# Patient Record
Sex: Male | Born: 1990
Health system: Western US, Academic
[De-identification: ages and names within clinical notes are randomized; demographics above are authoritative.]

## PROBLEM LIST (undated history)

## (undated) DIAGNOSIS — F909 Attention-deficit hyperactivity disorder, unspecified type: Secondary | ICD-10-CM

## (undated) DIAGNOSIS — F32A Depression, unspecified: Secondary | ICD-10-CM

## (undated) DIAGNOSIS — T1491XA Suicide attempt, initial encounter: Secondary | ICD-10-CM

## (undated) DIAGNOSIS — F329 Major depressive disorder, single episode, unspecified: Secondary | ICD-10-CM

## (undated) DIAGNOSIS — F419 Anxiety disorder, unspecified: Secondary | ICD-10-CM

## (undated) HISTORY — PX: DENTAL SURGERY: SHX609

---
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
## 2013-04-11 ENCOUNTER — Encounter (HOSPITAL_COMMUNITY): Payer: Self-pay | Admitting: *Deleted

## 2013-04-11 ENCOUNTER — Emergency Department (HOSPITAL_COMMUNITY): Admission: EM | Admit: 2013-04-11 | Payer: Self-pay | Source: Home / Self Care

## 2013-04-11 ENCOUNTER — Other Ambulatory Visit: Payer: Self-pay

## 2013-04-11 ENCOUNTER — Emergency Department (HOSPITAL_COMMUNITY)
Admission: EM | Admit: 2013-04-11 | Discharge: 2013-04-11 | Disposition: A | Discharge status: Home / Self Care | Payer: 59 | Attending: Emergency Medicine | Admitting: Emergency Medicine

## 2013-04-11 DIAGNOSIS — F17200 Nicotine dependence, unspecified, uncomplicated: Secondary | ICD-10-CM | POA: Insufficient documentation

## 2013-04-11 DIAGNOSIS — T43502A Poisoning by unspecified antipsychotics and neuroleptics, intentional self-harm, initial encounter: Secondary | ICD-10-CM | POA: Insufficient documentation

## 2013-04-11 DIAGNOSIS — T424X4A Poisoning by benzodiazepines, undetermined, initial encounter: Secondary | ICD-10-CM | POA: Insufficient documentation

## 2013-04-11 DIAGNOSIS — T465X1A Poisoning by other antihypertensive drugs, accidental (unintentional), initial encounter: Secondary | ICD-10-CM | POA: Insufficient documentation

## 2013-04-11 DIAGNOSIS — T50992A Poisoning by other drugs, medicaments and biological substances, intentional self-harm, initial encounter: Secondary | ICD-10-CM | POA: Insufficient documentation

## 2013-04-11 DIAGNOSIS — Z79899 Other long term (current) drug therapy: Secondary | ICD-10-CM | POA: Insufficient documentation

## 2013-04-11 DIAGNOSIS — F329 Major depressive disorder, single episode, unspecified: Secondary | ICD-10-CM

## 2013-04-11 DIAGNOSIS — R45851 Suicidal ideations: Secondary | ICD-10-CM

## 2013-04-11 DIAGNOSIS — F3289 Other specified depressive episodes: Secondary | ICD-10-CM | POA: Insufficient documentation

## 2013-04-11 DIAGNOSIS — T50901A Poisoning by unspecified drugs, medicaments and biological substances, accidental (unintentional), initial encounter: Secondary | ICD-10-CM

## 2013-04-11 DIAGNOSIS — F32A Depression, unspecified: Secondary | ICD-10-CM

## 2013-04-11 HISTORY — DX: Suicide attempt, initial encounter: T14.91XA

## 2013-04-11 HISTORY — DX: Attention-deficit hyperactivity disorder, unspecified type: F90.9

## 2013-04-11 HISTORY — DX: Anxiety disorder, unspecified: F41.9

## 2013-04-11 HISTORY — DX: Major depressive disorder, single episode, unspecified: F32.9

## 2013-04-11 HISTORY — DX: Depression, unspecified: F32.A

## 2013-04-11 LAB — COMPREHENSIVE METABOLIC PANEL
ALT: 20 U/L (ref 0–53)
AST: 18 U/L (ref 0–37)
Albumin: 3.4 g/dL — ABNORMAL LOW (ref 3.5–5.2)
Alkaline Phosphatase: 68 U/L (ref 39–117)
Potassium: 3.6 mEq/L (ref 3.5–5.1)
Sodium: 139 mEq/L (ref 135–145)
Total Protein: 6.3 g/dL (ref 6.0–8.3)

## 2013-04-11 LAB — CBC
HCT: 39.5 % (ref 39.0–52.0)
Hemoglobin: 13.8 g/dL (ref 13.0–17.0)
MCH: 31.3 pg (ref 26.0–34.0)
MCHC: 34.9 g/dL (ref 30.0–36.0)
MCV: 89.6 fL (ref 78.0–100.0)
Platelets: 274 K/uL (ref 150–400)
RBC: 4.41 MIL/uL (ref 4.22–5.81)
RDW: 12.5 % (ref 11.5–15.5)
WBC: 6.3 K/uL (ref 4.0–10.5)

## 2013-04-11 LAB — RAPID URINE DRUG SCREEN, HOSP PERFORMED
Amphetamines: NOT DETECTED
Barbiturates: NOT DETECTED
Benzodiazepines: NOT DETECTED
Cocaine: NOT DETECTED
Tetrahydrocannabinol: POSITIVE — AB

## 2013-04-11 LAB — URINALYSIS, ROUTINE W REFLEX MICROSCOPIC
Bilirubin Urine: NEGATIVE
Ketones, ur: NEGATIVE mg/dL
Nitrite: NEGATIVE
pH: 6 (ref 5.0–8.0)

## 2013-04-11 LAB — ACETAMINOPHEN LEVEL: Acetaminophen (Tylenol), Serum: <15 ug/mL (ref 10–30)

## 2013-04-11 MED ORDER — ACETAMINOPHEN 325 MG PO TABS: 650.0000 mg | ORAL_TABLET | ORAL | Status: DC | PRN

## 2013-04-11 MED ORDER — ALBUTEROL SULFATE HFA 108 (90 BASE) MCG/ACT IN AERS: 2.0000 | INHALATION_SPRAY | RESPIRATORY_TRACT | Status: DC | PRN

## 2013-04-11 MED ORDER — IBUPROFEN 200 MG PO TABS: 600.0000 mg | ORAL_TABLET | Freq: Three times a day (TID) | ORAL | Status: DC | PRN

## 2013-04-11 MED ORDER — AMMONIA AROMATIC IN INHA
RESPIRATORY_TRACT | Status: AC
  Administered 2013-04-11: 1 mL
  Filled 2013-04-11: qty 10

## 2013-04-11 MED ORDER — ONDANSETRON HCL 4 MG PO TABS: 4.0000 mg | ORAL_TABLET | Freq: Three times a day (TID) | ORAL | Status: DC | PRN

## 2013-04-11 MED ORDER — NALOXONE HCL 1 MG/ML IJ SOLN
2.0000 mg | Freq: Once | INTRAMUSCULAR | Status: AC
  Administered 2013-04-11: 2 mg via INTRAVENOUS

## 2013-04-11 MED ORDER — NALOXONE HCL 1 MG/ML IJ SOLN
INTRAMUSCULAR | Status: AC
  Filled 2013-04-11: qty 2

## 2013-04-11 MED ORDER — ZOLPIDEM TARTRATE 5 MG PO TABS: 5.0000 mg | ORAL_TABLET | Freq: Every evening | ORAL | Status: DC | PRN

## 2013-04-11 NOTE — Progress Notes (Signed)
Per discussion with psychiatrist,pt psychiatrically stable for discharge home with pt parents. Per psychiatrist patient parents are very supportive and will help to ensure he follows up with Dr. Jennelle Human or the PA. Pt appointment at 11am on Thursday 04/12/2013.   Catha Gosselin, LCSW [phone]  ED CSW .04/11/2013 10:54am

## 2013-04-11 NOTE — ED Notes (Signed)
Bed: AV40 Expected date:  Expected time:  Means of arrival:  Comments: EMS, Overdose, ETOH and Klonipin
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
## 2013-04-11 NOTE — ED Provider Notes (Signed)
CSN: [id number]     Arrival date & time 04/11/13  0439 History   First MD Initiated Contact with Patient 04/11/13 0447     Chief Complaint  Patient presents with  . Drug Overdose  . Suicidal   level V caveat: Altered mental status HPI Patient was brought to the emergency department after he was found to be drinking alcohol this evening it is reported that he took 10 tablets of 0.1 mg clonidine and a total of 2 mg of Klonopin prior to calling EMS.  He is unable to telemetry why he took the clonidine and Klonopin.  Best guess is that the overdose was around 2:30 AM.   Past Medical History  Diagnosis Date  . Depression   . Anxiety   . ADHD (attention deficit hyperactivity disorder)    History reviewed. No pertinent past surgical history. No family history on file. History  Substance Use Topics  . Smoking status: Current Every Day Smoker  . Smokeless tobacco: Not on file  . Alcohol Use: Yes    Review of Systems  Unable to perform ROS: Mental status change    Allergies  Review of patient's allergies indicates no known allergies.  Home Medications   Current Outpatient Rx  Name  Route  Sig  Dispense  Refill  . albuterol (PROVENTIL HFA;VENTOLIN HFA) 108 (90 BASE) MCG/ACT inhaler   Inhalation   Inhale 2 puffs into the lungs every 6 (six) hours as needed. Shortness of breath/ wheezing          BP 103/64  Pulse 71  Temp(Src) 97.7 F (36.5 C) (Oral)  Resp 21  SpO2 97% Physical Exam  Nursing note and vitals reviewed. Constitutional: He is oriented to person, place, and time. He appears well-developed and well-nourished.  HENT:  Head: Normocephalic and atraumatic.  Eyes: EOM are normal.  Neck: Normal range of motion.  Cardiovascular: Normal rate, regular rhythm, normal heart sounds and intact distal pulses.   Pulmonary/Chest: Effort normal and breath sounds normal. No respiratory distress.  Abdominal: Soft. He exhibits no distension. There is no tenderness.   Genitourinary: Rectum normal.  Musculoskeletal: Normal range of motion.  Neurological: He is alert and oriented to person, place, and time.  arousable to ammonia packet  Skin: Skin is warm and dry.    ED Course  Procedures (including critical care time)  ECG interpretation   Date: 04/11/2013  Rate: 71  Rhythm: normal sinus rhythm  QRS Axis: normal  Intervals: normal  ST/T Wave abnormalities: normal  Conduction Disutrbances: none  Narrative Interpretation:   Old EKG Reviewed: no prior ecg     Labs Review Labs Reviewed  COMPREHENSIVE METABOLIC PANEL - Abnormal; Notable for the following:    Glucose, Bld 104 (*)    Albumin 3.4 (*)    Total Bilirubin 0.2 (*)    All other components within normal limits  SALICYLATE LEVEL - Abnormal; Notable for the following:    Salicylate Lvl <2.0 (*)    All other components within normal limits  URINE RAPID DRUG SCREEN (HOSP PERFORMED) - Abnormal; Notable for the following:    Tetrahydrocannabinol POSITIVE (*)    All other components within normal limits  GLUCOSE, CAPILLARY - Abnormal; Notable for the following:    Glucose-Capillary 101 (*)    All other components within normal limits  CBC  ACETAMINOPHEN LEVEL  URINALYSIS, ROUTINE W REFLEX MICROSCOPIC  ETHANOL   Imaging Review No results found.  MDM  No diagnosis found.   6:08 AM  Patient is much more alert now.  Psychiatry evaluation will be necessary given intentional overdose a history of intentional overdose before in the past.  Patient is approximately 4 and half hours status post ingestion.  He'll be watched on the maintenance of emergency department for another hour and a half and then if his blood pressure meds stable he'll be moved to the psychiatric side of our emergency department  Lyanne Co, MD 04/11/13 [phone]

## 2013-04-11 NOTE — ED Provider Notes (Signed)
Pt seen by psychiatry and felt that he can be d/ced home with his parents who will ensure f/u tomorrow with his psychiatrist.  Gwyneth Sprout, MD 04/11/13 1104
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
## 2013-04-11 NOTE — ED Notes (Signed)
Pt escorted to discharge window. Verbalized understanding discharge instructions. In no acute distress. Vitals reviewed and WDL.  

## 2013-04-24 ENCOUNTER — Ambulatory Visit (INDEPENDENT_AMBULATORY_CARE_PROVIDER_SITE_OTHER): Payer: 59 | Admitting: Internal Medicine

## 2013-04-24 VITALS — BP 108/68 | HR 88 | Temp 97.8°F | Resp 18 | Ht 69.5 in | Wt 136.3 lb

## 2013-04-24 DIAGNOSIS — F411 Generalized anxiety disorder: Secondary | ICD-10-CM

## 2013-04-24 DIAGNOSIS — F17200 Nicotine dependence, unspecified, uncomplicated: Secondary | ICD-10-CM

## 2013-04-24 DIAGNOSIS — J069 Acute upper respiratory infection, unspecified: Secondary | ICD-10-CM

## 2013-04-24 MED ORDER — ALBUTEROL SULFATE HFA 108 (90 BASE) MCG/ACT IN AERS: 2.0000 | INHALATION_SPRAY | RESPIRATORY_TRACT | Status: DC | PRN

## 2013-04-24 MED ORDER — FLUTICASONE PROPIONATE 50 MCG/ACT NA SUSP: 2.0000 | Freq: Every day | NASAL | Status: DC

## 2013-04-24 NOTE — Patient Instructions (Addendum)
Continue daily antihistamine Increase fluids Stop smoking. Upper Respiratory Infection, Adult An upper respiratory infection (URI) is also sometimes known as the common cold. The upper respiratory tract includes the nose, sinuses, throat, trachea, and bronchi. Bronchi are the airways leading to the lungs. Most people improve within 1 week, but symptoms can last up to 2 weeks. A residual cough may last even longer.  CAUSES Many different viruses can infect the tissues lining the upper respiratory tract. The tissues become irritated and inflamed and often become very moist. Mucus production is also common. A cold is contagious. You can easily spread the virus to others by oral contact. This includes kissing, sharing a glass, coughing, or sneezing. Touching your mouth or nose and then touching a surface, which is then touched by another person, can also spread the virus. SYMPTOMS  Symptoms typically develop 1 to 3 days after you come in contact with a cold virus. Symptoms vary from person to person. They may include:  Runny nose.  Sneezing.  Nasal congestion.  Sinus irritation.  Sore throat.  Loss of voice (laryngitis).  Cough.  Fatigue.  Muscle aches.  Loss of appetite.  Headache.  Low-grade fever. DIAGNOSIS  You might diagnose your own cold based on familiar symptoms, since most people get a cold 2 to 3 times a year. Your caregiver can confirm this based on your exam. Most importantly, your caregiver can check that your symptoms are not due to another disease such as strep throat, sinusitis, pneumonia, asthma, or epiglottitis. Blood tests, throat tests, and X-rays are not necessary to diagnose a common cold, but they may sometimes be helpful in excluding other more serious diseases. Your caregiver will decide if any further tests are required. RISKS AND COMPLICATIONS  You may be at risk for a more severe case of the common cold if you smoke cigarettes, have chronic heart disease  (such as heart failure) or lung disease (such as asthma), or if you have a weakened immune system. The very young and very old are also at risk for more serious infections. Bacterial sinusitis, middle ear infections, and bacterial pneumonia can complicate the common cold. The common cold can worsen asthma and chronic obstructive pulmonary disease (COPD). Sometimes, these complications can require emergency medical care and may be life-threatening. PREVENTION  The best way to protect against getting a cold is to practice good hygiene. Avoid oral or hand contact with people with cold symptoms. Wash your hands often if contact occurs. There is no clear evidence that vitamin C, vitamin E, echinacea, or exercise reduces the chance of developing a cold. However, it is always recommended to get plenty of rest and practice good nutrition. TREATMENT  Treatment is directed at relieving symptoms. There is no cure. Antibiotics are not effective, because the infection is caused by a virus, not by bacteria. Treatment may include:  Increased fluid intake. Sports drinks offer valuable electrolytes, sugars, and fluids.  Breathing heated mist or steam (vaporizer or shower).  Eating chicken soup or other clear broths, and maintaining good nutrition.  Getting plenty of rest.  Using gargles or lozenges for comfort.  Controlling fevers with ibuprofen or acetaminophen as directed by your caregiver.  Increasing usage of your inhaler if you have asthma. Zinc gel and zinc lozenges, taken in the first 24 hours of the common cold, can shorten the duration and lessen the severity of symptoms. Pain medicines may help with fever, muscle aches, and throat pain. A variety of non-prescription medicines are available to  treat congestion and runny nose. Your caregiver can make recommendations and may suggest nasal or lung inhalers for other symptoms.  HOME CARE INSTRUCTIONS   Only take over-the-counter or prescription medicines  for pain, discomfort, or fever as directed by your caregiver.  Use a warm mist humidifier or inhale steam from a shower to increase air moisture. This may keep secretions moist and make it easier to breathe.  Drink enough water and fluids to keep your urine clear or pale yellow.  Rest as needed.  Return to work when your temperature has returned to normal or as your caregiver advises. You may need to stay home longer to avoid infecting others. You can also use a face mask and careful hand washing to prevent spread of the virus. SEEK MEDICAL CARE IF:   After the first few days, you feel you are getting worse rather than better.  You need your caregiver's advice about medicines to control symptoms.  You develop chills, worsening shortness of breath, or brown or red sputum. These may be signs of pneumonia.  You develop yellow or brown nasal discharge or pain in the face, especially when you bend forward. These may be signs of sinusitis.  You develop a fever, swollen neck glands, pain with swallowing, or white areas in the back of your throat. These may be signs of strep throat. SEEK IMMEDIATE MEDICAL CARE IF:   You have a fever.  You develop severe or persistent headache, ear pain, sinus pain, or chest pain.  You develop wheezing, a prolonged cough, cough up blood, or have a change in your usual mucus (if you have chronic lung disease).  You develop sore muscles or a stiff neck. Document Released: 01/12/2001 Document Revised: 10/11/2011 Document Reviewed: 11/20/2010 [facility] Patient Information 2014 [place], [place]. Smoking Cessation Quitting smoking is important to your health and has many advantages. However, it is not always easy to quit since nicotine is a very addictive drug. Often times, people try 3 times or more before being able to quit. This document explains the best ways for you to prepare to quit smoking. Quitting takes hard work and a lot of effort, but you can do  it. ADVANTAGES OF QUITTING SMOKING  You will live longer, feel better, and live better.  Your body will feel the impact of quitting smoking almost immediately.  Within 20 minutes, blood pressure decreases. Your pulse returns to its normal level.  After 8 hours, carbon monoxide levels in the blood return to normal. Your oxygen level increases.  After 24 hours, the chance of having a heart attack starts to decrease. Your breath, hair, and body stop smelling like smoke.  After 48 hours, damaged nerve endings begin to recover. Your sense of taste and smell improve.  After 72 hours, the body is virtually free of nicotine. Your bronchial tubes relax and breathing becomes easier.  After 2 to 12 weeks, lungs can hold more air. Exercise becomes easier and circulation improves.  The risk of having a heart attack, stroke, cancer, or lung disease is greatly reduced.  After 1 year, the risk of coronary heart disease is cut in half.  After 5 years, the risk of stroke falls to the same as a nonsmoker.  After 10 years, the risk of lung cancer is cut in half and the risk of other cancers decreases significantly.  After 15 years, the risk of coronary heart disease drops, usually to the level of a nonsmoker.  If you are pregnant, quitting smoking will  improve your chances of having a healthy baby.  The people you live with, especially any children, will be healthier.  You will have extra money to spend on things other than cigarettes. QUESTIONS TO THINK ABOUT BEFORE ATTEMPTING TO QUIT You may want to talk about your answers with your caregiver.  Why do you want to quit?  If you tried to quit in the past, what helped and what did not?  What will be the most difficult situations for you after you quit? How will you plan to handle them?  Who can help you through the tough times? Your family? Friends? A caregiver?  What pleasures do you get from smoking? What ways can you still get pleasure if  you quit? Here are some questions to ask your caregiver:  How can you help me to be successful at quitting?  What medicine do you think would be best for me and how should I take it?  What should I do if I need more help?  What is smoking withdrawal like? How can I get information on withdrawal? GET READY  Set a quit date.  Change your environment by getting rid of all cigarettes, ashtrays, matches, and lighters in your home, car, or work. Do not let people smoke in your home.  Review your past attempts to quit. Think about what worked and what did not. GET SUPPORT AND ENCOURAGEMENT You have a better chance of being successful if you have help. You can get support in many ways.  Tell your family, friends, and co-workers that you are going to quit and need their support. Ask them not to smoke around you.  Get individual, group, or telephone counseling and support. Programs are available at [employer] and health centers. Call your local health department for information about programs in your area.  Spiritual beliefs and practices may help some smokers quit.  Download a "quit meter" on your computer to keep track of quit statistics, such as how long you have gone without smoking, cigarettes not smoked, and money saved.  Get a self-help book about quitting smoking and staying off of tobacco. LEARN NEW SKILLS AND BEHAVIORS  Distract yourself from urges to smoke. Talk to someone, go for a walk, or occupy your time with a task.  Change your normal routine. Take a different route to work. Drink tea instead of coffee. Eat breakfast in a different place.  Reduce your stress. Take a hot bath, exercise, or read a book.  Plan something enjoyable to do every day. Reward yourself for not smoking.  Explore interactive web-based programs that specialize in helping you quit. GET MEDICINE AND USE IT CORRECTLY Medicines can help you stop smoking and decrease the urge to smoke. Combining  medicine with the above behavioral methods and support can greatly increase your chances of successfully quitting smoking.  Nicotine replacement therapy helps deliver nicotine to your body without the negative effects and risks of smoking. Nicotine replacement therapy includes nicotine gum, lozenges, inhalers, nasal sprays, and skin patches. Some may be available over-the-counter and others require a prescription.  Antidepressant medicine helps people abstain from smoking, but how this works is unknown. This medicine is available by prescription.  Nicotinic receptor partial agonist medicine simulates the effect of nicotine in your brain. This medicine is available by prescription. Ask your caregiver for advice about which medicines to use and how to use them based on your health history. Your caregiver will tell you what side effects to look out for  if you choose to be on a medicine or therapy. Carefully read the information on the package. Do not use any other product containing nicotine while using a nicotine replacement product.  RELAPSE OR DIFFICULT SITUATIONS Most relapses occur within the first 3 months after quitting. Do not be discouraged if you start smoking again. Remember, most people try several times before finally quitting. You may have symptoms of withdrawal because your body is used to nicotine. You may crave cigarettes, be irritable, feel very hungry, cough often, get headaches, or have difficulty concentrating. The withdrawal symptoms are only temporary. They are strongest when you first quit, but they will go away within 10 14 days. To reduce the chances of relapse, try to:  Avoid drinking alcohol. Drinking lowers your chances of successfully quitting.  Reduce the amount of caffeine you consume. Once you quit smoking, the amount of caffeine in your body increases and can give you symptoms, such as a rapid heartbeat, sweating, and anxiety.  Avoid smokers because they can make you  want to smoke.  Do not let weight gain distract you. Many smokers will gain weight when they quit, usually less than 10 pounds. Eat a healthy diet and stay active. You can always lose the weight gained after you quit.  Find ways to improve your mood other than smoking. FOR MORE INFORMATION  www.smokefree.gov  Document Released: 07/13/2001 Document Revised: 01/18/2012 Document Reviewed: 10/28/2011 [facility] Patient Information 2014 [place], [place].

## 2013-04-24 NOTE — Progress Notes (Signed)
  Subjective:    Patient ID: Ruben [name], male    DOB: Sep 16, 1990, 22 y.o.   MRN: [id number]  HPI Started feeling bad last week with sore throat, cold symptoms, felt better then started feeling bad two days ago with sore throat, worse on left, cough, producing thick, yellow sputum. Congested, no headache. Fatigued, no shortness of breath, occasional wheezing, out of inhaler. No fever. Girlfriend has been sick.  Tried Mucinex without relief.  Ears hurt on inside, no ringing.  Review of Systems Smokes less than pack a day, less with illness. No drugs. Few beers every other night and on weekends.  Feeling a little better on meds for anxiety/depression. Has some anxiety attacks. Able to work. Denies suicidal ideation, extreme sadness, or homicidal ideation.    Objective:   Physical Exam  Constitutional: He is oriented to person, place, and time. He appears well-developed and well-nourished. No distress.  HENT:  Right Ear: Tympanic membrane and external ear normal.  Left Ear: Tympanic membrane and external ear normal.  Nose: Mucosal edema and rhinorrhea present.  Mouth/Throat: Posterior oropharyngeal edema and posterior oropharyngeal erythema present. No oropharyngeal exudate.  Bilateral ear canals with small amount redness inferior, just inside canal.  Neck: Normal range of motion. Neck supple. No thyromegaly present.  Cardiovascular: Normal rate, regular rhythm, normal heart sounds and intact distal pulses.   Pulmonary/Chest: Effort normal and breath sounds normal.  Lymphadenopathy:    He has no cervical adenopathy.  Neurological: He is alert and oriented to person, place, and time.  Skin: Skin is warm and dry.  Psychiatric: He has a normal mood and affect. His behavior is normal. Thought content normal.       Assessment & Plan:  Acute upper respiratory infections of unspecified site  Generalized anxiety disorder  1- URI- discussed diagnosis. Start Flonase 2 sprays in  each nostril, increase fluids, use OTC antihistamines, encouraged smoking cessation.  2- GAD- continue to follow up with psychiatrist. 3- Nicotine addiction- discussed quitting, provided written and verbal support, spent approximately 2 to 3 minutes discussing this.  Participated fully in this evaluation.I have reviewed and agree with documentation. Robert P. Merla Riches, M.D.

## 2013-04-24 NOTE — Progress Notes (Deleted)
  Subjective:    Patient ID: Ruben [name], male    DOB: 08/29/1990, 22 y.o.   MRN: [id number]  HPI    Review of Systems     Objective:   Physical Exam        Assessment & Plan:

## 2013-06-07 ENCOUNTER — Emergency Department (HOSPITAL_COMMUNITY)
Admission: EM | Admit: 2013-06-07 | Discharge: 2013-06-07 | Disposition: A | Discharge status: Home / Self Care | Payer: 59 | Attending: Emergency Medicine | Admitting: Emergency Medicine

## 2013-06-07 ENCOUNTER — Encounter (HOSPITAL_COMMUNITY): Payer: Self-pay | Admitting: Emergency Medicine

## 2013-06-07 DIAGNOSIS — F411 Generalized anxiety disorder: Secondary | ICD-10-CM | POA: Insufficient documentation

## 2013-06-07 DIAGNOSIS — T46901A Poisoning by unspecified agents primarily affecting the cardiovascular system, accidental (unintentional), initial encounter: Secondary | ICD-10-CM | POA: Insufficient documentation

## 2013-06-07 DIAGNOSIS — IMO0002 Reserved for concepts with insufficient information to code with codable children: Secondary | ICD-10-CM | POA: Insufficient documentation

## 2013-06-07 DIAGNOSIS — T50901A Poisoning by unspecified drugs, medicaments and biological substances, accidental (unintentional), initial encounter: Secondary | ICD-10-CM

## 2013-06-07 DIAGNOSIS — T465X1A Poisoning by other antihypertensive drugs, accidental (unintentional), initial encounter: Secondary | ICD-10-CM | POA: Insufficient documentation

## 2013-06-07 DIAGNOSIS — F3289 Other specified depressive episodes: Secondary | ICD-10-CM | POA: Insufficient documentation

## 2013-06-07 DIAGNOSIS — Y929 Unspecified place or not applicable: Secondary | ICD-10-CM | POA: Insufficient documentation

## 2013-06-07 DIAGNOSIS — F329 Major depressive disorder, single episode, unspecified: Secondary | ICD-10-CM | POA: Insufficient documentation

## 2013-06-07 DIAGNOSIS — Y9389 Activity, other specified: Secondary | ICD-10-CM | POA: Insufficient documentation

## 2013-06-07 DIAGNOSIS — F17200 Nicotine dependence, unspecified, uncomplicated: Secondary | ICD-10-CM | POA: Insufficient documentation

## 2013-06-07 DIAGNOSIS — Z79899 Other long term (current) drug therapy: Secondary | ICD-10-CM | POA: Insufficient documentation

## 2013-06-07 LAB — COMPREHENSIVE METABOLIC PANEL
AST: 25 U/L (ref 0–37)
Albumin: 3.8 g/dL (ref 3.5–5.2)
CO2: 23 mEq/L (ref 19–32)
Calcium: 9.3 mg/dL (ref 8.4–10.5)
Creatinine, Ser: 1.01 mg/dL (ref 0.50–1.35)
Potassium: 3.8 mEq/L (ref 3.5–5.1)
Sodium: 140 mEq/L (ref 135–145)
Total Protein: 6.7 g/dL (ref 6.0–8.3)

## 2013-06-07 LAB — RAPID URINE DRUG SCREEN, HOSP PERFORMED
Barbiturates: NOT DETECTED
Benzodiazepines: NOT DETECTED
Cocaine: NOT DETECTED
Tetrahydrocannabinol: POSITIVE — AB

## 2013-06-07 LAB — CBC
HCT: 42.1 % (ref 39.0–52.0)
Hemoglobin: 15 g/dL (ref 13.0–17.0)
RBC: 4.69 MIL/uL (ref 4.22–5.81)

## 2013-06-07 MED ORDER — SODIUM CHLORIDE 0.9 % IV BOLUS (SEPSIS)
1000.0000 mL | Freq: Once | INTRAVENOUS | Status: AC
  Administered 2013-06-07: 1000 mL via INTRAVENOUS

## 2013-06-07 NOTE — ED Notes (Signed)
Charge spoke to [facility], according to notes from Dr Denton Lank pts disposition can be set to discharge.

## 2013-06-07 NOTE — BH Assessment (Signed)
Assessment Note  Ruben [name] is a 22 y.o. male who presents to [facility] after ingesting approx 8-9 clonidine and 3 mg klonopin.  Pt denies SI, no intent or plan to harm self.  Pt says he an intense argument with his ex-girlfriend via texting and he was very upset and had a severe panic attack(SOB, racing thoughts, chest tightness) and took medication to help him calm down. Pt reports he has at least 2 panic attacks, daily.   Pt says he was out with friend at this time and thought he had taken too much and called 911.  Pt has no past SI attempts, no past inpt admissions.  Pt has outpatient services with Dr. Mat Carne Shughert(psych), no therapist.  Pt is able to contract for safety, currently lives with a roommate and has supportive friends.  This Clinical research associate discussed disposition with Dr. Denton Lank, agreed pt could be d/c'd home after appropriate observation due ingestion.  Axis I: Anxiety Disorder NOS and Mood Disorder NOS Axis II: Deferred Axis III:  Past Medical History  Diagnosis Date  . Depression   . Anxiety   . ADHD (attention deficit hyperactivity disorder)   . Suicide attempt    Axis IV: other psychosocial or environmental problems and problems related to social environment Axis V: 41-50 serious symptoms  Past Medical History:  Past Medical History  Diagnosis Date  . Depression   . Anxiety   . ADHD (attention deficit hyperactivity disorder)   . Suicide attempt     History reviewed. No pertinent past surgical history.  Family History:  Family History  Problem Relation Age of Onset  . Hypertension Father   . Cancer Paternal Grandfather     Social History:  reports that he has been smoking.  He does not have any smokeless tobacco history on file. He reports that he drinks alcohol. He reports that he uses illicit drugs (Marijuana).  Additional Social History:  Alcohol / Drug Use Pain Medications: See MAR  Prescriptions: See MAR Over the Counter: See MAR  History of alcohol / drug  use?: Yes Longest period of sobriety (when/how long): None  Negative Consequences of Use: Personal relationships;Work / Programmer, multimedia Withdrawal Symptoms: Other (Comment) (NO w/d sxs ) Substance #1 Name of Substance 1: THC  1 - Age of First Use: Teens 1 - Amount (size/oz): Unk  1 - Frequency: Wkly  1 - Duration: On-going  1 - Last Use / Amount: 1 wk ago   CIWA: CIWA-Ar BP: 95/61 mmHg Pulse Rate: 66 COWS:    Allergies:  Allergies  Allergen Reactions  . Abilify [Aripiprazole] Anaphylaxis    Pt states that medication gave him involuntary muscle spasms    Home Medications:  (Not in a hospital admission)  OB/GYN Status:  No LMP for male patient.  General Assessment Data Location of Assessment: [facility] Is this a Tele or Face-to-Face Assessment?: Tele Assessment Is this an Initial Assessment or a Re-assessment for this encounter?: Initial Assessment Living Arrangements: Other (Comment) (Lives w/roommate ) Can pt return to current living arrangement?: Yes Admission Status: Voluntary Is patient capable of signing voluntary admission?: Yes Transfer from: Acute Hospital Referral Source: MD  Medical Screening Exam [facility] Walk-in ONLY) Medical Exam completed: No Reason for MSE not completed: Other: (None )  [facility] Crisis Care Plan Living Arrangements: Other (Comment) (Lives w/roommate ) Name of Psychiatrist: Dr. Frederik Schmidt Name of Therapist: None   Education Status Is patient currently in school?: No Current Grade: None  Highest grade of school  patient has completed: None  Name of school: None  Contact person: None   Risk to self Suicidal Ideation: No Suicidal Intent: No Is patient at risk for suicide?: No Suicidal Plan?: No Access to Means: No What has been your use of drugs/alcohol within the last 12 months?: Pt uses THC wkly  Previous Attempts/Gestures: No How many times?: 0 Other Self Harm Risks: None  Triggers for Past Attempts: None known Intentional Self Injurious  Behavior: None Family Suicide History: No Recent stressful life event(s): Conflict (Comment) (Argument with girlfriend via text ) Persecutory voices/beliefs?: No Depression: No Depression Symptoms:  (None reported ) Substance abuse history and/or treatment for substance abuse?: No Suicide prevention information given to non-admitted patients: Not applicable  Risk to Others Homicidal Ideation: No Thoughts of Harm to Others: No Current Homicidal Intent: No Current Homicidal Plan: No Access to Homicidal Means: No Identified Victim: None  History of harm to others?: No Assessment of Violence: None Noted Violent Behavior Description: None  Does patient have access to weapons?: No Criminal Charges Pending?: No Does patient have a court date: No  Psychosis Hallucinations: None noted Delusions: None noted  Mental Status Report Appear/Hygiene: Disheveled Eye Contact: Poor Motor Activity: Unremarkable Speech: Logical/coherent;Soft;Slurred Level of Consciousness: Drowsy Mood: Sad Affect: Sad Anxiety Level: None Thought Processes: Coherent;Relevant Judgement: Unimpaired Orientation: Person;Place;Time;Situation Obsessive Compulsive Thoughts/Behaviors: None  Cognitive Functioning Concentration: Normal Memory: Recent Intact;Remote Intact IQ: Average Insight: Fair Impulse Control: Fair Appetite: Good Weight Loss: 0 Weight Gain: 0 Sleep: No Change Total Hours of Sleep: 7 Vegetative Symptoms: None  ADLScreening [facility] Assessment Services) Patient's cognitive ability adequate to safely complete daily activities?: Yes Patient able to express need for assistance with ADLs?: Yes Independently performs ADLs?: Yes (appropriate for developmental age)  Prior Inpatient Therapy Prior Inpatient Therapy: Yes Prior Therapy Dates: 2010 Prior Therapy Facilty/Provider(s): [facility]  Reason for Treatment: Depression   Prior Outpatient Therapy Prior Outpatient Therapy: Yes Prior Therapy Dates:  Current  Prior Therapy Facilty/Provider(s): Dr. Frederik Schmidt  Reason for Treatment: Med Mgt   ADL Screening (condition at time of admission) Patient's cognitive ability adequate to safely complete daily activities?: Yes Is the patient deaf or have difficulty hearing?: No Does the patient have difficulty seeing, even when wearing glasses/contacts?: No Does the patient have difficulty concentrating, remembering, or making decisions?: No Patient able to express need for assistance with ADLs?: Yes Does the patient have difficulty dressing or bathing?: No Independently performs ADLs?: Yes (appropriate for developmental age) Does the patient have difficulty walking or climbing stairs?: No Weakness of Legs: None Weakness of Arms/Hands: None  Home Assistive Devices/Equipment Home Assistive Devices/Equipment: None  Therapy Consults (therapy consults require a physician order) PT Evaluation Needed: No OT Evalulation Needed: No SLP Evaluation Needed: No Abuse/Neglect Assessment (Assessment to be complete while patient is alone) Physical Abuse: Denies Verbal Abuse: Denies Sexual Abuse: Denies Exploitation of patient/patient's resources: Denies Self-Neglect: Denies Values / Beliefs Cultural Requests During Hospitalization: None Spiritual Requests During Hospitalization: None Consults Spiritual Care Consult Needed: No Social Work Consult Needed: No Merchant navy officer (For Healthcare) Advance Directive: Patient does not have advance directive;Patient would not like information Pre-existing out of facility DNR order (yellow form or pink MOST form): No Nutrition Screen- [facility] Patient's home diet: Regular  Additional Information 1:1 In Past 12 Months?: No CIRT Risk: No Elopement Risk: No Does patient have medical clearance?: Yes     Disposition:  Disposition Initial Assessment Completed for this Encounter: Yes Disposition of Patient: Other dispositions (  Pt to be d/c'd  after appropriate observation ) Other disposition(s): Other (Comment) (pt to be d/c'd after appropriate observation by emerg dept )  On Site Evaluation by:   Reviewed with Physician:    Murrell Redden 06/07/2013 7:14 AM

## 2013-06-07 NOTE — ED Notes (Signed)
Poison control called to finish up with pts chart, requested pts last set of vitals. Charge told poison control pt was discharged home.

## 2013-06-07 NOTE — ED Notes (Signed)
Consult to TTS in process at this time.

## 2013-06-07 NOTE — ED Notes (Signed)
Brought in by EMS from home with c/o drug overdose.  Per pt, he has hx of panic attacks and he had one tonight and so, he took 10 tablets of Clonidine 0.1 mg--- states he realized his mistake and called EMS.  Pt reports he took the Clonidine tablets at around 0130.

## 2013-06-07 NOTE — Progress Notes (Signed)
CSW spoke with EDP regarding pt disposition. Per discussion, patient plans to be discharged once awake and eats something. Per EDP, poison control recommended observation for 6 hours. No further psychiatric evaluation needed at this time.   Catha Gosselin, LCSW [phone]  ED CSW .06/07/2013 826am

## 2013-06-07 NOTE — ED Provider Notes (Signed)
7:00 AM Accepted care from Dr. Denton Lank. Poison control recommending obs for 6 hrs.   8:59 AM: Pt has been here for 7 hr and continues to appear well on my exam, BP remains stable. Psych has seen and recommends d/c.  I have discussed the diagnosis/risks/treatment options with the patient and believe the pt to be eligible for discharge home to follow-up with pcp as needed. We also discussed returning to the ED immediately if new or worsening sx occur. We discussed the sx which are most concerning (e.g., SI) that necessitate immediate return. Any new prescriptions provided to the patient are listed below.  Clinical Impression 1. Accidental overdose, initial encounter      Junius Argyle, MD 06/07/13 1526

## 2013-06-07 NOTE — ED Notes (Addendum)
Note written on wrong patient.

## 2013-06-07 NOTE — ED Provider Notes (Addendum)
CSN: [id number]     Arrival date & time 06/07/13  0154 History   First MD Initiated Contact with Patient 06/07/13 0210     Chief Complaint  Patient presents with  . Drug Overdose   (Consider location/radiation/quality/duration/timing/severity/associated sxs/prior Treatment) Patient is a 22 y.o. male presenting with Overdose. The history is provided by the patient.  Drug Overdose Pertinent negatives include no chest pain, no abdominal pain, no headaches and no shortness of breath.  pt states was texting ex girlfriend tonight, when got upset, anxious, notes hx panic attacks, states reached for his med and took 5-6 clonidine, instead of his klonopin. Denies wanting to kill self, but states was anxious and upset. Since ingestion, states feels fine, no faintness or dizziness. Denies any other ingestion. States recent physical health at baseline.     Past Medical History  Diagnosis Date  . Depression   . Anxiety   . ADHD (attention deficit hyperactivity disorder)   . Suicide attempt    History reviewed. No pertinent past surgical history. Family History  Problem Relation Age of Onset  . Hypertension Father   . Cancer Paternal Grandfather    History  Substance Use Topics  . Smoking status: Current Every Day Smoker  . Smokeless tobacco: Not on file  . Alcohol Use: Yes    Review of Systems  Constitutional: Negative for fever.  HENT: Negative for sore throat.   Eyes: Negative for redness.  Respiratory: Negative for shortness of breath.   Cardiovascular: Negative for chest pain.  Gastrointestinal: Negative for abdominal pain.  Genitourinary: Negative for flank pain.  Musculoskeletal: Negative for back pain and neck pain.  Skin: Negative for rash.  Neurological: Negative for syncope, light-headedness and headaches.  Hematological: Does not bruise/bleed easily.  Psychiatric/Behavioral: The patient is nervous/anxious.     Allergies  Abilify and Abilify  Home Medications    Current Outpatient Rx  Name  Route  Sig  Dispense  Refill  . albuterol (PROVENTIL HFA;VENTOLIN HFA) 108 (90 BASE) MCG/ACT inhaler   Inhalation   Inhale 2 puffs into the lungs every 6 (six) hours as needed. Shortness of breath/ wheezing         . albuterol (PROVENTIL HFA;VENTOLIN HFA) 108 (90 BASE) MCG/ACT inhaler   Inhalation   Inhale 2 puffs into the lungs every 4 (four) hours as needed for wheezing (cough, shortness of breath or wheezing.).   1 Inhaler   1   . clonazePAM (KLONOPIN) 1 MG tablet   Oral   Take 1 mg by mouth 3 (three) times daily as needed for anxiety.          . cloNIDine (CATAPRES) 0.1 MG tablet   Oral   Take 0.1 mg by mouth 2 (two) times daily.          . fluticasone (FLONASE) 50 MCG/ACT nasal spray   Nasal   Place 2 sprays into the nose daily.   16 g   6   . PARoxetine (PAXIL) 40 MG tablet   Oral   Take 40 mg by mouth every morning.           BP 110/69  Pulse 104  Temp(Src) 98.1 F (36.7 C) (Oral)  Resp 16  SpO2 97% Physical Exam  Nursing note and vitals reviewed. Constitutional: He is oriented to person, place, and time. He appears well-developed and well-nourished. No distress.  HENT:  Head: Atraumatic.  Eyes: Conjunctivae are normal. Pupils are equal, round, and reactive to light.  Neck: Neck  supple. No tracheal deviation present.  Cardiovascular: Normal rate and normal heart sounds.   Pulmonary/Chest: Effort normal and breath sounds normal. No accessory muscle usage. No respiratory distress.  Abdominal: Soft. He exhibits no distension. There is no tenderness.  Musculoskeletal: Normal range of motion. He exhibits no edema.  Neurological: He is alert and oriented to person, place, and time.  Skin: Skin is warm and dry. He is not diaphoretic.  Psychiatric: He has a normal mood and affect.    ED Course  Procedures (including critical care time)  Results for orders placed during the hospital encounter of 06/07/13  CBC      Result  Value Range   WBC 6.5  4.0 - 10.5 K/uL   RBC 4.69  4.22 - 5.81 MIL/uL   Hemoglobin 15.0  13.0 - 17.0 g/dL   HCT 21.3  08.6 - 57.8 %   MCV 89.8  78.0 - 100.0 fL   MCH 32.0  26.0 - 34.0 pg   MCHC 35.6  30.0 - 36.0 g/dL   RDW 46.9  62.9 - 52.8 %   Platelets 251  150 - 400 K/uL  COMPREHENSIVE METABOLIC PANEL      Result Value Range   Sodium 140  135 - 145 mEq/L   Potassium 3.8  3.5 - 5.1 mEq/L   Chloride 104  96 - 112 mEq/L   CO2 23  19 - 32 mEq/L   Glucose, Bld 95  70 - 99 mg/dL   BUN 10  6 - 23 mg/dL   Creatinine, Ser 4.13  0.50 - 1.35 mg/dL   Calcium 9.3  8.4 - 24.4 mg/dL   Total Protein 6.7  6.0 - 8.3 g/dL   Albumin 3.8  3.5 - 5.2 g/dL   AST 25  0 - 37 U/L   ALT 28  0 - 53 U/L   Alkaline Phosphatase 75  39 - 117 U/L   Total Bilirubin 0.4  0.3 - 1.2 mg/dL   GFR calc non Af Amer >90  >90 mL/min   GFR calc Af Amer >90  >90 mL/min  URINE RAPID DRUG SCREEN (HOSP PERFORMED)      Result Value Range   Opiates NONE DETECTED  NONE DETECTED   Cocaine NONE DETECTED  NONE DETECTED   Benzodiazepines NONE DETECTED  NONE DETECTED   Amphetamines NONE DETECTED  NONE DETECTED   Tetrahydrocannabinol POSITIVE (*) NONE DETECTED   Barbiturates NONE DETECTED  NONE DETECTED  ETHANOL      Result Value Range   Alcohol, Ethyl (B) 148 (*) 0 - 11 mg/dL      EKG Interpretation   None       MDM  Iv ns. Labs.  Discussed w poison control, will obs/monitor vitals for next 6 hours.  Discussed w psych team - will assess.  Pt remains alert, content. No resp dep. bp normal.   Awaiting psych team eval and dispo.   Psych team has evaluated and states no acute psychosis, no delusions or hallucinations.  They indicate pt had no intent to harm or kill self, but had panic attack in response to stressful situation. They indicate pt psychiatrically stable for d/c.   On recheck pt, states he realizes it was stupid to take extra medication/overdose in response to stressful situation, and was intoxicated  at the time. He exhibits normal mood/affect, and denies any wish to harm self or others.   When pt sleeping, and under influence etoh, and bzd, bp transiently low. Pt  easily aroused. Given clonidine ingestion approx 4 hrs earlier, was given iv ns bolus, bp immediately responded.   Will observe in ed to ensure no recurrent hypotensive.  If bp remains normal, and pts psych condition remains stable, anticipate probable d/c later this morning.   Signed out to Dr Romeo Apple to reassess, recheck bp.     Suzi Roots, MD 06/07/13 [phone]

## 2013-10-26 ENCOUNTER — Ambulatory Visit (INDEPENDENT_AMBULATORY_CARE_PROVIDER_SITE_OTHER): Payer: 59 | Admitting: Physician Assistant

## 2013-10-26 VITALS — BP 128/60 | HR 75 | Temp 98.4°F | Resp 16 | Ht 69.0 in | Wt 132.0 lb

## 2013-10-26 DIAGNOSIS — F411 Generalized anxiety disorder: Secondary | ICD-10-CM

## 2013-10-26 DIAGNOSIS — F419 Anxiety disorder, unspecified: Secondary | ICD-10-CM

## 2013-10-26 DIAGNOSIS — J45909 Unspecified asthma, uncomplicated: Secondary | ICD-10-CM

## 2013-10-26 MED ORDER — ALBUTEROL SULFATE HFA 108 (90 BASE) MCG/ACT IN AERS: 2.0000 | INHALATION_SPRAY | RESPIRATORY_TRACT | Status: DC | PRN

## 2013-10-26 MED ORDER — VENLAFAXINE HCL ER 37.5 MG PO CP24: 37.5000 mg | ORAL_CAPSULE | Freq: Every day | ORAL | Status: DC
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
## 2013-11-20 ENCOUNTER — Telehealth: Payer: Self-pay

## 2013-11-20 DIAGNOSIS — F419 Anxiety disorder, unspecified: Secondary | ICD-10-CM

## 2013-11-20 NOTE — Telephone Encounter (Signed)
Glorious PeachAmanda Seavey, pt's psychologist, would like Benny LennertSarah Weber to call her regarding pt. Ruben SagoSarah may try to call anytime during the day, but may find it easier to reach [name] after 5 pm when her appts are finished.

## 2013-11-22 MED ORDER — VENLAFAXINE HCL ER 75 MG PO CP24: 75.0000 mg | ORAL_CAPSULE | Freq: Every day | ORAL | Status: DC

## 2013-11-22 MED ORDER — HYDROXYZINE HCL 25 MG PO TABS: 12.5000 mg | ORAL_TABLET | Freq: Two times a day (BID) | ORAL | Status: DC | PRN

## 2013-11-22 NOTE — Telephone Encounter (Signed)
I spoke with Glorious PeachAmanda Seavey and we will increase the patients Effexor dose (he is tolerating it well but not seeing an improvement in his anxiety).  We will also try Atarax for his break through anxiety.  meds were sent to the pharmacy.

## 2013-12-20 ENCOUNTER — Telehealth: Payer: Self-pay

## 2013-12-20 DIAGNOSIS — F419 Anxiety disorder, unspecified: Secondary | ICD-10-CM

## 2013-12-20 NOTE — Telephone Encounter (Signed)
Florene RouteSarah, Amanda from [facility] wants to know if you wouldn't mind giving her a call at your earliest convenience for a consult to discuss medications on this patient please. Thanks

## 2013-12-26 NOTE — Telephone Encounter (Signed)
Called and LMOM of Dr Alycia Rossetti.

## 2013-12-27 MED ORDER — CLONIDINE HCL 0.1 MG PO TABS: 0.1000 mg | ORAL_TABLET | Freq: Every evening | ORAL | Status: DC | PRN

## 2013-12-27 MED ORDER — VENLAFAXINE HCL ER 150 MG PO CP24: 150.0000 mg | ORAL_CAPSULE | Freq: Every day | ORAL | Status: DC

## 2013-12-27 NOTE — Telephone Encounter (Signed)
Marchelle Folks called to say that pt agreeable to medication changes

## 2013-12-27 NOTE — Telephone Encounter (Signed)
Spoke with Ruben [name].  Patient is still having intense anxiety that has not changed since starting the effexor.  He is still not sleeping at night due to can not turn off his thoughts.  He is partially compliant with therapy.  He will need an OV here before more refills.

## 2014-01-09 ENCOUNTER — Ambulatory Visit (INDEPENDENT_AMBULATORY_CARE_PROVIDER_SITE_OTHER): Payer: 59 | Admitting: Family Medicine

## 2014-01-09 VITALS — BP 118/70 | HR 115 | Temp 98.9°F | Resp 16 | Ht 69.0 in | Wt 142.0 lb

## 2014-01-09 DIAGNOSIS — J029 Acute pharyngitis, unspecified: Secondary | ICD-10-CM

## 2014-01-09 DIAGNOSIS — R059 Cough, unspecified: Secondary | ICD-10-CM

## 2014-01-09 DIAGNOSIS — R05 Cough: Secondary | ICD-10-CM

## 2014-01-09 LAB — POCT RAPID STREP A (OFFICE): RAPID STREP A SCREEN: NEGATIVE

## 2014-01-09 MED ORDER — PROMETHAZINE-DM 6.25-15 MG/5ML PO SYRP: 2.5000 mL | ORAL_SOLUTION | Freq: Four times a day (QID) | ORAL | Status: DC | PRN

## 2014-01-09 NOTE — Patient Instructions (Signed)
Cough syrup as prescribed- will make you sleepy. Increase fluids Use albuterol inhaler every 2 hours as needed while sick (for up to 24 hours) Can use afrin per package directions for up to 3 days Return if no improvement in 4-5 days or if worsening symptoms

## 2014-01-09 NOTE — Progress Notes (Signed)
   Subjective:    Patient ID: Ruben [name], male    DOB: [birth year], 23 y.o.   MRN: [id number]  HPI Patient presents today with sore throat and cough since yesterday. He has felt fatigued with some joint pain.Thick yellow-white nasal drainage and sputum. Has albuterol inhaler, has been using every 6 hours without relief. Prior to illness, he rarely uses inhaler. Has taken allegra without relief. Has some viscous lidocaine at home that helps some with throat pain.  Smokes a couple of cigarettes a day. Has had a sick contact- friend.  Is seeing a psychiatrist for his anxiety and feels this is helpful. Is seeing Anne Fu and a therapist Glorious Peach.  Review of Systems No fever, feels hot, no headache, no ear pain, no wheezing, feels short of breath, occasionally has chest heaviness- he has this with anxiety on occasion.    Objective:   Physical Exam  Vitals reviewed. Constitutional: He is oriented to person, place, and time. He appears well-developed and well-nourished. No distress.  Appears to not feel well, very frequent coughing.   HENT:  Head: Normocephalic and atraumatic.  Right Ear: Tympanic membrane, external ear and ear canal normal.  Left Ear: Tympanic membrane, external ear and ear canal normal.  Nose: Mucosal edema and rhinorrhea present. Right sinus exhibits no maxillary sinus tenderness and no frontal sinus tenderness. Left sinus exhibits no maxillary sinus tenderness and no frontal sinus tenderness.  Mouth/Throat: Posterior oropharyngeal edema and posterior oropharyngeal erythema present. No tonsillar abscesses. Oropharyngeal exudate: +2 tonsils.  Eyes: Conjunctivae are normal. Right eye exhibits no discharge. Left eye exhibits no discharge. No scleral icterus.  Neck: Normal range of motion. Neck supple.  Cardiovascular: Regular rhythm and normal heart sounds.  Tachycardia present.   Pulmonary/Chest: Effort normal and breath sounds normal.  Musculoskeletal: Normal  range of motion.  Lymphadenopathy:    He has no cervical adenopathy.  Neurological: He is alert and oriented to person, place, and time.  Skin: Skin is warm and dry. He is not diaphoretic.  Psychiatric: He has a normal mood and affect. His behavior is normal. Judgment and thought content normal.   Results for orders placed in visit on 01/09/14  POCT RAPID STREP A (OFFICE)      Result Value Ref Range   Rapid Strep A Screen Negative  Negative       Assessment & Plan:  1. Acute pharyngitis - POCT rapid strep A - Culture, Group A Strep  2. Cough - promethazine-dextromethorphan (PROMETHAZINE-DM) 6.25-15 MG/5ML syrup; Take 2.5-5 mLs by mouth 4 (four) times daily as needed for cough.  Dispense: 120 mL; Refill: 0 - Patient Instructions  Cough syrup as prescribed- will make you sleepy. Increase fluids Use albuterol inhaler every 2 hours as needed while sick (for up to 24 hours) Can use afrin per package directions for up to 3 days Return if no improvement in 4-5 days or if worsening symptoms   Emi Belfast, FNP-BC  [facility]  01/09/2014 8:35 PM

## 2014-01-10 ENCOUNTER — Telehealth: Payer: Self-pay

## 2014-01-10 DIAGNOSIS — R05 Cough: Secondary | ICD-10-CM

## 2014-01-10 DIAGNOSIS — R059 Cough, unspecified: Secondary | ICD-10-CM

## 2014-01-10 NOTE — Telephone Encounter (Signed)
Patient called and states he is not feeling any better since his visit yesterday. Please return call and advise.

## 2014-01-11 MED ORDER — HYDROCODONE-HOMATROPINE 5-1.5 MG/5ML PO SYRP: ORAL_SOLUTION | ORAL | Status: DC

## 2014-01-11 NOTE — Telephone Encounter (Signed)
I have written for Hycodan. Do not take with Phenergan DM.

## 2014-01-11 NOTE — Telephone Encounter (Signed)
Pt's cough is worse and he is not sleeping. Is there anything else that we can prescribe besides Phenergan DM?

## 2014-01-11 NOTE — Telephone Encounter (Signed)
Pt notified that this is ready for p/u and given precautions.

## 2014-01-12 LAB — CULTURE, GROUP A STREP: ORGANISM ID, BACTERIA: NORMAL

## 2014-01-16 ENCOUNTER — Ambulatory Visit (INDEPENDENT_AMBULATORY_CARE_PROVIDER_SITE_OTHER): Payer: 59 | Admitting: Physician Assistant

## 2014-01-16 VITALS — HR 104 | Temp 99.0°F | Ht 69.0 in | Wt 142.0 lb

## 2014-01-16 DIAGNOSIS — R059 Cough, unspecified: Secondary | ICD-10-CM

## 2014-01-16 DIAGNOSIS — J329 Chronic sinusitis, unspecified: Secondary | ICD-10-CM

## 2014-01-16 DIAGNOSIS — J029 Acute pharyngitis, unspecified: Secondary | ICD-10-CM

## 2014-01-16 DIAGNOSIS — R05 Cough: Secondary | ICD-10-CM

## 2014-01-16 LAB — POCT CBC
Granulocyte percent: 76.3 %G (ref 37–80)
HCT, POC: 42.5 % — AB (ref 43.5–53.7)
Hemoglobin: 13.8 g/dL — AB (ref 14.1–18.1)
Lymph, poc: 1.8 (ref 0.6–3.4)
MCH, POC: 30.1 pg (ref 27–31.2)
MCHC: 32.5 g/dL (ref 31.8–35.4)
MCV: 92.6 fL (ref 80–97)
MID (cbc): 0.7 (ref 0–0.9)
MPV: 8.9 fL (ref 0–99.8)
POC Granulocyte: 7.9 — AB (ref 2–6.9)
POC LYMPH PERCENT: 17 %L (ref 10–50)
POC MID %: 6.7 %M (ref 0–12)
Platelet Count, POC: 392 10*3/uL (ref 142–424)
RBC: 4.59 M/uL — AB (ref 4.69–6.13)
RDW, POC: 13.1 %
WBC: 10.3 10*3/uL — AB (ref 4.6–10.2)

## 2014-01-16 MED ORDER — HYDROCODONE-HOMATROPINE 5-1.5 MG/5ML PO SYRP: 5.0000 mL | ORAL_SOLUTION | Freq: Three times a day (TID) | ORAL | Status: DC | PRN

## 2014-01-16 MED ORDER — AMOXICILLIN-POT CLAVULANATE 875-125 MG PO TABS: 1.0000 | ORAL_TABLET | Freq: Two times a day (BID) | ORAL | Status: DC

## 2014-01-16 NOTE — Progress Notes (Signed)
Subjective:    Patient ID: Ruben [name], male    DOB: 03/24/1991, 23 y.o.   MRN: [id number]  HPI 23 year old male presents for recheck of illness. Seen initially on 6/12 for evaluation of 1 day history of sore throat and cough.  He had a negative rapid strep and subsequent negative throat culture.  He has been taking Hycodan cough syrup at night as well as using Mucinex-DM and ibuprofen. States the pharmacy did not give him a measuring cup so he did not know how much cough syrup to take. Admits he is almost out of it. He has continued to get worse.  Complains of nasal congestion, sinus pain/pressure, cough, and wheezing.  Has also been using his albuterol inhaler.  Also has otalgia (L>R).   Hx of tonsillitis in the past. No known strep contacts.  Unsure if he has had mono or not.   Patient has anxiety disorder and is currently seeing therapist.  He reports extreme anxiety to needle/venipuncture.   Review of Systems  Constitutional: Positive for fever and chills.  HENT: Positive for congestion, ear pain, postnasal drip, rhinorrhea, sinus pressure and sore throat.   Respiratory: Positive for cough. Negative for shortness of breath and wheezing.   Gastrointestinal: Negative for nausea, vomiting and abdominal pain.       Objective:   Physical Exam  Constitutional: He is oriented to person, place, and time. He appears well-developed and well-nourished.  HENT:  Head: Normocephalic and atraumatic.  Right Ear: Hearing, external ear and ear canal normal. Tympanic membrane is erythematous.  Left Ear: Hearing, external ear and ear canal normal. Tympanic membrane is erythematous.  Mouth/Throat: Uvula is midline and mucous membranes are normal. Posterior oropharyngeal erythema (2+ tonsillar swelling) present. No oropharyngeal exudate, posterior oropharyngeal edema or tonsillar abscesses.  Eyes: Conjunctivae are normal.  Neck: Normal range of motion. Neck supple.  Cardiovascular: Normal rate,  regular rhythm and normal heart sounds.   Pulmonary/Chest: Effort normal and breath sounds normal.  Lymphadenopathy:    He has cervical adenopathy.  Neurological: He is alert and oriented to person, place, and time.  Psychiatric: He has a normal mood and affect. His behavior is normal. Judgment and thought content normal.    Results for orders placed in visit on 01/16/14  POCT CBC      Result Value Ref Range   WBC 10.3 (*) 4.6 - 10.2 K/uL   Lymph, poc 1.8  0.6 - 3.4   POC LYMPH PERCENT 17.0  10 - 50 %L   MID (cbc) 0.7  0 - 0.9   POC MID % 6.7  0 - 12 %M   POC Granulocyte 7.9 (*) 2 - 6.9   Granulocyte percent 76.3  37 - 80 %G   RBC 4.59 (*) 4.69 - 6.13 M/uL   Hemoglobin 13.8 (*) 14.1 - 18.1 g/dL   HCT, POC 40.942.5 (*) 81.143.5 - 53.7 %   MCV 92.6  80 - 97 fL   MCH, POC 30.1  27 - 31.2 pg   MCHC 32.5  31.8 - 35.4 g/dL   RDW, POC 91.413.1     Platelet Count, POC 392  142 - 424 K/uL   MPV 8.9  0 - 99.8 fL         Assessment & Plan:  Acute pharyngitis - Plan: POCT CBC  Sinusitis - Plan: amoxicillin-clavulanate (AUGMENTIN) 875-125 MG per tablet  Cough - Plan: HYDROcodone-homatropine (HYCODAN) 5-1.5 MG/5ML syrup  Will treat with Augmentin 875 mg  bid x 10 days. I did agree to refill his Hycodan for him to take only every 8 hours as needed - I gave him a measuring cup from the office and showed him how much to take.  No more refills without recheck. Instructed him to continue Mucinex-DM and ibuprofen or tylenol as needed for fever/chills. RTC precautions discussed.

## 2014-01-18 ENCOUNTER — Telehealth: Payer: Self-pay | Admitting: Physician Assistant

## 2014-01-18 NOTE — Telephone Encounter (Signed)
Patient called and states that he still has symptoms of a fever of 105 degrees, sore throat, etc. Advised patient to return to clinic. Patient states he would rather wait for a return phone call. Patient also states that he noticed a lump under the skin on his chest and he is worried that it may be cancer (patient also explained that he has anxiety). Again, I reminded patient that it is best that he come in for an OV to have the lump evaluated since we cannot diagnose over the telephone.   [phone]

## 2014-01-19 NOTE — Telephone Encounter (Signed)
Patient called back to say that he does not have a fever of 105- he got a voicemail from [employer]. He says he was misunderstood,he actually has a fever of 100.5.  He says he will not go to emergency room. He will finish his antibiotics.He sounded really out of it. He said he had to catch his breath. I told him I'd let sara know that it was not a fever of 105.

## 2014-01-19 NOTE — Telephone Encounter (Signed)
Lm on cell phone- pt needs to RTC or go to the ED.

## 2014-02-06 ENCOUNTER — Emergency Department (HOSPITAL_COMMUNITY)
Admission: EM | Admit: 2014-02-06 | Discharge: 2014-02-06 | Disposition: A | Discharge status: Home / Self Care | Payer: 59 | Attending: Emergency Medicine | Admitting: Emergency Medicine

## 2014-02-06 DIAGNOSIS — F1410 Cocaine abuse, uncomplicated: Secondary | ICD-10-CM | POA: Insufficient documentation

## 2014-02-06 DIAGNOSIS — T43501A Poisoning by unspecified antipsychotics and neuroleptics, accidental (unintentional), initial encounter: Secondary | ICD-10-CM | POA: Insufficient documentation

## 2014-02-06 DIAGNOSIS — Y9389 Activity, other specified: Secondary | ICD-10-CM | POA: Insufficient documentation

## 2014-02-06 DIAGNOSIS — F1490 Cocaine use, unspecified, uncomplicated: Secondary | ICD-10-CM

## 2014-02-06 DIAGNOSIS — Y9289 Other specified places as the place of occurrence of the external cause: Secondary | ICD-10-CM | POA: Insufficient documentation

## 2014-02-06 DIAGNOSIS — S6990XA Unspecified injury of unspecified wrist, hand and finger(s), initial encounter: Secondary | ICD-10-CM | POA: Insufficient documentation

## 2014-02-06 DIAGNOSIS — F17200 Nicotine dependence, unspecified, uncomplicated: Secondary | ICD-10-CM | POA: Insufficient documentation

## 2014-02-06 DIAGNOSIS — F329 Major depressive disorder, single episode, unspecified: Secondary | ICD-10-CM | POA: Insufficient documentation

## 2014-02-06 DIAGNOSIS — T50904A Poisoning by unspecified drugs, medicaments and biological substances, undetermined, initial encounter: Secondary | ICD-10-CM

## 2014-02-06 DIAGNOSIS — F909 Attention-deficit hyperactivity disorder, unspecified type: Secondary | ICD-10-CM | POA: Insufficient documentation

## 2014-02-06 DIAGNOSIS — F411 Generalized anxiety disorder: Secondary | ICD-10-CM | POA: Insufficient documentation

## 2014-02-06 DIAGNOSIS — F419 Anxiety disorder, unspecified: Secondary | ICD-10-CM

## 2014-02-06 DIAGNOSIS — Z79899 Other long term (current) drug therapy: Secondary | ICD-10-CM | POA: Insufficient documentation

## 2014-02-06 DIAGNOSIS — T43591A Poisoning by other antipsychotics and neuroleptics, accidental (unintentional), initial encounter: Secondary | ICD-10-CM | POA: Insufficient documentation

## 2014-02-06 DIAGNOSIS — F3289 Other specified depressive episodes: Secondary | ICD-10-CM | POA: Insufficient documentation

## 2014-02-06 LAB — CBC
HEMATOCRIT: 42.9 % (ref 39.0–52.0)
HEMOGLOBIN: 15.4 g/dL (ref 13.0–17.0)
MCH: 31.2 pg (ref 26.0–34.0)
MCHC: 35.9 g/dL (ref 30.0–36.0)
MCV: 87 fL (ref 78.0–100.0)
Platelets: 256 10*3/uL (ref 150–400)
RBC: 4.93 MIL/uL (ref 4.22–5.81)
RDW: 12.9 % (ref 11.5–15.5)
WBC: 6.9 10*3/uL (ref 4.0–10.5)

## 2014-02-06 LAB — COMPREHENSIVE METABOLIC PANEL
ALT: 28 U/L (ref 0–53)
ANION GAP: 15 (ref 5–15)
AST: 26 U/L (ref 0–37)
Albumin: 3.8 g/dL (ref 3.5–5.2)
Alkaline Phosphatase: 88 U/L (ref 39–117)
BUN: 10 mg/dL (ref 6–23)
CO2: 21 mEq/L (ref 19–32)
Calcium: 9.4 mg/dL (ref 8.4–10.5)
Chloride: 101 mEq/L (ref 96–112)
Creatinine, Ser: 1.13 mg/dL (ref 0.50–1.35)
GFR calc non Af Amer: >90 mL/min (ref >90)
GLUCOSE: 100 mg/dL — AB (ref 70–99)
Potassium: 3.8 mEq/L (ref 3.7–5.3)
Sodium: 137 mEq/L (ref 137–147)
TOTAL PROTEIN: 7.1 g/dL (ref 6.0–8.3)
Total Bilirubin: 0.4 mg/dL (ref 0.3–1.2)

## 2014-02-06 LAB — ACETAMINOPHEN LEVEL

## 2014-02-06 LAB — RAPID URINE DRUG SCREEN, HOSP PERFORMED
Amphetamines: NOT DETECTED
Barbiturates: NOT DETECTED
Benzodiazepines: POSITIVE — AB
Cocaine: POSITIVE — AB
Opiates: NOT DETECTED
Tetrahydrocannabinol: POSITIVE — AB

## 2014-02-06 LAB — MAGNESIUM: MAGNESIUM: 2.1 mg/dL (ref 1.5–2.5)

## 2014-02-06 LAB — ETHANOL: Alcohol, Ethyl (B): <11 mg/dL (ref 0–11)

## 2014-02-06 LAB — SALICYLATE LEVEL

## 2014-02-06 NOTE — ED Notes (Signed)
Poison control called, recommend checking potassium and magnesium as well as doing an EKG; seroquil can cause prolonged QTC interval. If immediate release, peak is 1.5 hrs after taken. Expect pt to be drowsy/ lethargic.

## 2014-02-06 NOTE — ED Notes (Signed)
Per EMS: Pt has hx of anxiety and PTSD. Was smoking marijuana tonight when he started to freak out. Took 4 mg klonopine and 1200 mg seroquil. Pt has also been drinking alcohol. Pt denies thoughts of hurting himself; was just feeling anxious and wanted to feel better. A&O x 4, drowsy, large pupils.

## 2014-02-06 NOTE — ED Provider Notes (Signed)
CSN: [id number]     Arrival date & time 02/06/14  0152 History   First MD Initiated Contact with Patient 02/06/14 0350     Chief Complaint  Patient presents with  . Anxiety  . Drug Overdose     HPI  Vision presents with a panic attack. States it's "someone" gave him some "weed to smoke". He states he smoked it. States he felt considerably different. He got very anxious and ran into his car. He called 911.  He took 4, 1 mg Klonopin. He took 4, 300 mg Seroquel. He states he feels "sleepy" now.  No seizures. No chest pain. No agitation.  Past Medical History  Diagnosis Date  . Depression   . Anxiety   . ADHD (attention deficit hyperactivity disorder)   . Suicide attempt    No past surgical history on file. Family History  Problem Relation Age of Onset  . Hypertension Father   . Cancer Paternal Grandfather    History  Substance Use Topics  . Smoking status: Current Every Day Smoker -- 1.00 packs/day  . Smokeless tobacco: Never Used  . Alcohol Use: Yes    Review of Systems  Constitutional: Negative for fever, chills, diaphoresis, appetite change and fatigue.  HENT: Negative for mouth sores, sore throat and trouble swallowing.   Eyes: Negative for visual disturbance.  Respiratory: Negative for cough, chest tightness, shortness of breath and wheezing.   Cardiovascular: Negative for chest pain.  Gastrointestinal: Negative for nausea, vomiting, abdominal pain, diarrhea and abdominal distention.  Endocrine: Negative for polydipsia, polyphagia and polyuria.  Genitourinary: Negative for dysuria, frequency and hematuria.  Musculoskeletal: Negative for gait problem.  Skin: Negative for color change, pallor and rash.  Neurological: Negative for dizziness, syncope, light-headedness and headaches.  Hematological: Does not bruise/bleed easily.  Psychiatric/Behavioral: Negative for behavioral problems and confusion. The patient is nervous/anxious.       Allergies  Abilify  Home  Medications   Prior to Admission medications   Medication Sig Start Date End Date Taking? Authorizing Provider  albuterol (PROVENTIL HFA;VENTOLIN HFA) 108 (90 BASE) MCG/ACT inhaler Inhale 2 puffs into the lungs every 4 (four) hours as needed for wheezing (cough, shortness of breath or wheezing.). 10/26/13  Yes Morrell RiddleSarah L Weber, PA-C  clonazePAM (KLONOPIN) 1 MG tablet Take 1 tablet by mouth 2 (two) times daily as needed. anxiety 02/04/14  Yes Historical Provider, MD  guaiFENesin (MUCINEX) 600 MG 12 hr tablet Take 600 mg by mouth 2 (two) times daily as needed for to loosen phlegm.   Yes Historical Provider, MD  HYDROcodone-homatropine (HYCODAN) 5-1.5 MG/5ML syrup Take 5 mLs by mouth every 8 (eight) hours as needed for cough. 01/16/14  Yes Heather M Marte, PA-C  ibuprofen (ADVIL,MOTRIN) 200 MG tablet Take 400 mg by mouth every 6 (six) hours as needed for moderate pain.   Yes Historical Provider, MD  promethazine-dextromethorphan (PROMETHAZINE-DM) 6.25-15 MG/5ML syrup Take 2.5-5 mLs by mouth 4 (four) times daily as needed for cough. 01/09/14  Yes Emi Belfasteborah B Gessner, FNP  QUEtiapine Fumarate (SEROQUEL PO) Take 1 tablet by mouth at bedtime.   Yes Historical Provider, MD  venlafaxine XR (EFFEXOR-XR) 150 MG 24 hr capsule Take 1 capsule (150 mg total) by mouth daily with breakfast. 12/27/13  Yes Morrell RiddleSarah L Weber, PA-C   BP 108/55  Pulse 92  Temp(Src) 98.2 F (36.8 C) (Oral)  Resp 18  SpO2 95% Physical Exam  Constitutional: He is oriented to person, place, and time. He appears well-developed and well-nourished. No  distress.  HENT:  Head: Normocephalic.  Eyes: Conjunctivae are normal. Pupils are equal, round, and reactive to light. No scleral icterus.  Neck: Normal range of motion. Neck supple. No thyromegaly present.  Cardiovascular: Normal rate and regular rhythm.  Exam reveals no gallop and no friction rub.   No murmur heard. Pulmonary/Chest: Effort normal and breath sounds normal. No respiratory distress. He  has no wheezes. He has no rales.  Abdominal: Soft. Bowel sounds are normal. He exhibits no distension. There is no tenderness. There is no rebound.  Musculoskeletal: Normal range of motion.  Neurological: He is alert and oriented to person, place, and time.  Skin: Skin is warm and dry. No rash noted.  Psychiatric: He has a normal mood and affect. His behavior is normal.  Calm    ED Course  Procedures (including critical care time) Labs Review Labs Reviewed  COMPREHENSIVE METABOLIC PANEL - Abnormal; Notable for the following:    Glucose, Bld 100 (*)    All other components within normal limits  SALICYLATE LEVEL - Abnormal; Notable for the following:    Salicylate Lvl <2.0 (*)    All other components within normal limits  URINE RAPID DRUG SCREEN (HOSP PERFORMED) - Abnormal; Notable for the following:    Cocaine POSITIVE (*)    Benzodiazepines POSITIVE (*)    Tetrahydrocannabinol POSITIVE (*)    All other components within normal limits  CBC  ETHANOL  ACETAMINOPHEN LEVEL  MAGNESIUM    Imaging Review No results found.   EKG Interpretation   Date/Time:  Wednesday February 06 2014 02:34:37 EDT Ventricular Rate:  89 PR Interval:  171 QRS Duration: 108 QT Interval:  367 QTC Calculation: 446 R Axis:   103 Text Interpretation:  Sinus rhythm Consider right atrial enlargement  Borderline right axis deviation  early repol pattern Confirmed by Fayrene FearingJAMES   MD, Liyanna Cartwright ([id number]) on 02/06/2014 3:49:04 AM      MDM   Final diagnoses:  Cocaine use  Anxiety  Overdose, undetermined intent, initial encounter    His EKG does not show ischemia. Is not show QT changes. He is now greater than 6 hours post his is sleeping but awakens easily. Is able to walk independently and is stable and his feet. Toxicology positive for cocaine. Plan to use cocaine. Property therapist regarding her anxiety, which would be considerably better if you do not use cocaine. Take your medicines only as  prescribed    Rolland PorterMark Lekeisha Arenas, MD 02/06/14 [id number]

## 2014-02-06 NOTE — ED Notes (Signed)
EKG given to EDP,James, MD., for review. 

## 2014-02-06 NOTE — ED Notes (Signed)
Bed: [id number] Expected date:  Expected time:  Means of arrival:  Comments: EMS 22yo anxiety attack, overdose

## 2014-02-06 NOTE — Discharge Instructions (Signed)
Continue your current medicines as prescribed only. Avoid cocaine. Follow up with your therapist regarding your anxiety attacks.  Panic Attacks Panic attacks are sudden, short feelings of great fear or discomfort. You may have them for no reason when you are relaxed, when you are uneasy (anxious), or when you are sleeping.  HOME CARE  Take all your medicines as told.  Check with your doctor before starting new medicines.  Keep all doctor visits. GET HELP IF:  You are not able to take your medicines as told.  Your symptoms do not get better.  Your symptoms get worse. GET HELP RIGHT AWAY IF:  Your attacks seem different than your normal attacks.  You have thoughts about hurting yourself or others.  You take panic attack medicine and you have a side effect. MAKE SURE YOU:  Understand these instructions.  Will watch your condition.  Will get help right away if you are not doing well or get worse. Document Released: 08/21/2010 Document Revised: 05/09/2013 Document Reviewed: 03/02/2013 [facility] Patient Information 2015 [place], [place]. This information is not intended to replace advice given to you by your health care provider. Make sure you discuss any questions you have with your health care provider.

## 2018-08-27 DIAGNOSIS — R079 Chest pain, unspecified: Secondary | ICD-10-CM

## 2018-08-28 ENCOUNTER — Inpatient Hospital Stay
Admit: 2018-08-28 | Discharge: 2018-08-28 | Disposition: A | Discharge status: Home / Self Care | Payer: PRIVATE HEALTH INSURANCE | Source: Home / Self Care

## 2018-08-28 MED ADMIN — KETOROLAC TROMETHAMINE 30 MG/ML IJ SOLN: 30 mg | INTRAVENOUS | @ 03:00:00 | Stop: 2018-08-28

## 2018-08-28 MED ADMIN — ONDANSETRON HCL 4 MG/2ML IJ SOLN: 4 mg | INTRAVENOUS | @ 03:00:00 | Stop: 2018-08-28

## 2018-08-28 MED ADMIN — SODIUM CHLORIDE 0.9 % IV BOLUS: 1000 mL | INTRAVENOUS | @ 03:00:00 | Stop: 2018-08-28

## 2018-08-28 NOTE — ED Provider Notes
[facility]  Emergency Department Service Report    Luke Mendez 28 y.o. male , presents with Near Syncope and Chest Pain      Triage   Arrived on 08/27/2018 at 5:43 PM   Arrived by Walk-in [14]    ED Triage Vitals   Temp Temp Source BP Heart Rate Resp SpO2 O2 Device Pain Score Weight   08/27/18 1750 08/27/18 1750 08/27/18 1750 08/27/18 1750 08/27/18 1750 08/27/18 1750 08/27/18 1845 08/27/18 1805 --   36.9 ???C (98.4 ???F) Oral 119/81 (!) 110 16 100 % None (Room air) Five        Pre hospital care:       No Known Allergies    History   Patient is a 28 y.o. male with hx of anxiety and depression who presents to the ED with complaint of gradual onset of chest pain that started 2 days ago. Sx are mild, intermittent, with no exacerbating or alleviating factors. Patient thinks he had a possible syncopal episode today while in the car. States he was with a friend but the friend is not sure if pt had a syncopal episode. Denies alcohol use today. Reports smoking marijuana 12 hours ago. Reports he thinks his sx may be related to his anxiety and is taking ativan.     The history is provided by the patient. No language interpreter was used.   Chest Pain   The chest pain began 2 days ago. Chest pain occurs intermittently. The chest pain is unchanged. Associated with: unknown. The severity of the chest pain is mild. The pain does not radiate. He tried nothing for the symptoms.              Past Medical History:   Diagnosis Date   ??? Anxiety    ??? Depression         History reviewed. No pertinent surgical history.     Past Family History   Family history reviewed by me and there is no pertinent past family history related to patient's current case and/or care.      Past Social History   he reports that he has been smoking. He has never used smokeless tobacco. He reports current alcohol use. He reports current drug use. Drug: Marijuana. No history on file for sexual activity.     Review of Systems Cardiovascular: Positive for chest pain.   Neurological: Positive for syncope (possible syncope).   All other systems reviewed and are negative.      Physical Exam   Physical Exam  Vitals signs and nursing note reviewed.   Constitutional:       Appearance: Normal appearance.   HENT:      Head: Normocephalic.      Mouth/Throat:      Mouth: Mucous membranes are moist.   Eyes:      Extraocular Movements: Extraocular movements intact.      Conjunctiva/sclera: Conjunctivae normal.   Cardiovascular:      Rate and Rhythm: Normal rate and regular rhythm.   Pulmonary:      Effort: Pulmonary effort is normal.      Breath sounds: Normal breath sounds.   Abdominal:      Palpations: Abdomen is soft.      Tenderness: There is no abdominal tenderness.   Musculoskeletal: Normal range of motion.   Skin:     General: Skin is warm and dry.   Neurological:      General: No focal deficit present.  Mental Status: He is alert.   Psychiatric:         Mood and Affect: Mood normal.         Behavior: Behavior normal.         ED Course          Laboratory Results   Labs Reviewed - No data to display    Imaging Results     No orders to display       Administered Medications     Medication Administration from 08/27/2018 1744 to 08/27/2018 1923       Date/Time Order Dose Route Action Action by Comments     08/27/2018 1852 sodium chloride 0.9% IV soln bolus 1,000 mL 1,000 mL Intravenous Not Given Patrecia Pour, RN      08/27/2018 1909 ketorolac 30 mg/mL inj 30 mg 30 mg IV Push Not Given Patrecia Pour, RN      08/27/2018 1909 ondansetron 4 mg/2 mL inj 4 mg 4 mg IV Push Not Given Patrecia Pour, RN           Procedures   ED ECG interpretation  Date/Time: 08/27/2018 5:54 PM  Performed by: Tera Mater., MD  Authorized by: Tera Mater., MD     ECG reviewed by ED Physician in the absence of a cardiologist: yes    Interpretation:     Interpretation: non-specific    Rate:     ECG rate:  111    ECG rate assessment: normal    Rhythm: Rhythm: sinus tachycardia    Ectopy:     Ectopy: none    QRS:     QRS axis:  Normal    QRS intervals:  Normal  Conduction:     Conduction: normal    ST segments:     ST segments:  Normal  T waves:     T waves: normal    Comments:      No acute ST changes      Rhythm Strip Interpretation  08/27/2018  6:00 PM  Sinus tachycardia rate of 105 bpm.    MDM     1851: patient is refusing labs and cardiac work-up.     Patient progress: improved  I have reviewed the patient's vital signs and nursing notes and any labs or imaging studies that were performed in the ER. I had a detailed discussion with the patient regarding the historical points, exam findings and any diagnostic results supporting the discharge diagnosis. I also discussed the need for outpatient follow up and the need to return to the ED if symptoms worsen or if there are any questions or concerns that arise at home.  The patient is well appearing, tolerating PO's and will follow up with PMD.    Consults: N/A     Clinical Impression     1. Acute chest pain        Prescriptions     Discharge Medication List as of 08/27/2018  7:07 PM          Disposition and Follow-up   Disposition: Discharge [1]    No future appointments.    Follow up with:  No follow-up provider specified.    Return precautions are specified on After Visit Summary.    The documentation on this chart was performed by Ladon Applebaum, scribed for Tera Mater., MD     6:31 PM 08/27/2018      ***

## 2018-08-28 NOTE — ED Notes
Patient states '' I hate needles. I'm not going to have blood draw.''

## 2018-08-28 NOTE — ED Notes
I introduced myself to the patient. Connected patient to pulse ox and NIBP. Placed bed in lowest position, call light within reach. Side rail up x1 with patient visible by staff. I explained potential delays to the patient. Patient's needs was discussed. Patient's privacy was acknowledged. ED course explained to the patient. Lying in gurney comfortably. Patient's armband verified for correct identification.

## 2018-08-28 NOTE — ED Notes
Patient refused  VS and blood work. Dr. Irven Coe aware.   Patient discharged from emergency department ambulatory with stable/improved condition.   Emphasized to f/u for any concern and with PMD on 08-28-18.   Otherwise instructed to return to the emergency department as needed for any problem or concerns.   The patient was given a copy of his discharge instructions/AVS.   Patient verbalized understanding of discharge instruction/teachings as evidence by repeat back.   No complaints made.  Patient declined all VS.   All belongings sent with patient.

## 2020-01-24 DIAGNOSIS — F419 Anxiety disorder, unspecified: Secondary | ICD-10-CM | POA: Insufficient documentation

## 2020-01-24 DIAGNOSIS — F1991 Other psychoactive substance use, unspecified, in remission: Secondary | ICD-10-CM | POA: Insufficient documentation

## 2020-01-24 DIAGNOSIS — F900 Attention-deficit hyperactivity disorder, predominantly inattentive type: Secondary | ICD-10-CM | POA: Insufficient documentation

## 2020-01-24 DIAGNOSIS — F4310 Post-traumatic stress disorder, unspecified: Secondary | ICD-10-CM | POA: Diagnosis present

## 2020-01-24 DIAGNOSIS — J45990 Exercise induced bronchospasm: Secondary | ICD-10-CM | POA: Diagnosis present

## 2020-03-11 ENCOUNTER — Other Ambulatory Visit: Payer: Self-pay | Admitting: Internal Medicine

## 2020-03-11 DIAGNOSIS — B1920 Unspecified viral hepatitis C without hepatic coma: Secondary | ICD-10-CM

## 2020-03-21 ENCOUNTER — Ambulatory Visit
Admission: RE | Admit: 2020-03-21 | Discharge: 2020-03-21 | Disposition: A | Discharge status: Home / Self Care | Payer: BLUE CROSS/BLUE SHIELD | Source: Ambulatory Visit | Attending: Internal Medicine | Admitting: Internal Medicine

## 2020-03-21 DIAGNOSIS — B1920 Unspecified viral hepatitis C without hepatic coma: Secondary | ICD-10-CM

## 2020-03-21 IMAGING — US US ABDOMEN LIMITED W/ ELASTOGRAPHY
1 series · 12 of 12 positions shown · non-contrast
Comparison: None.

CLINICAL DATA: Hepatitis C virus infection without hepatic coma.

EXAM:
US ABDOMEN LIMITED - RIGHT UPPER QUADRANT
ULTRASOUND HEPATIC ELASTOGRAPHY
TECHNIQUE: Sonography of the right upper quadrant was performed. In addition,
ultrasound elastography evaluation of the liver was performed. A
region of interest was placed within the right lobe of the liver.
Following application of a compressive sonographic pulse, tissue
compressibility was assessed. Multiple assessments were performed at
the selected site. Median tissue compressibility was determined.
Previously, hepatic stiffness was assessed by shear wave velocity.
Based on recently published Society of Radiologists in Ultrasound
consensus article, reporting is now recommended to be performed in
the SI units of pressure (kiloPascals) representing hepatic
stiffness/elasticity. The obtained result is compared to the
published reference standards. (cACLD = compensated Advanced Chronic
Liver Disease)

[Series 1: us abdomen limited w/ elastography · 0.12mm/px · 12 of 12 slices shown]
[im 1/12]
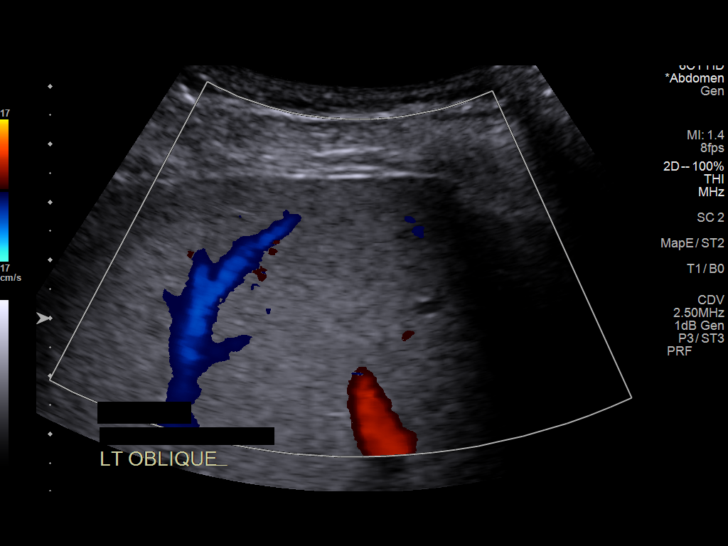
[im 2/12]
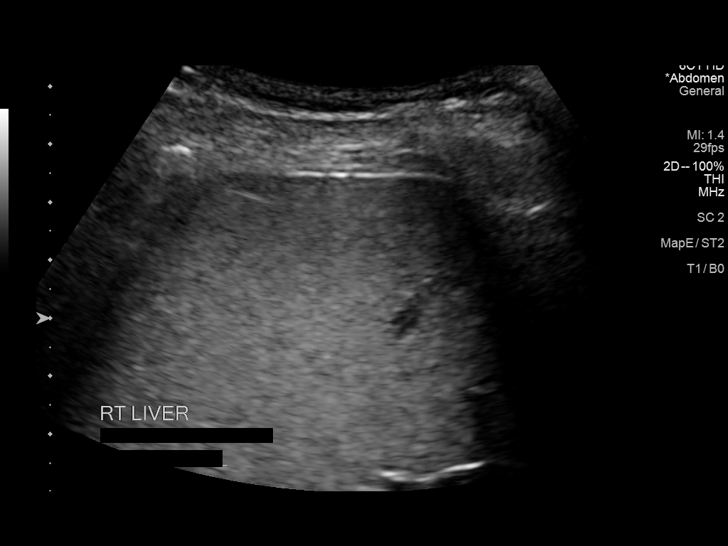
[im 3/12]
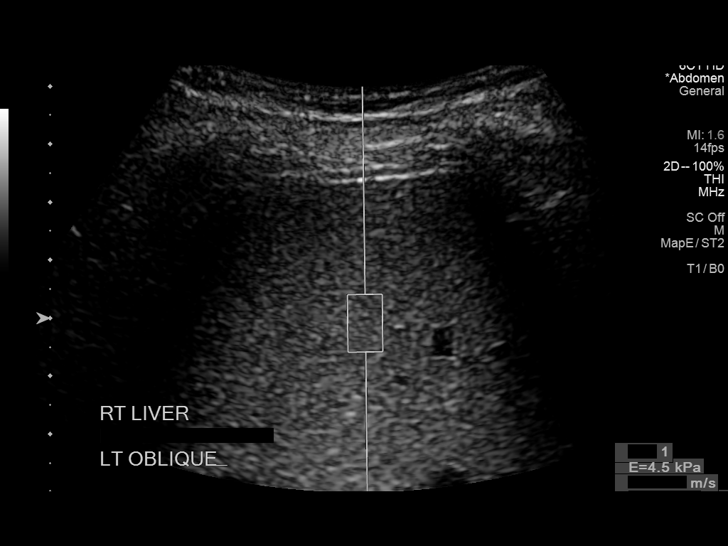
[im 4/12]
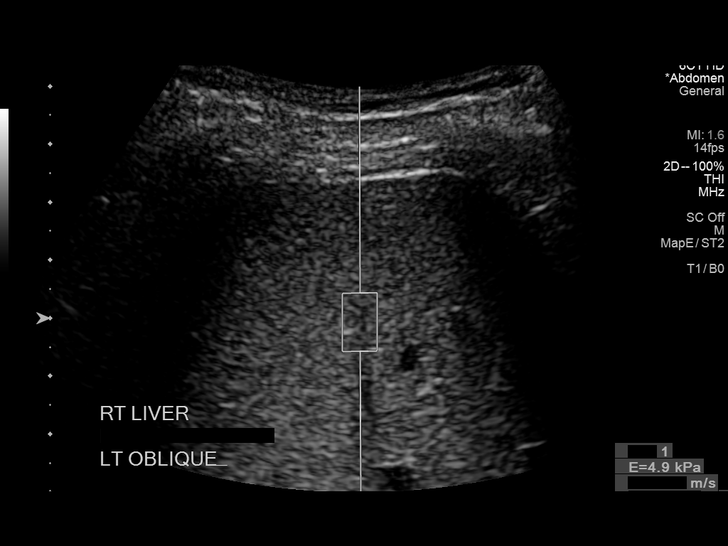
[im 5/12]
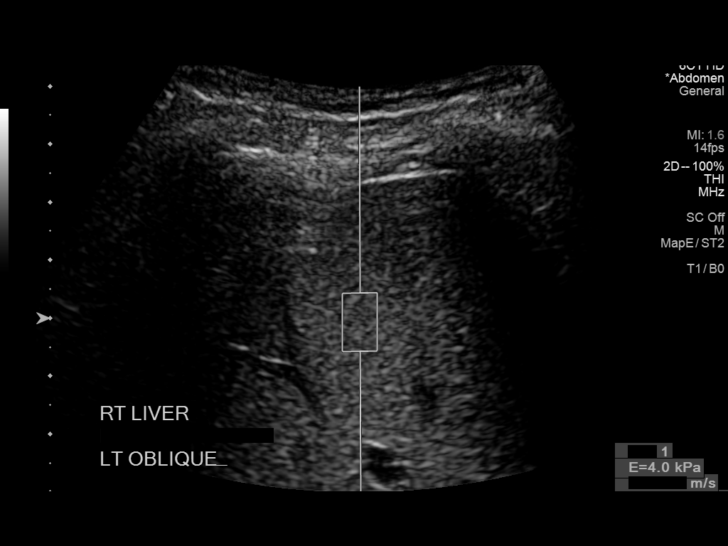
[im 6/12]
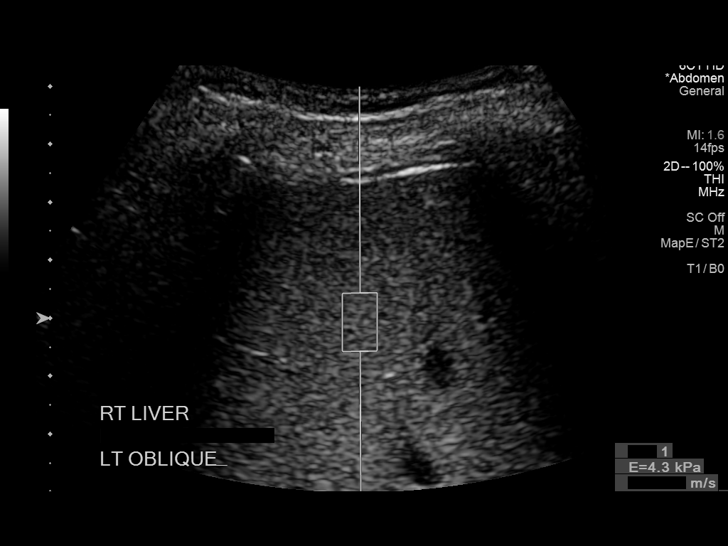
[im 7/12]
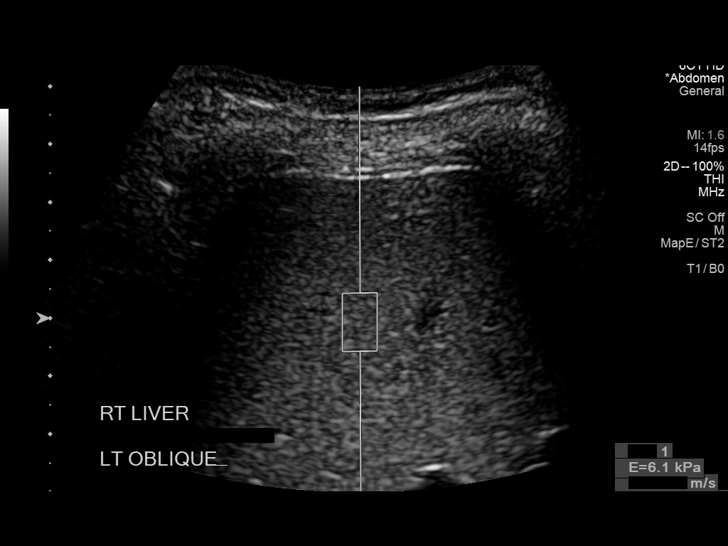
[im 8/12]
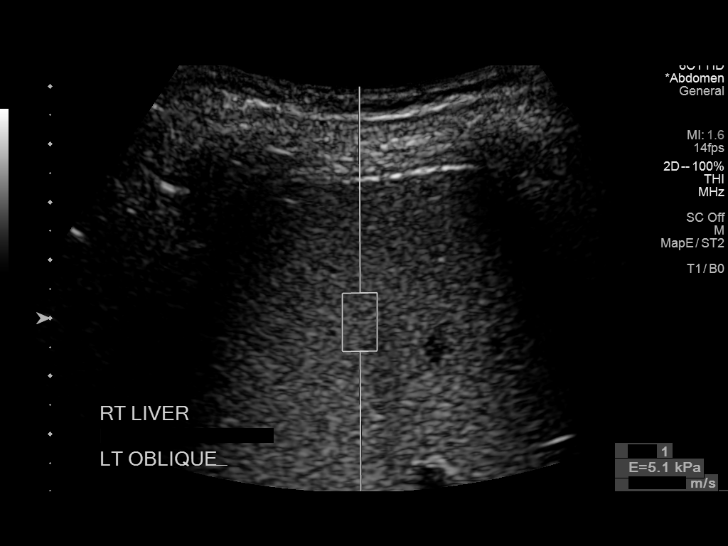
[im 9/12]
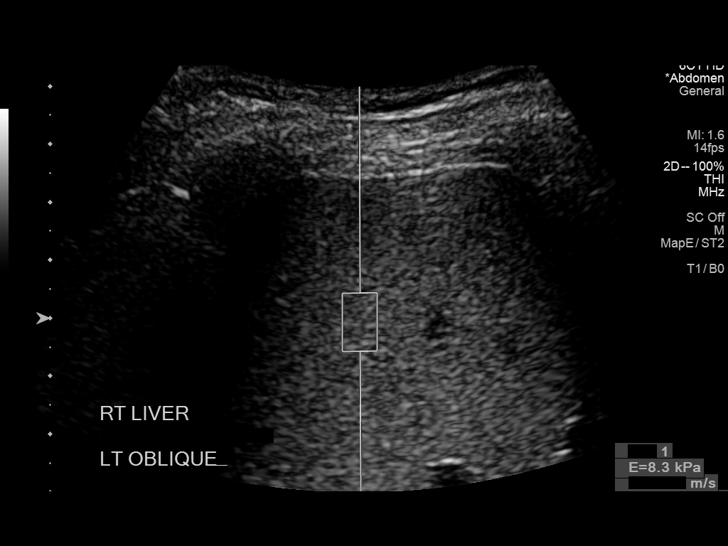
[im 10/12]
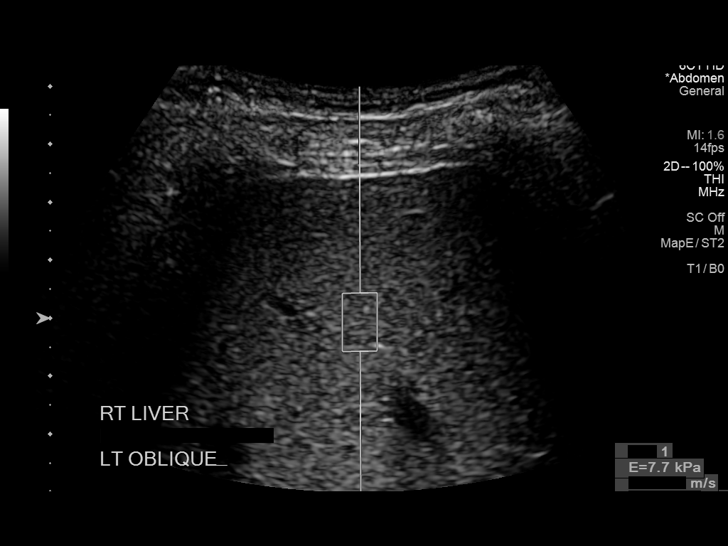
[im 11/12]
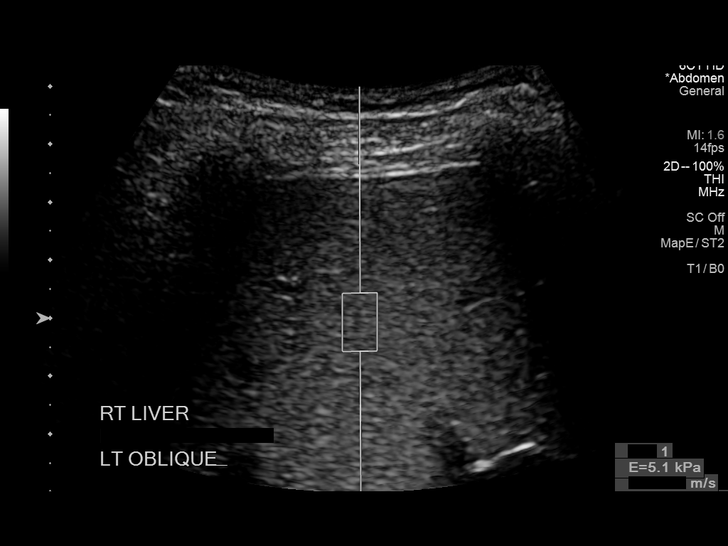
[im 12/12]
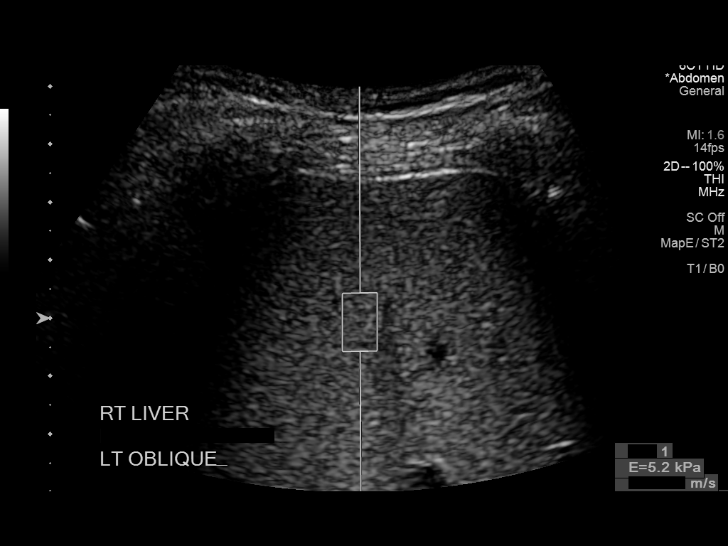

[12 of 12 positions shown; findings below may reference images not displayed]

FINDINGS: ULTRASOUND ABDOMEN LIMITED RIGHT UPPER QUADRANT

Gallbladder:

No gallstones or wall thickening visualized. No sonographic Murphy
sign noted.

Common bile duct:

Diameter: 3 mm, within normal limits.

Liver:

Mildly increased parenchymal echogenicity, consistent with hepatic
steatosis. No hepatic masses identified. Portal vein is patent on
color Doppler imaging with normal direction of blood flow towards
the liver.

ULTRASOUND HEPATIC ELASTOGRAPHY

Device: Siemens Helix VTQ

Patient position: Oblique

Transducer 6C1

Number of measurements: 10

Hepatic segment:  8

Median kPa:

IQR:

IQR/Median kPa ratio:

Data quality: Reduced accuracy indicated by IQR/Median kPa ratio =
or >

Diagnostic category: < or = 9 kPa: in the absence of other known
clinical signs, rules out cACLD

The use of hepatic elastography is applicable to patients with viral
hepatitis and non-alcoholic fatty liver disease. At this time, there
is insufficient data for the referenced cut-off values and use in
other causes of liver disease, including alcoholic liver disease.
Patients, however, may be assessed by elastography and serve as
their own reference standard/baseline.

In patients with non-alcoholic liver disease, the values suggesting
compensated advanced chronic liver disease (cACLD) may be lower, and
patients may need additional testing with elasticity results of [DATE]
kPa.

Please note that abnormal hepatic elasticity and shear wave
velocities may also be identified in clinical settings other than
with hepatic fibrosis, such as: acute hepatitis, elevated right
heart and central venous pressures including use of beta blockers,
JUMIA disease (JUMIA), infiltrative processes such as
mastocytosis/amyloidosis/infiltrative tumor/lymphoma, extrahepatic
cholestasis, with hyperemia in the post-prandial state, and with
liver transplantation. Correlation with patient history, laboratory
data, and clinical condition recommended.

Diagnostic Categories:

< or =5 kPa: high probability of being normal

< or =9 kPa: in the absence of other known clinical signs, rules [DATE] kPa and ?13 kPa: suggestive of cACLD, but needs further testing

>13 kPa: highly suggestive of cACLD

> or =17 kPa: highly suggestive of cACLD with an increased
probability of clinically significant portal hypertension
IMPRESSION: ULTRASOUND RUQ:

Mild diffuse hepatic steatosis.  No hepatic mass identified.

No evidence of cholelithiasis or biliary ductal dilatation.

ULTRASOUND HEPATIC ELASTOGRAPHY:

Median kPa: 5.1; note that elevated IQR/Median kPa ratio indicates
reduced accuracy of results.

Diagnostic category: < or = 9 kPa: in the absence of other known
clinical signs, rules out cACLD

## 2020-03-21 IMAGING — US US ABDOMEN LIMITED W/ ELASTOGRAPHY
1 series · 12 of 25 positions shown · non-contrast
Comparison: None.

CLINICAL DATA: Hepatitis C virus infection without hepatic coma.

EXAM:
US ABDOMEN LIMITED - RIGHT UPPER QUADRANT
ULTRASOUND HEPATIC ELASTOGRAPHY
TECHNIQUE: Sonography of the right upper quadrant was performed. In addition,
ultrasound elastography evaluation of the liver was performed. A
region of interest was placed within the right lobe of the liver.
Following application of a compressive sonographic pulse, tissue
compressibility was assessed. Multiple assessments were performed at
the selected site. Median tissue compressibility was determined.
Previously, hepatic stiffness was assessed by shear wave velocity.
Based on recently published Society of Radiologists in Ultrasound
consensus article, reporting is now recommended to be performed in
the SI units of pressure (kiloPascals) representing hepatic
stiffness/elasticity. The obtained result is compared to the
published reference standards. (cACLD = compensated Advanced Chronic
Liver Disease)

[Series 1: us abdomen limited w/ elastography · 0.17mm/px · 12 of 43 slices shown]
[im 2/43]
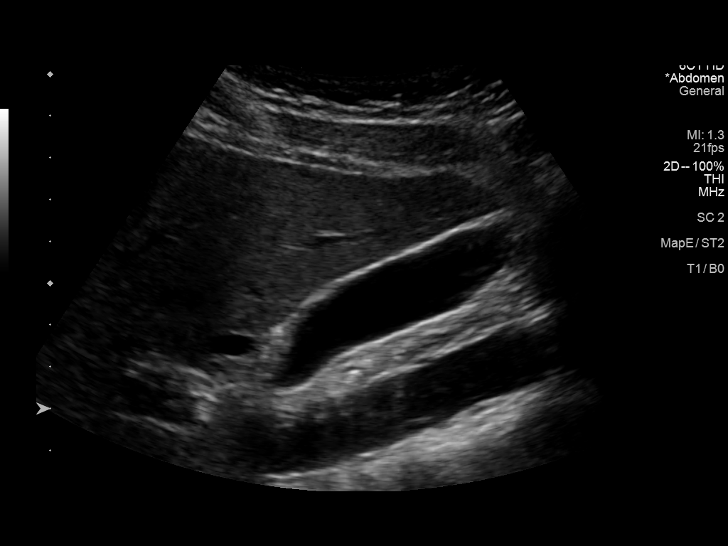
[im 6/43]
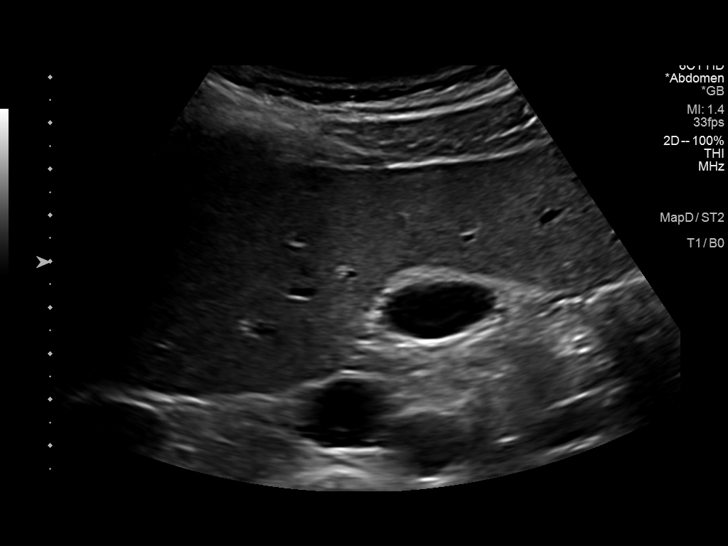
[im 9/43]
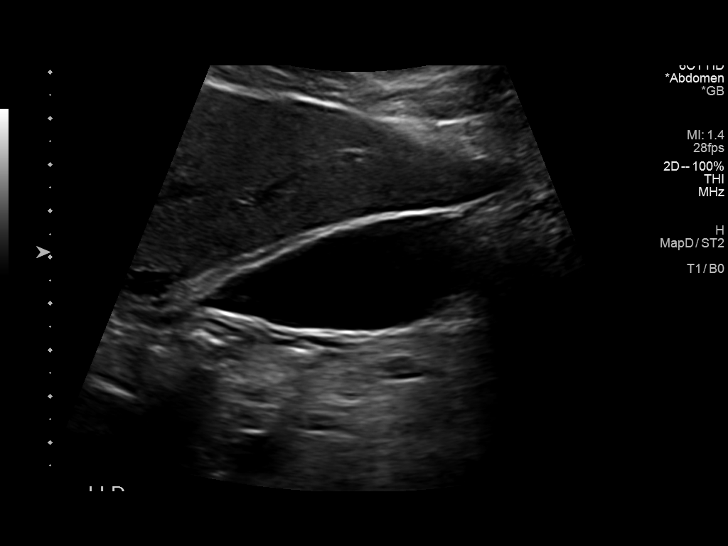
[im 13/43]
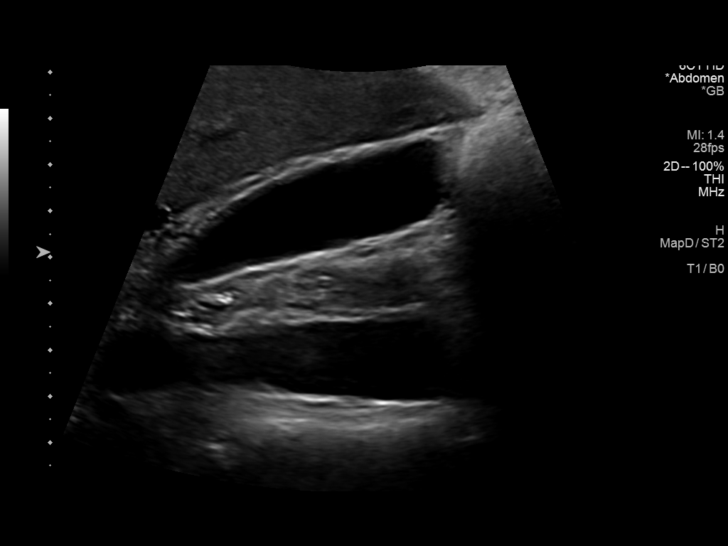
[im 16/43]
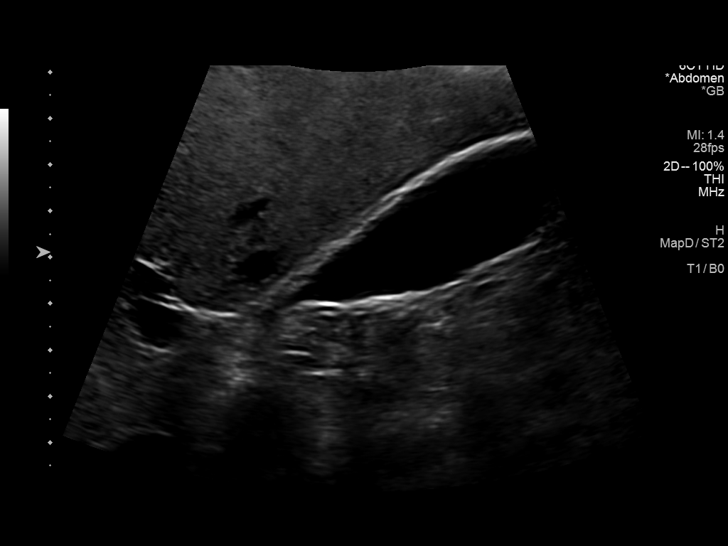
[im 20/43]
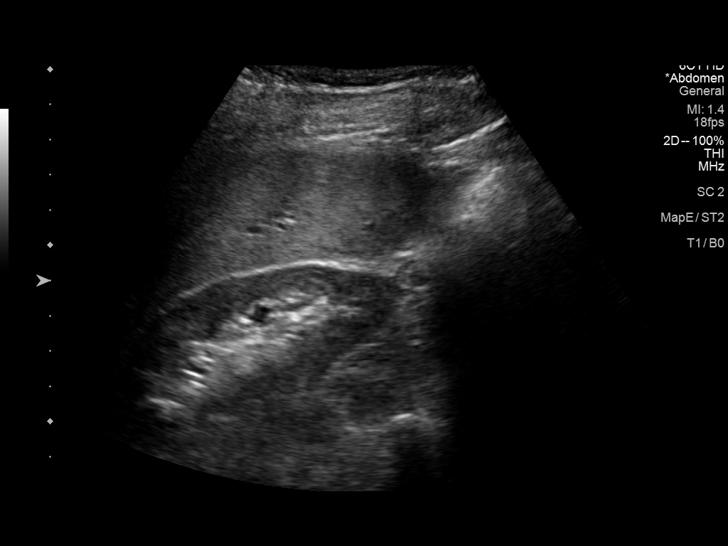
[im 23/43]
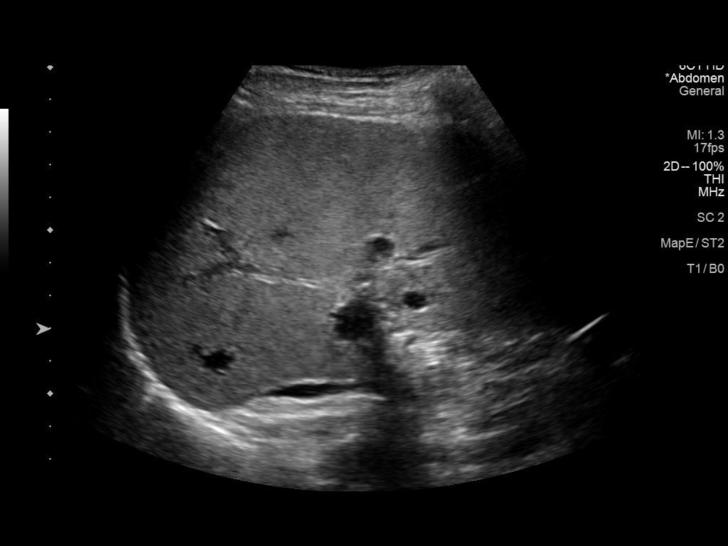
[im 27/43]
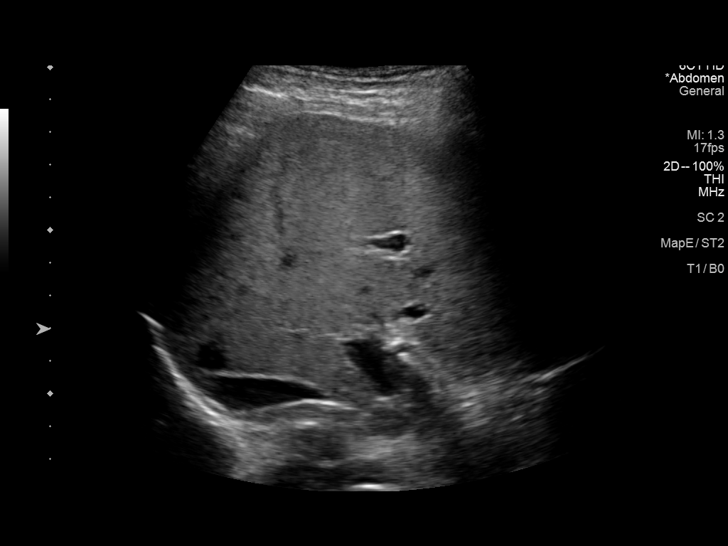
[im 30/43]
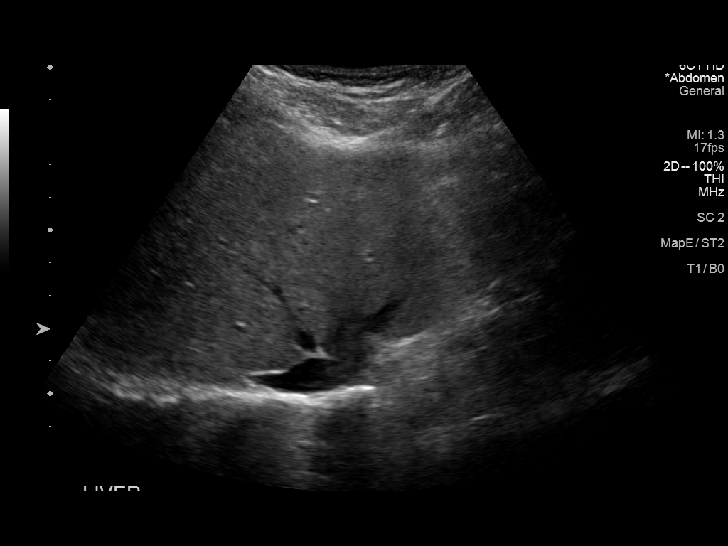
[im 34/43]
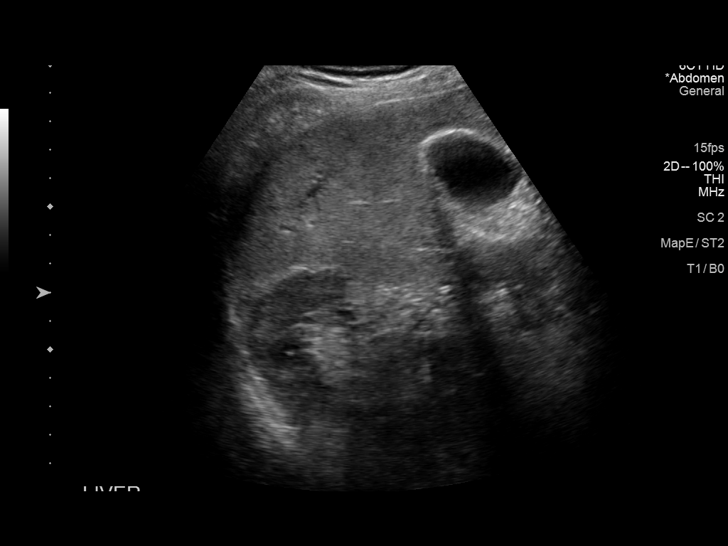
[im 37/43]
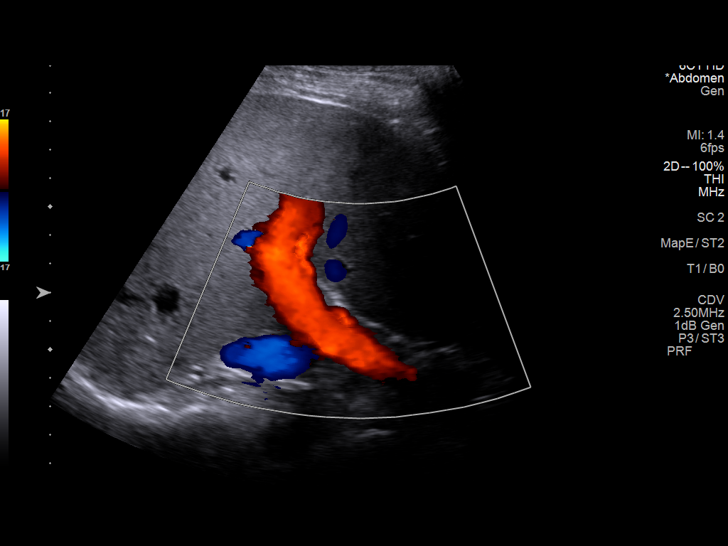
[im 41/43]
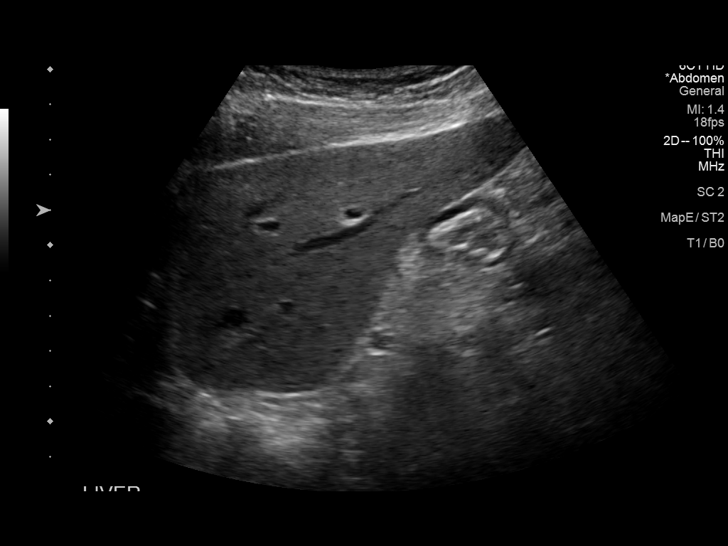

[12 of 25 positions shown; findings below may reference images not displayed]

FINDINGS: ULTRASOUND ABDOMEN LIMITED RIGHT UPPER QUADRANT

Gallbladder:

No gallstones or wall thickening visualized. No sonographic Murphy
sign noted.

Common bile duct:

Diameter: 3 mm, within normal limits.

Liver:

Mildly increased parenchymal echogenicity, consistent with hepatic
steatosis. No hepatic masses identified. Portal vein is patent on
color Doppler imaging with normal direction of blood flow towards
the liver.

ULTRASOUND HEPATIC ELASTOGRAPHY

Device: Siemens Helix VTQ

Patient position: Oblique

Transducer 6C1

Number of measurements: 10

Hepatic segment:  8

Median kPa:

IQR:

IQR/Median kPa ratio:

Data quality: Reduced accuracy indicated by IQR/Median kPa ratio =
or >

Diagnostic category: < or = 9 kPa: in the absence of other known
clinical signs, rules out cACLD

The use of hepatic elastography is applicable to patients with viral
hepatitis and non-alcoholic fatty liver disease. At this time, there
is insufficient data for the referenced cut-off values and use in
other causes of liver disease, including alcoholic liver disease.
Patients, however, may be assessed by elastography and serve as
their own reference standard/baseline.

In patients with non-alcoholic liver disease, the values suggesting
compensated advanced chronic liver disease (cACLD) may be lower, and
patients may need additional testing with elasticity results of [DATE]
kPa.

Please note that abnormal hepatic elasticity and shear wave
velocities may also be identified in clinical settings other than
with hepatic fibrosis, such as: acute hepatitis, elevated right
heart and central venous pressures including use of beta blockers,
JUMIA disease (JUMIA), infiltrative processes such as
mastocytosis/amyloidosis/infiltrative tumor/lymphoma, extrahepatic
cholestasis, with hyperemia in the post-prandial state, and with
liver transplantation. Correlation with patient history, laboratory
data, and clinical condition recommended.

Diagnostic Categories:

< or =5 kPa: high probability of being normal

< or =9 kPa: in the absence of other known clinical signs, rules [DATE] kPa and ?13 kPa: suggestive of cACLD, but needs further testing

>13 kPa: highly suggestive of cACLD

> or =17 kPa: highly suggestive of cACLD with an increased
probability of clinically significant portal hypertension
IMPRESSION: ULTRASOUND RUQ:

Mild diffuse hepatic steatosis.  No hepatic mass identified.

No evidence of cholelithiasis or biliary ductal dilatation.

ULTRASOUND HEPATIC ELASTOGRAPHY:

Median kPa: 5.1; note that elevated IQR/Median kPa ratio indicates
reduced accuracy of results.

Diagnostic category: < or = 9 kPa: in the absence of other known
clinical signs, rules out cACLD

## 2020-04-09 DIAGNOSIS — R200 Anesthesia of skin: Secondary | ICD-10-CM | POA: Insufficient documentation

## 2021-01-13 ENCOUNTER — Emergency Department (EMERGENCY_DEPARTMENT_HOSPITAL)
Admission: EM | Admit: 2021-01-13 | Discharge: 2021-01-14 | Discharge status: Against Medical Advice | Payer: BLUE CROSS/BLUE SHIELD | Source: Home / Self Care | Attending: Emergency Medicine | Admitting: Emergency Medicine

## 2021-01-13 ENCOUNTER — Emergency Department (HOSPITAL_COMMUNITY): Payer: BLUE CROSS/BLUE SHIELD

## 2021-01-13 ENCOUNTER — Other Ambulatory Visit: Payer: Self-pay

## 2021-01-13 ENCOUNTER — Encounter (HOSPITAL_COMMUNITY): Payer: Self-pay

## 2021-01-13 DIAGNOSIS — Z79899 Other long term (current) drug therapy: Secondary | ICD-10-CM | POA: Insufficient documentation

## 2021-01-13 DIAGNOSIS — R202 Paresthesia of skin: Secondary | ICD-10-CM | POA: Insufficient documentation

## 2021-01-13 DIAGNOSIS — R197 Diarrhea, unspecified: Secondary | ICD-10-CM | POA: Insufficient documentation

## 2021-01-13 DIAGNOSIS — M5412 Radiculopathy, cervical region: Secondary | ICD-10-CM | POA: Diagnosis not present

## 2021-01-13 DIAGNOSIS — R200 Anesthesia of skin: Secondary | ICD-10-CM | POA: Diagnosis not present

## 2021-01-13 DIAGNOSIS — F1210 Cannabis abuse, uncomplicated: Secondary | ICD-10-CM | POA: Insufficient documentation

## 2021-01-13 DIAGNOSIS — R2689 Other abnormalities of gait and mobility: Secondary | ICD-10-CM | POA: Insufficient documentation

## 2021-01-13 DIAGNOSIS — R531 Weakness: Secondary | ICD-10-CM | POA: Insufficient documentation

## 2021-01-13 DIAGNOSIS — E538 Deficiency of other specified B group vitamins: Secondary | ICD-10-CM | POA: Diagnosis not present

## 2021-01-13 DIAGNOSIS — Z87891 Personal history of nicotine dependence: Secondary | ICD-10-CM | POA: Insufficient documentation

## 2021-01-13 DIAGNOSIS — F1410 Cocaine abuse, uncomplicated: Secondary | ICD-10-CM | POA: Insufficient documentation

## 2021-01-13 DIAGNOSIS — R251 Tremor, unspecified: Secondary | ICD-10-CM | POA: Insufficient documentation

## 2021-01-13 LAB — COMPREHENSIVE METABOLIC PANEL
ALT: 27 U/L (ref 0–44)
AST: 20 U/L (ref 15–41)
Albumin: 4.5 g/dL (ref 3.5–5.0)
Alkaline Phosphatase: 43 U/L (ref 38–126)
Anion gap: 8 (ref 5–15)
BUN: 11 mg/dL (ref 6–20)
CO2: 27 mmol/L (ref 22–32)
Calcium: 9.2 mg/dL (ref 8.9–10.3)
Chloride: 103 mmol/L (ref 98–111)
Creatinine, Ser: 0.88 mg/dL (ref 0.61–1.24)
GFR, Estimated: >60 mL/min (ref >60)
Glucose, Bld: 91 mg/dL (ref 70–99)
Potassium: 3.9 mmol/L (ref 3.5–5.1)
Sodium: 138 mmol/L (ref 135–145)
Total Bilirubin: 0.4 mg/dL (ref 0.3–1.2)
Total Protein: 7.7 g/dL (ref 6.5–8.1)

## 2021-01-13 LAB — CBC WITH DIFFERENTIAL/PLATELET
Abs Immature Granulocytes: 0 10*3/uL (ref 0.00–0.07)
Basophils Absolute: 0 10*3/uL (ref 0.0–0.1)
Basophils Relative: 0 %
Eosinophils Absolute: 0.1 10*3/uL (ref 0.0–0.5)
Eosinophils Relative: 3 %
HCT: 40 % (ref 39.0–52.0)
Hemoglobin: 14 g/dL (ref 13.0–17.0)
Immature Granulocytes: 0 %
Lymphocytes Relative: 40 %
Lymphs Abs: 1.7 10*3/uL (ref 0.7–4.0)
MCH: 34.9 pg — ABNORMAL HIGH (ref 26.0–34.0)
MCHC: 35 g/dL (ref 30.0–36.0)
MCV: 99.8 fL (ref 80.0–100.0)
Monocytes Absolute: 0.4 10*3/uL (ref 0.1–1.0)
Monocytes Relative: 10 %
Neutro Abs: 2 10*3/uL (ref 1.7–7.7)
Neutrophils Relative %: 47 %
Platelets: 268 10*3/uL (ref 150–400)
RBC: 4.01 MIL/uL — ABNORMAL LOW (ref 4.22–5.81)
RDW: 14 % (ref 11.5–15.5)
WBC: 4.3 10*3/uL (ref 4.0–10.5)
nRBC: 0 % (ref 0.0–0.2)

## 2021-01-13 LAB — PHOSPHORUS: Phosphorus: 2.3 mg/dL — ABNORMAL LOW (ref 2.5–4.6)

## 2021-01-13 LAB — RAPID URINE DRUG SCREEN, HOSP PERFORMED
Amphetamines: POSITIVE — AB
Barbiturates: NOT DETECTED
Benzodiazepines: POSITIVE — AB
Cocaine: POSITIVE — AB
Opiates: NOT DETECTED
Tetrahydrocannabinol: POSITIVE — AB

## 2021-01-13 LAB — MAGNESIUM: Magnesium: 2.2 mg/dL (ref 1.7–2.4)

## 2021-01-13 IMAGING — CT CT HEAD W/O CM
3 series · 15 of 47 positions shown, 18 images · non-contrast
Comparison: Head CT dated [DATE].

CLINICAL DATA: 30-year-old male with ataxia.

EXAM:
CT HEAD WITHOUT CONTRAST
TECHNIQUE: Contiguous axial images were obtained from the base of the skull
through the vertex without intravenous contrast.

[Series 2: head wo · axial · 0.47mm/px · z∈[-118,+17]mm · 9 of 33 slices shown, 12 images]
[im 3/33  brain]
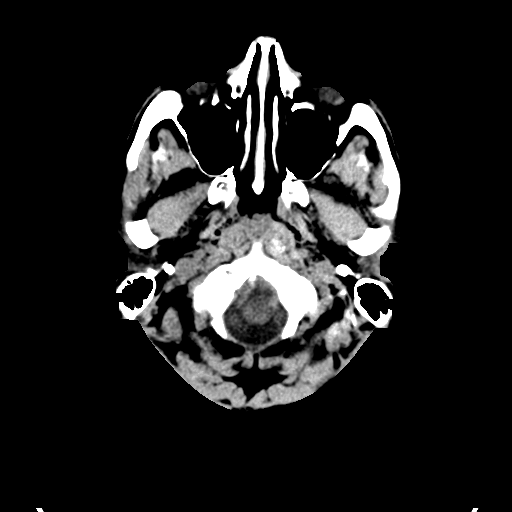
[im 3/33  bone]
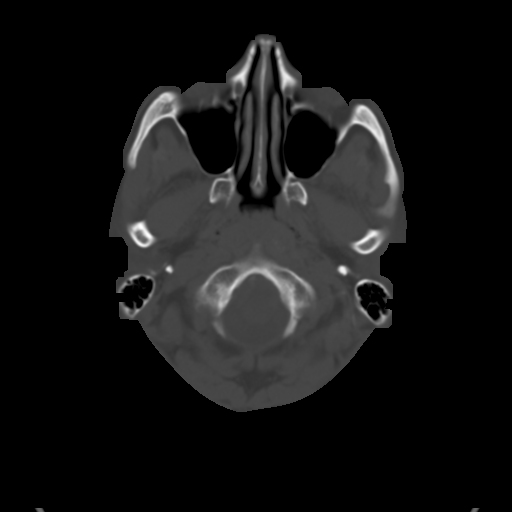
[im 6/33  brain]
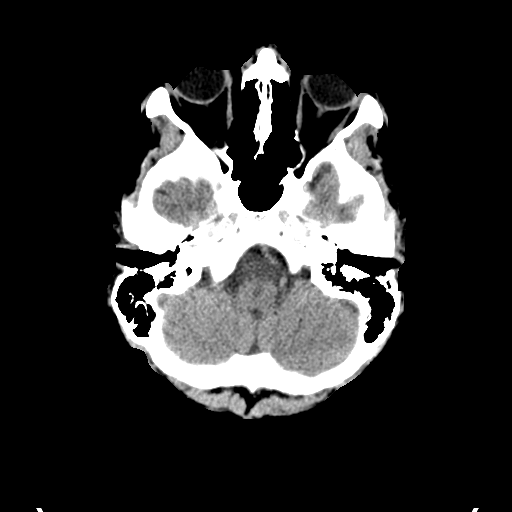
[im 9/33  brain]
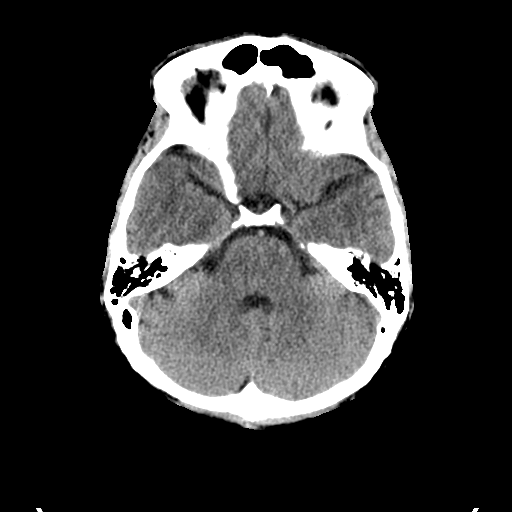
[im 13/33  brain]
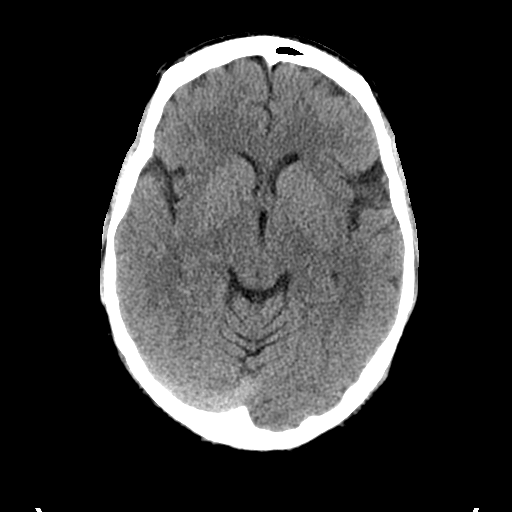
[im 17/33  brain]
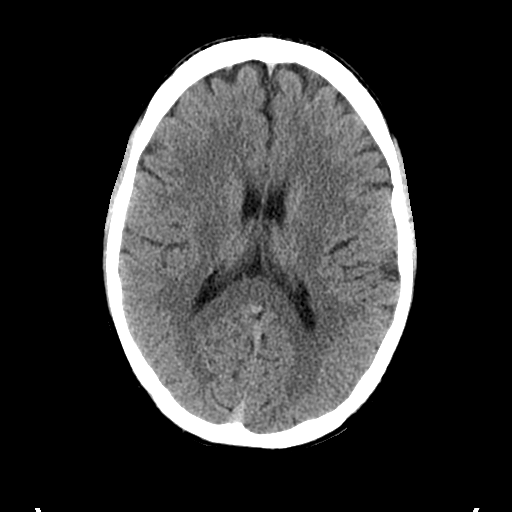
[im 17/33  bone]
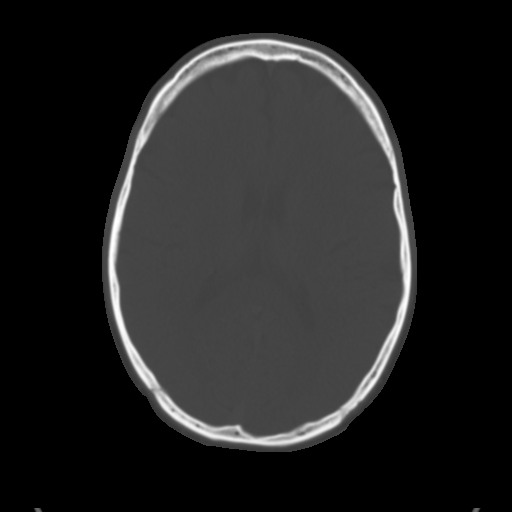
[im 20/33  brain]
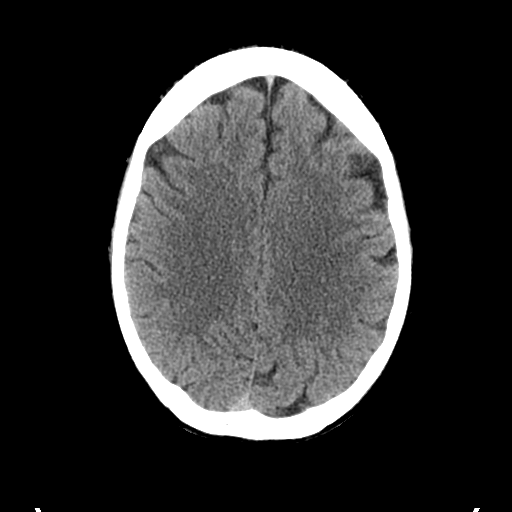
[im 24/33  brain]
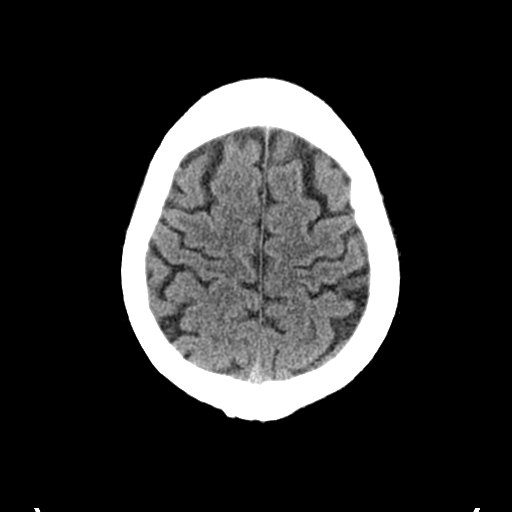
[im 27/33  brain]
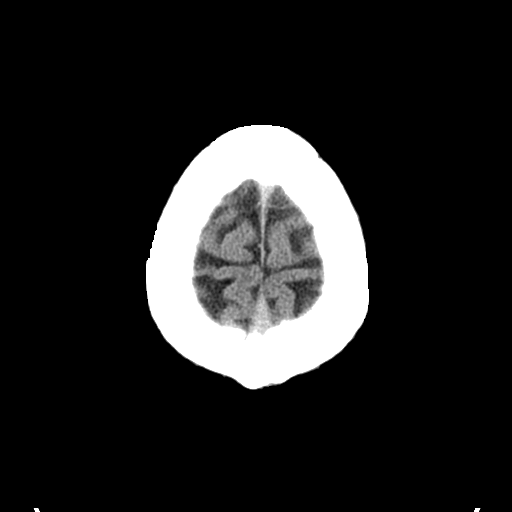
[im 30/33  brain]
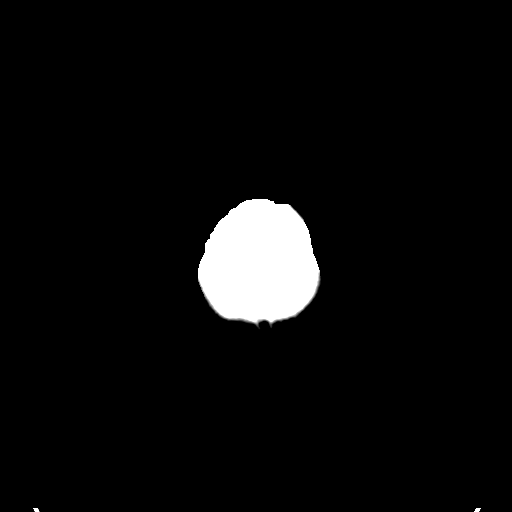
[im 30/33  bone]
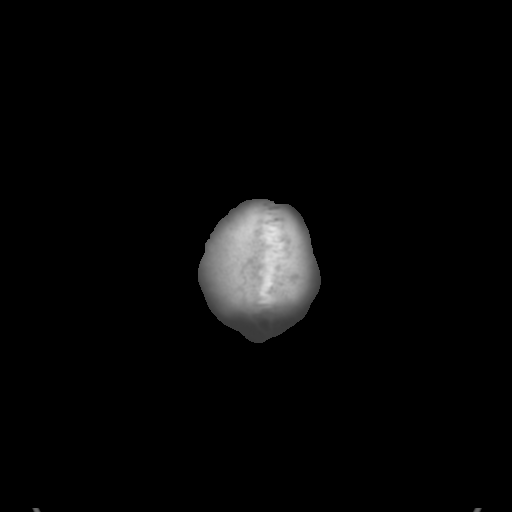

[Series 4: coronal soft tissue · coronal · 0.33mm/px · 3 of 74 slices shown]
[im 25/74  brain]
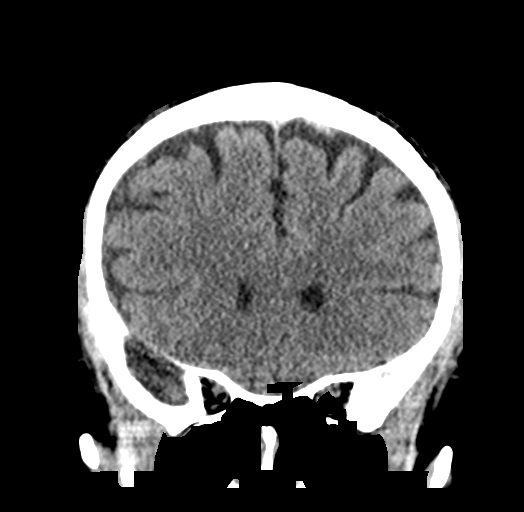
[im 33/74  brain]
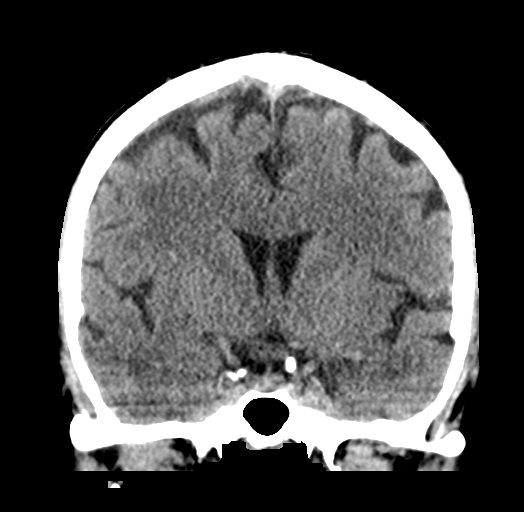
[im 41/74  brain]
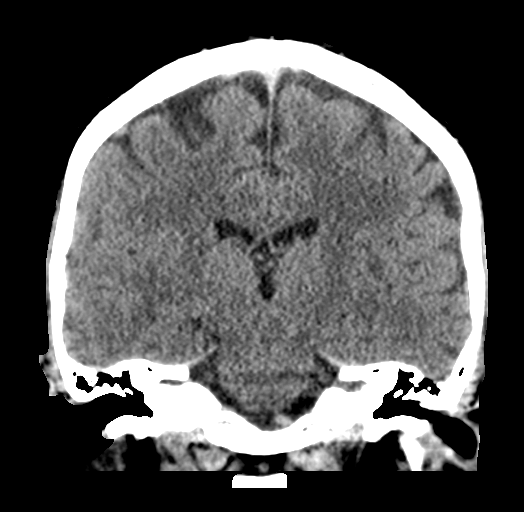

[Series 5: sagittal soft tissue · sagittal · 0.33mm/px · 3 of 60 slices shown]
[im 20/60  brain]
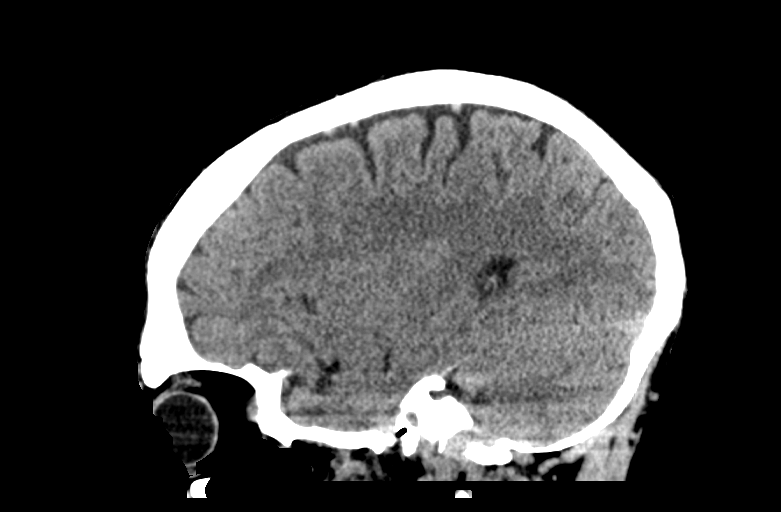
[im 30/60  brain]
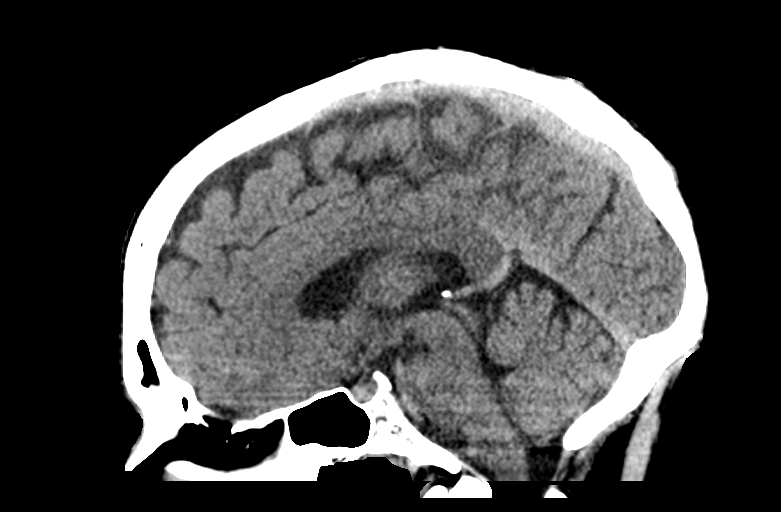
[im 40/60  brain]
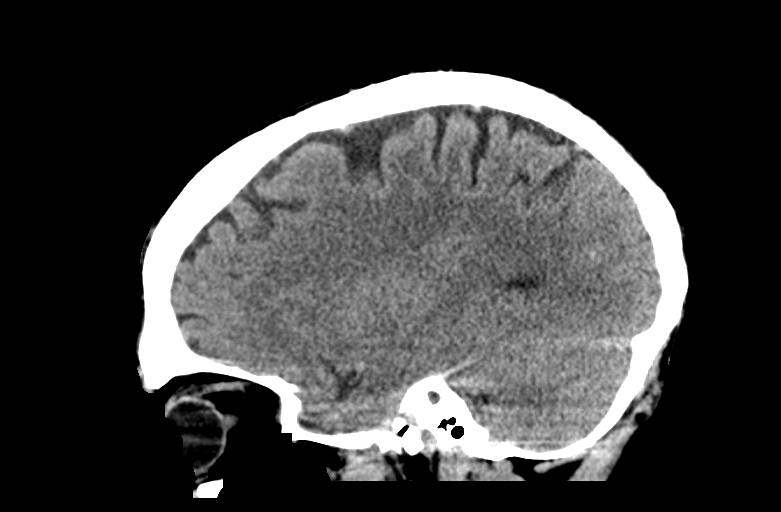

[15 of 47 positions shown; findings below may reference images not displayed]

FINDINGS: Brain: No evidence of acute infarction, hemorrhage, hydrocephalus,
extra-axial collection or mass lesion/mass effect.

Vascular: No hyperdense vessel or unexpected calcification.

Skull: Normal. Negative for fracture or focal lesion.

Sinuses/Orbits: No acute finding.

Other: None
IMPRESSION: Normal noncontrast CT of the brain.

## 2021-01-13 IMAGING — MR MR THORACIC SPINE WO/W CM
10 of 15 series · 27 of 48 positions shown · IV contrast (gadavist)
Comparison: None.

CLINICAL DATA: Motor neuron disease; worsening weakness and
numbness to all extremities with falls

EXAM:
MRI CERVICAL, THORACIC AND LUMBAR SPINE WITHOUT AND WITH CONTRAST
TECHNIQUE: Multiplanar and multiecho pulse sequences of the cervical spine, to
include the craniocervical junction and cervicothoracic junction,
and thoracic and lumbar spine, were obtained without and with
intravenous contrast.
CONTRAST:  6.5mL GADAVIST GADOBUTROL 1 MMOL/ML IV SOLN

[Series 8: T1 · sagittal · 4.0mm · 1.72mm/px · 1 of 5 slices shown (1 of 3)]
[im 1/5]
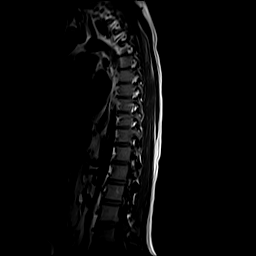

[Series 9: STIR · sagittal · 3.0mm · 1.00mm/px · 2 of 15 slices shown]
[im 1/15]
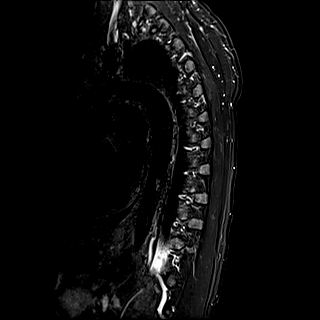
[im 15/15]
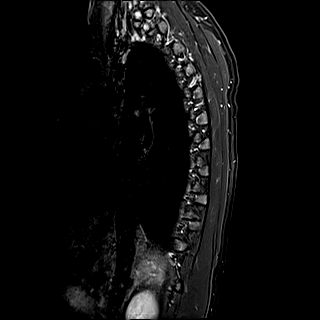

[Series 10: T1 · sagittal · 3.0mm · 1.00mm/px · 2 of 15 slices shown (2 of 3)]
[im 1/15]
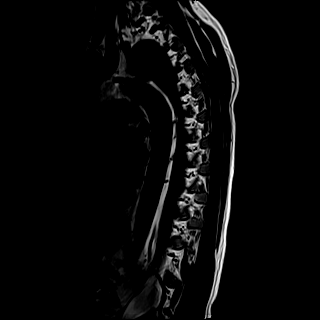
[im 15/15]
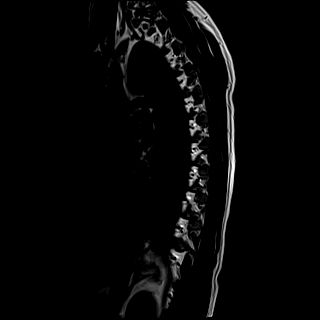

[Series 11: T2 · sagittal · 3.0mm · 0.83mm/px · 2 of 15 slices shown (1 of 2)]
[im 1/15]
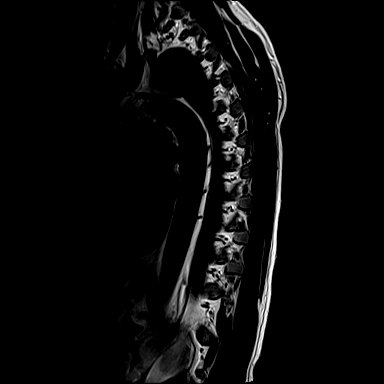
[im 15/15]
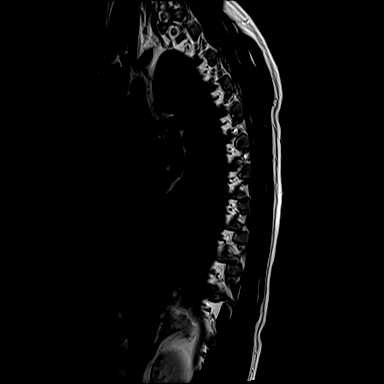

[Series 12: T2 · axial · 4.0mm · 0.78mm/px · z∈[-311,-85]mm · 4 of 39 slices shown (2 of 2)]
[im 1/39]
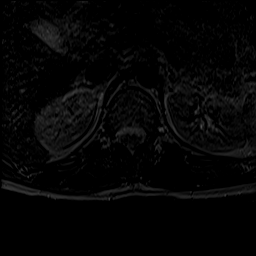
[im 13/39]
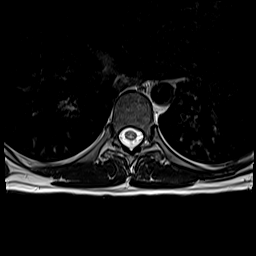
[im 26/39]
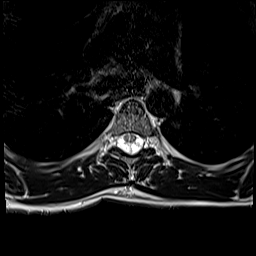
[im 39/39]
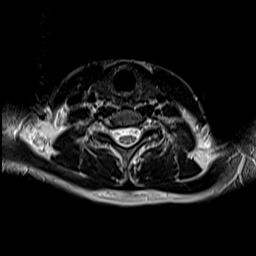

[Series 13: t2_me2d_tra t spine · axial · 4.0mm · 0.39mm/px · z∈[-311,-85]mm · 2 of 20 slices shown (1 of 2)]
[im 1/20]
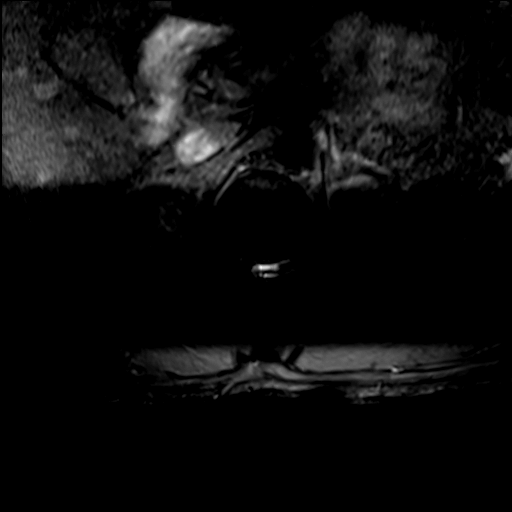
[im 20/20]
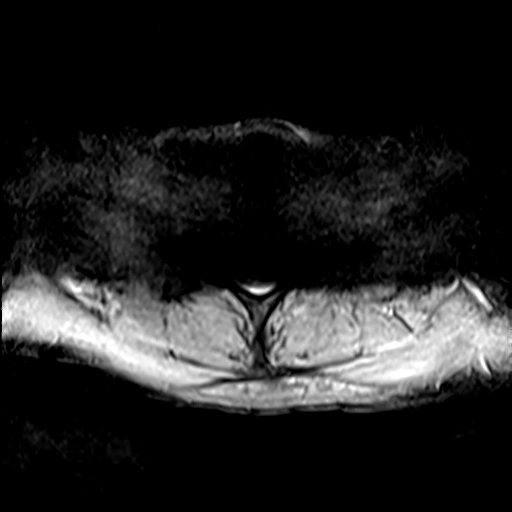

[Series 14: T1 · axial · 4.0mm · 0.39mm/px · z∈[-311,-85]mm · 4 of 39 slices shown (3 of 3)]
[im 1/39]
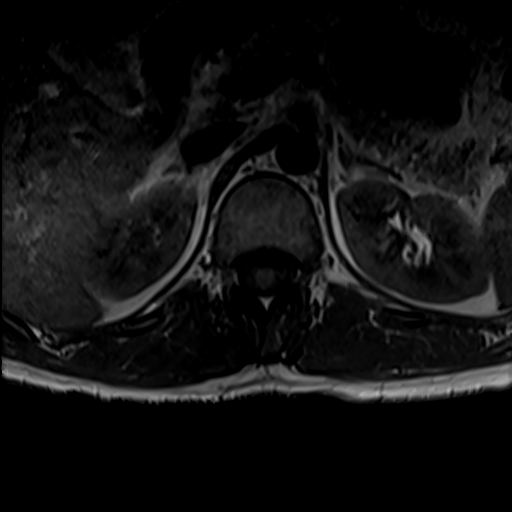
[im 13/39]
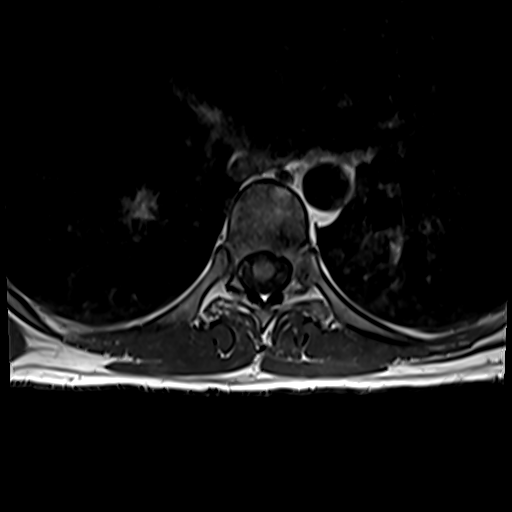
[im 26/39]
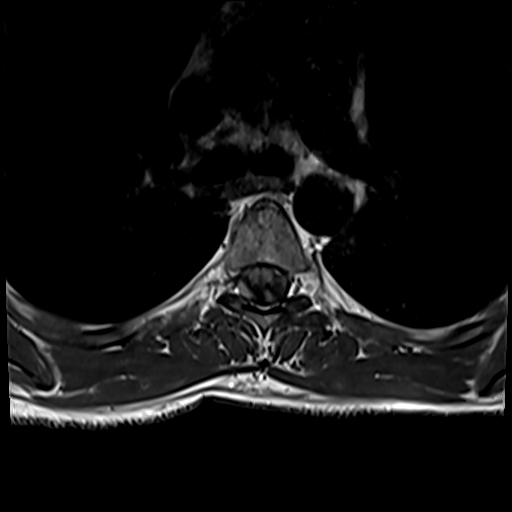
[im 39/39]
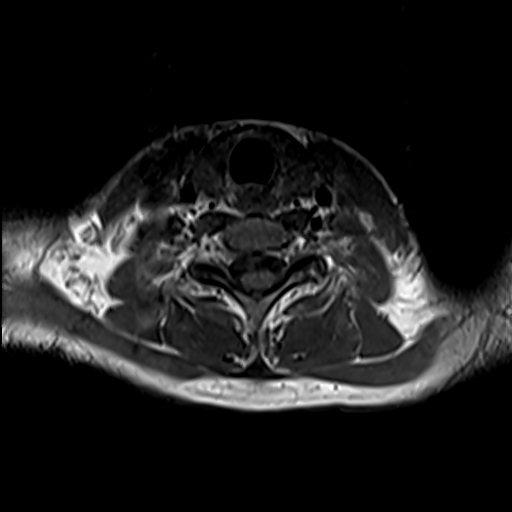

[Series 15: t2_me2d_tra t spine · axial · 4.0mm · 0.78mm/px · z∈[-311,-85]mm · 4 of 39 slices shown (2 of 2)]
[im 1/39]
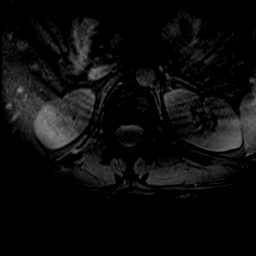
[im 13/39]
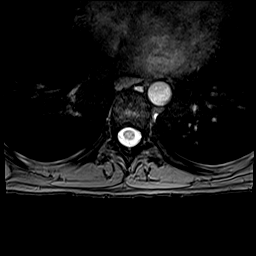
[im 26/39]
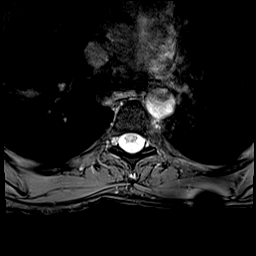
[im 39/39]
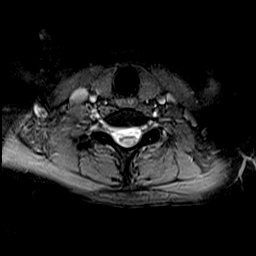

[Series 16: T1 fat-sat post-contrast · sagittal · 3.0mm · 1.00mm/px · 2 of 15 slices shown]
[im 1/15]
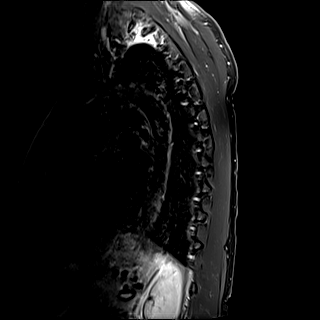
[im 15/15]
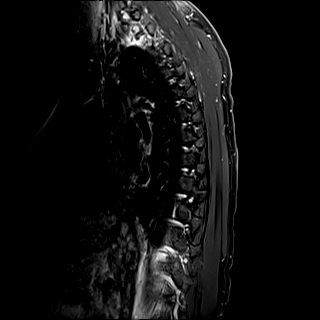

[Series 17: T1 post-contrast · axial · 4.0mm · 0.39mm/px · z∈[-311,-85]mm · 4 of 39 slices shown]
[im 1/39]
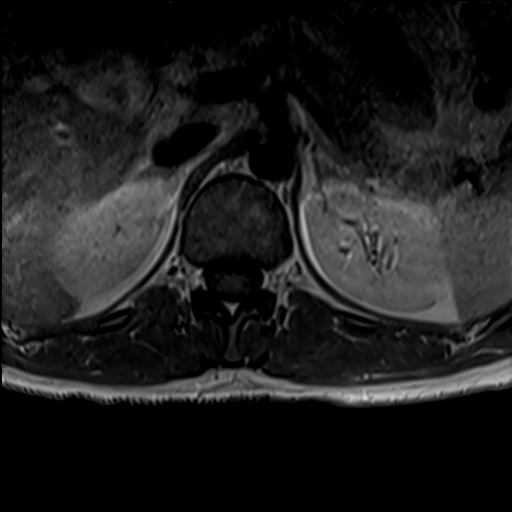
[im 13/39]
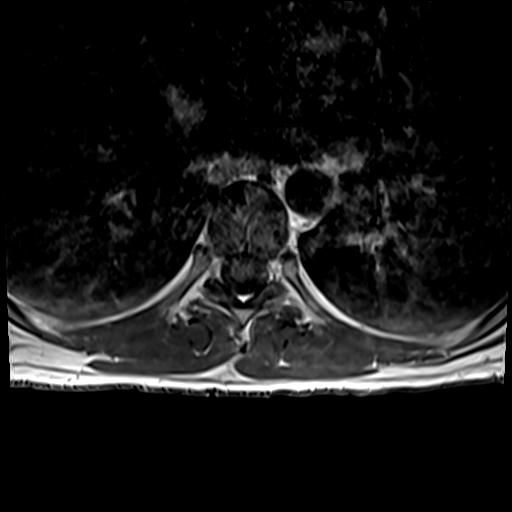
[im 26/39]
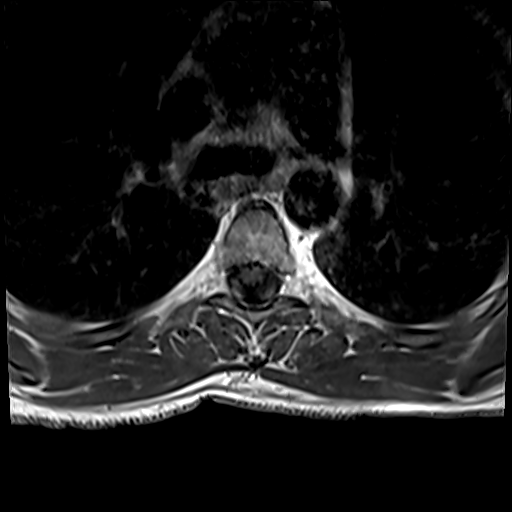
[im 39/39]
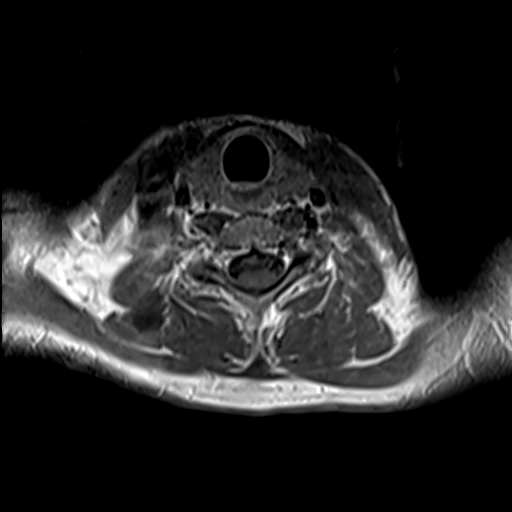

[27 of 48 positions shown; findings below may reference images not displayed]

FINDINGS: MRI CERVICAL SPINE

Alignment: No significant listhesis.

Vertebrae: Vertebral body heights are maintained. There is no marrow
edema. No suspicious osseous lesion.

Cord: Possible subtle longitudinally extensive abnormal cord T2
hyperintensity spanning cervical levels and confined to the gray
matter. No abnormal intrathecal enhancement.

Posterior Fossa, vertebral arteries, paraspinal tissues:
Unremarkable.

Disc levels: Intervertebral disc heights and signal are maintained.
No canal or foraminal stenosis at any level.

MRI THORACIC SPINE

Alignment:  Preserved.

Vertebrae: Vertebral body heights are maintained. There is no marrow
edema. No suspicious osseous lesion.

Cord:  No abnormal intrathecal enhancement.

Paraspinal and other soft tissues: Unremarkable.

Disc levels: Intervertebral disc heights and signal are maintained.
Trace central disc protrusions, for example at T4-T5. There is no
canal or foraminal stenosis at any level.

MRI LUMBAR SPINE

Segmentation:  Standard.

Alignment:  Preserved.

Vertebrae: Vertebral body heights are maintained. There is no marrow
edema. No suspicious osseous lesion.

Conus medullaris and cauda equina: Conus extends to the L1 level.
Conus and cauda equina appear normal. No abnormal intrathecal
enhancement.

Paraspinal and other soft tissues: Unremarkable.

Disc levels: Intervertebral disc heights and signal are maintained.
There is no canal or foraminal stenosis at any level.
IMPRESSION: Possible subtle longitudinally extensive abnormal cervical cord T2
hyperintensity without enhancement. Assuming this represents a true
finding rather than artifact, differential is broad and includes
infectious/post infectious and autoimmune etiologies. There are no
abnormal flow voids to suggest arteriovenous fistula.

## 2021-01-13 IMAGING — MR MR CERVICAL SPINE WO/W CM
8 of 11 series · 26 of 48 positions shown · IV contrast (gadavist)
Comparison: None.

CLINICAL DATA: Motor neuron disease; worsening weakness and
numbness to all extremities with falls

EXAM:
MRI CERVICAL, THORACIC AND LUMBAR SPINE WITHOUT AND WITH CONTRAST
TECHNIQUE: Multiplanar and multiecho pulse sequences of the cervical spine, to
include the craniocervical junction and cervicothoracic junction,
and thoracic and lumbar spine, were obtained without and with
intravenous contrast.
CONTRAST:  6.5mL GADAVIST GADOBUTROL 1 MMOL/ML IV SOLN

[Series 16: T1 · sagittal · 3.0mm · 0.69mm/px · 2 of 15 slices shown (1 of 2)]
[im 1/15]
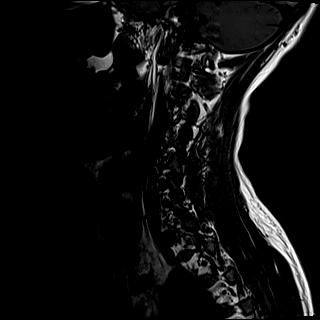
[im 15/15]
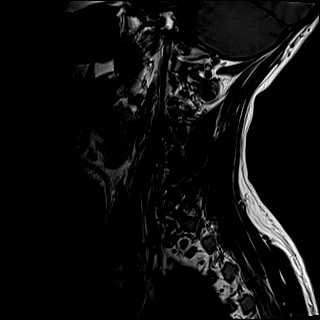

[Series 17: T2 · sagittal · 3.0mm · 0.69mm/px · 2 of 15 slices shown (1 of 2)]
[im 1/15]
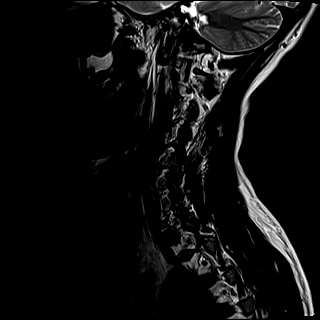
[im 15/15]
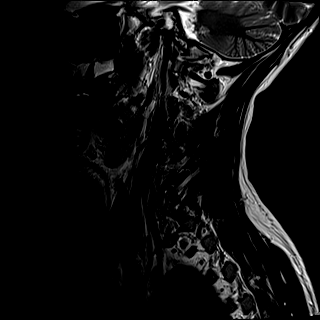

[Series 18: STIR · sagittal · 3.0mm · 0.86mm/px · 2 of 15 slices shown]
[im 1/15]
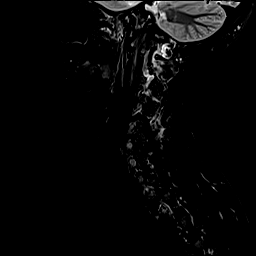
[im 15/15]
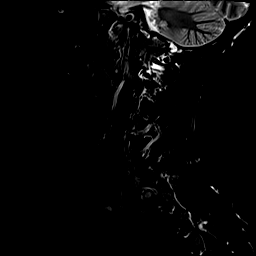

[Series 20: T2 · axial · 3.0mm · 0.70mm/px · z∈[-49,+75]mm · 5 of 34 slices shown (2 of 2)]
[im 1/34]
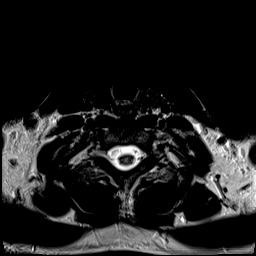
[im 9/34]
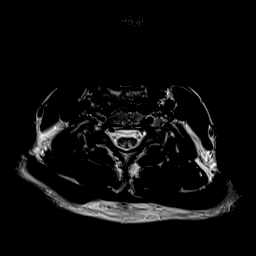
[im 17/34]
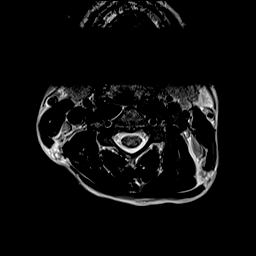
[im 25/34]
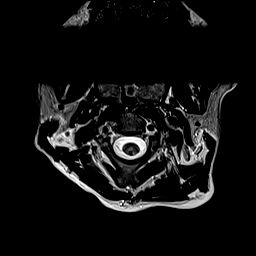
[im 34/34]
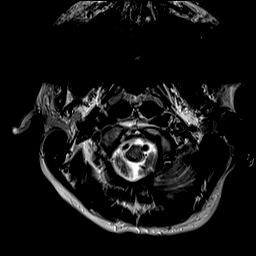

[Series 23: GRE · axial · 3.0mm · 0.35mm/px · 1 of 28 slices shown]
[im 1/28]
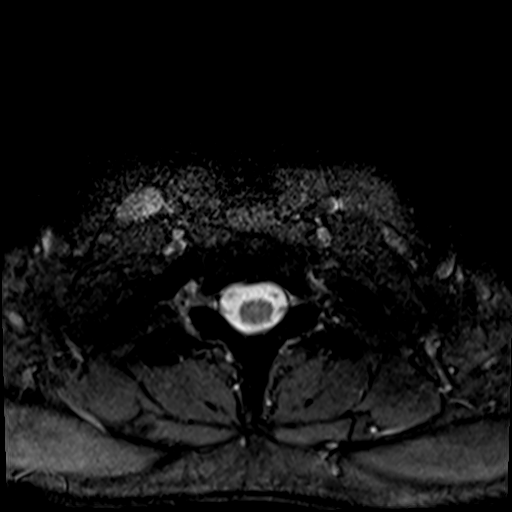

[Series 24: T1 · axial · 3.0mm · 0.35mm/px · z∈[-49,+75]mm · 6 of 36 slices shown (2 of 2)]
[im 1/36]
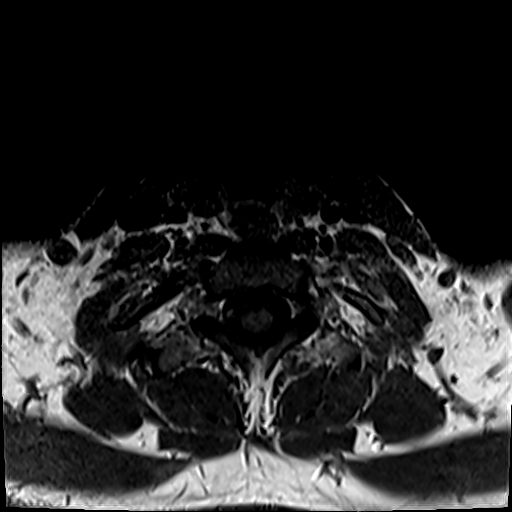
[im 8/36]
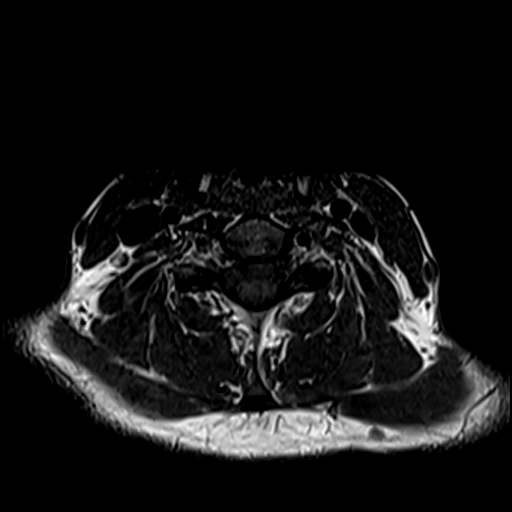
[im 15/36]
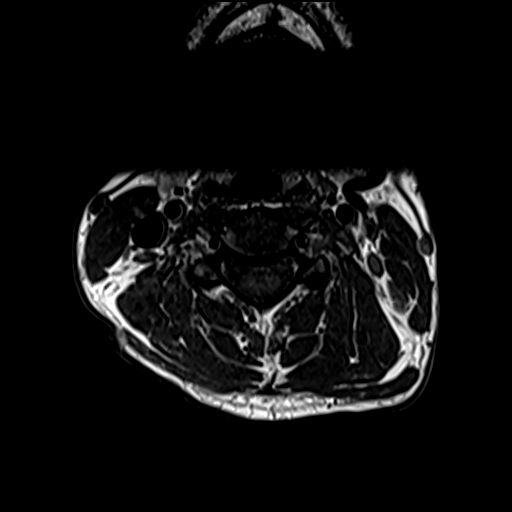
[im 22/36]
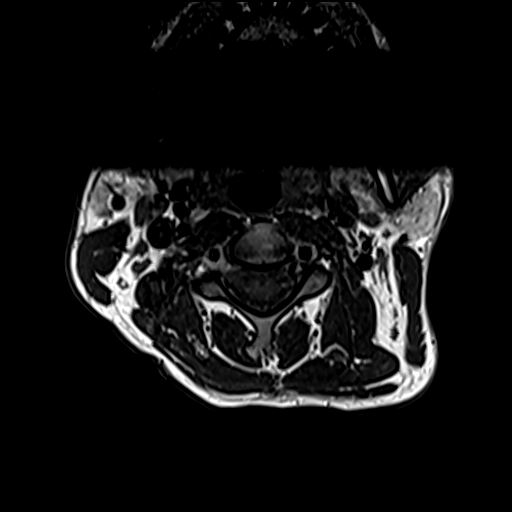
[im 29/36]
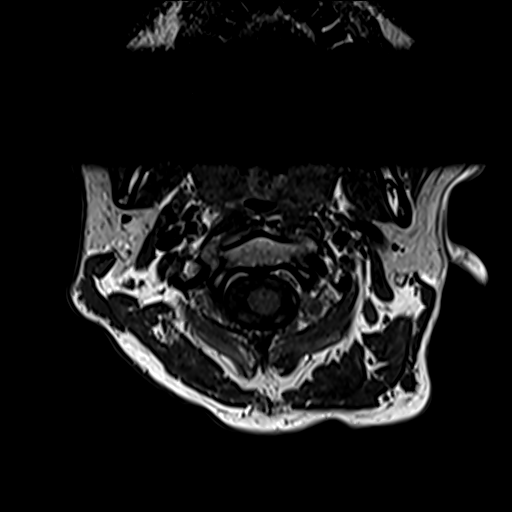
[im 36/36]
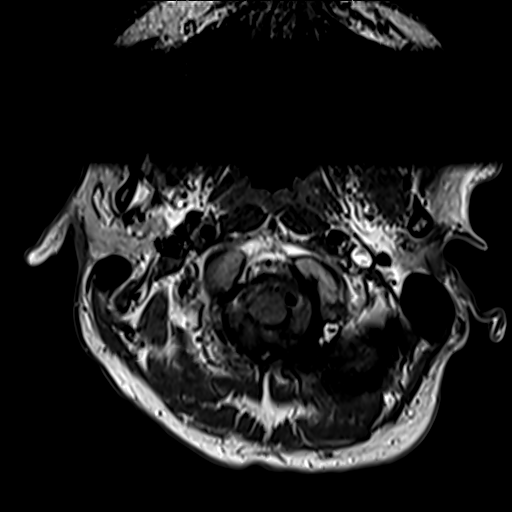

[Series 35: T1 fat-sat post-contrast · sagittal · 3.0mm · 0.69mm/px · 2 of 15 slices shown]
[im 1/15]
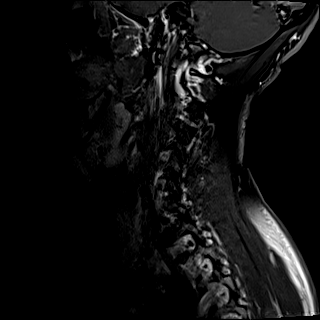
[im 15/15]
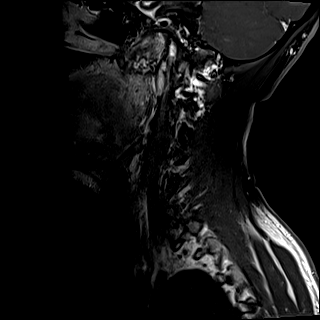

[Series 36: T1 post-contrast · axial · 3.0mm · 0.35mm/px · z∈[-49,+75]mm · 6 of 36 slices shown]
[im 1/36]
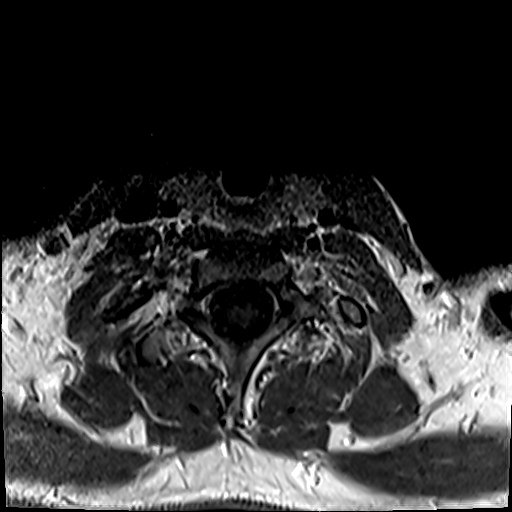
[im 8/36]
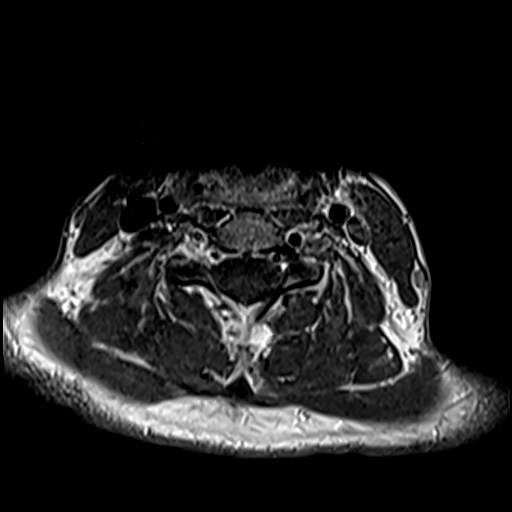
[im 15/36]
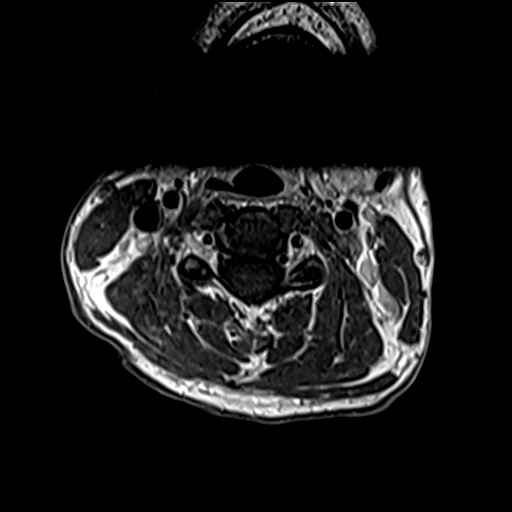
[im 22/36]
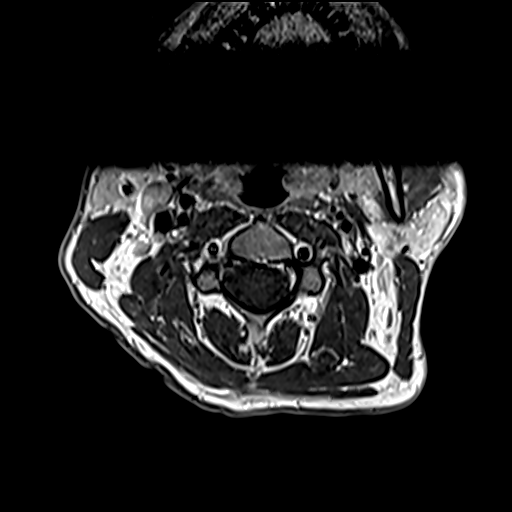
[im 29/36]
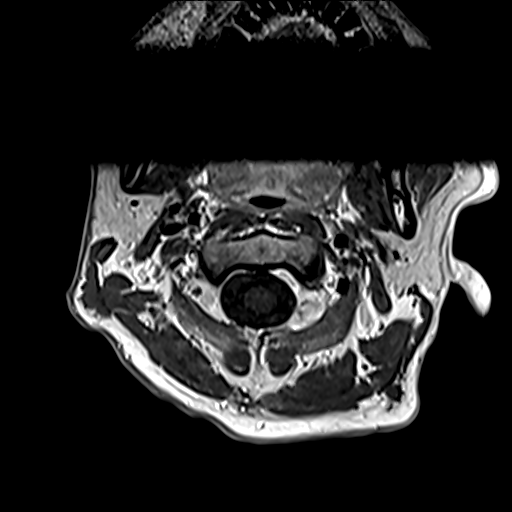
[im 36/36]
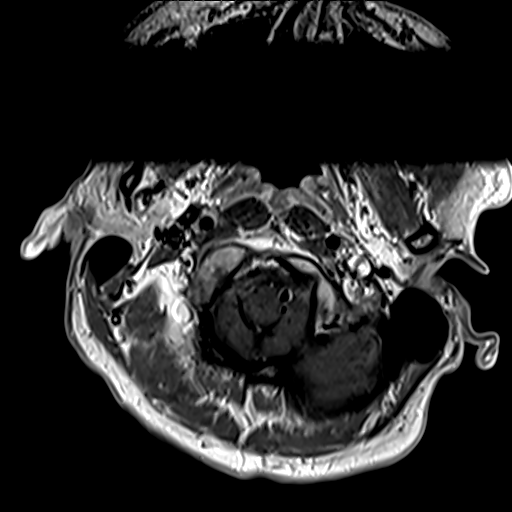

[26 of 48 positions shown; findings below may reference images not displayed]

FINDINGS: MRI CERVICAL SPINE

Alignment: No significant listhesis.

Vertebrae: Vertebral body heights are maintained. There is no marrow
edema. No suspicious osseous lesion.

Cord: Possible subtle longitudinally extensive abnormal cord T2
hyperintensity spanning cervical levels and confined to the gray
matter. No abnormal intrathecal enhancement.

Posterior Fossa, vertebral arteries, paraspinal tissues:
Unremarkable.

Disc levels: Intervertebral disc heights and signal are maintained.
No canal or foraminal stenosis at any level.

MRI THORACIC SPINE

Alignment:  Preserved.

Vertebrae: Vertebral body heights are maintained. There is no marrow
edema. No suspicious osseous lesion.

Cord:  No abnormal intrathecal enhancement.

Paraspinal and other soft tissues: Unremarkable.

Disc levels: Intervertebral disc heights and signal are maintained.
Trace central disc protrusions, for example at T4-T5. There is no
canal or foraminal stenosis at any level.

MRI LUMBAR SPINE

Segmentation:  Standard.

Alignment:  Preserved.

Vertebrae: Vertebral body heights are maintained. There is no marrow
edema. No suspicious osseous lesion.

Conus medullaris and cauda equina: Conus extends to the L1 level.
Conus and cauda equina appear normal. No abnormal intrathecal
enhancement.

Paraspinal and other soft tissues: Unremarkable.

Disc levels: Intervertebral disc heights and signal are maintained.
There is no canal or foraminal stenosis at any level.
IMPRESSION: Possible subtle longitudinally extensive abnormal cervical cord T2
hyperintensity without enhancement. Assuming this represents a true
finding rather than artifact, differential is broad and includes
infectious/post infectious and autoimmune etiologies. There are no
abnormal flow voids to suggest arteriovenous fistula.

## 2021-01-13 IMAGING — MR MR LUMBAR SPINE WO/W CM
7 of 10 series · 26 of 48 positions shown · IV contrast (6.5 GADAVIST)
Comparison: None.

CLINICAL DATA: Motor neuron disease; worsening weakness and
numbness to all extremities with falls

EXAM:
MRI CERVICAL, THORACIC AND LUMBAR SPINE WITHOUT AND WITH CONTRAST
TECHNIQUE: Multiplanar and multiecho pulse sequences of the cervical spine, to
include the craniocervical junction and cervicothoracic junction,
and thoracic and lumbar spine, were obtained without and with
intravenous contrast.
CONTRAST:  6.5mL GADAVIST GADOBUTROL 1 MMOL/ML IV SOLN

[Series 5: T1 · sagittal · 4.0mm · 0.81mm/px · 2 of 15 slices shown (1 of 2)]
[im 1/15]
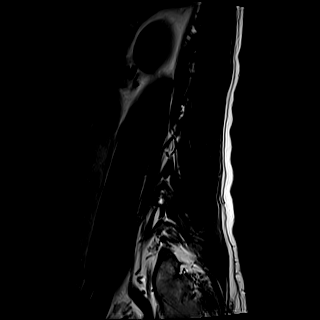
[im 15/15]
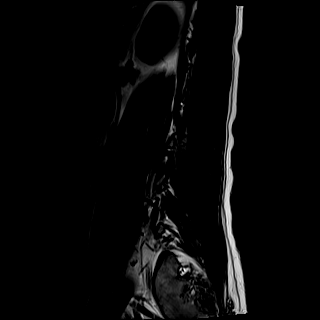

[Series 6: T2 · sagittal · 4.0mm · 0.81mm/px · 2 of 15 slices shown (1 of 2)]
[im 1/15]
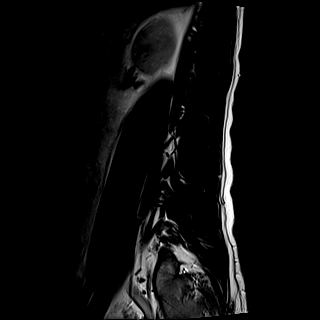
[im 15/15]
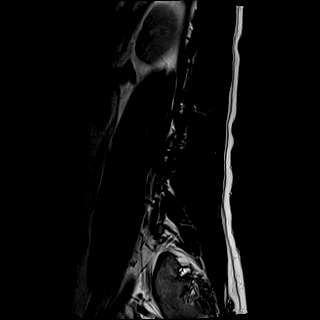

[Series 7: STIR · sagittal · 4.0mm · 0.51mm/px · 2 of 15 slices shown]
[im 1/15]
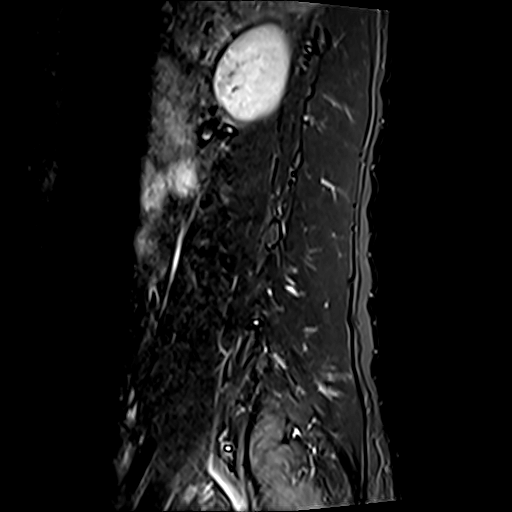
[im 15/15]
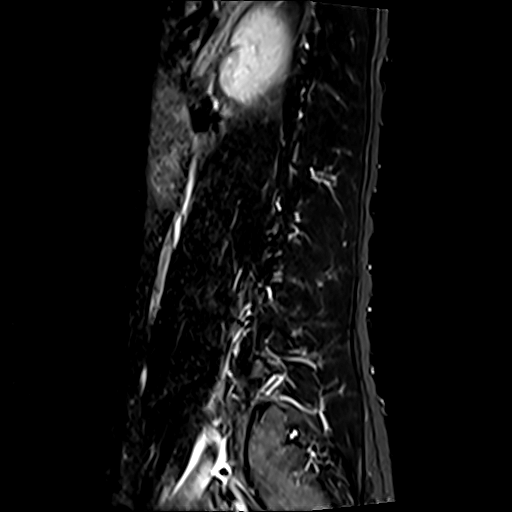

[Series 8: T2 · axial · 4.0mm · 0.62mm/px · z∈[-549,-306]mm · 7 of 48 slices shown (2 of 2)]
[im 1/48]
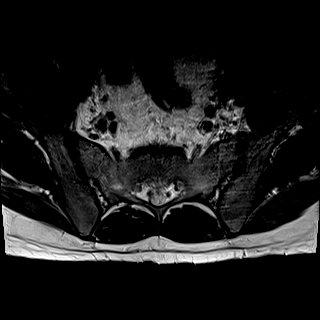
[im 8/48]
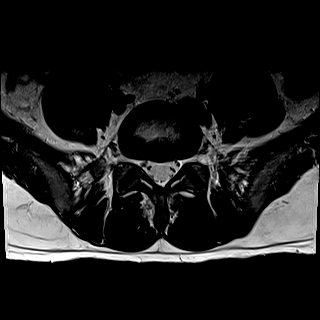
[im 16/48]
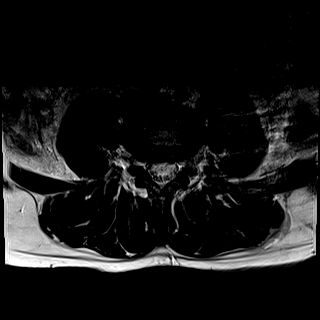
[im 24/48]
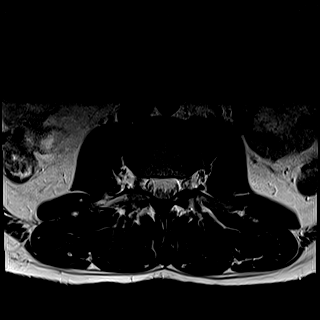
[im 32/48]
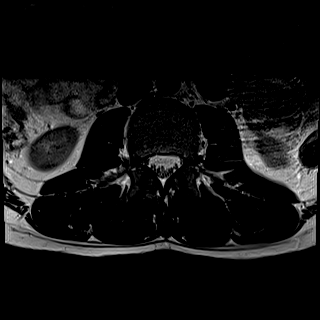
[im 40/48]
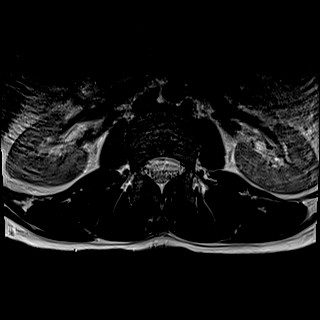
[im 48/48]
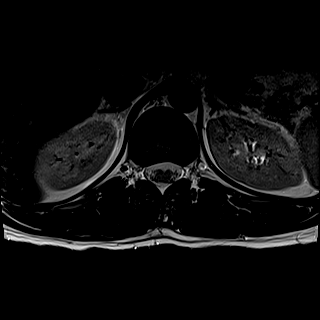

[Series 9: T1 · axial · 4.0mm · 0.39mm/px · z∈[-549,-306]mm · 7 of 48 slices shown (2 of 2)]
[im 1/48]
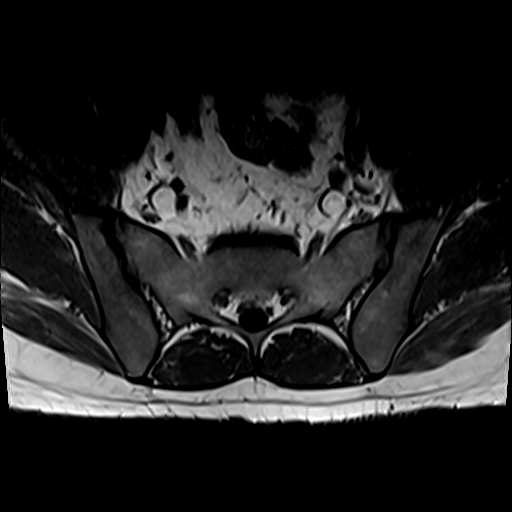
[im 8/48]
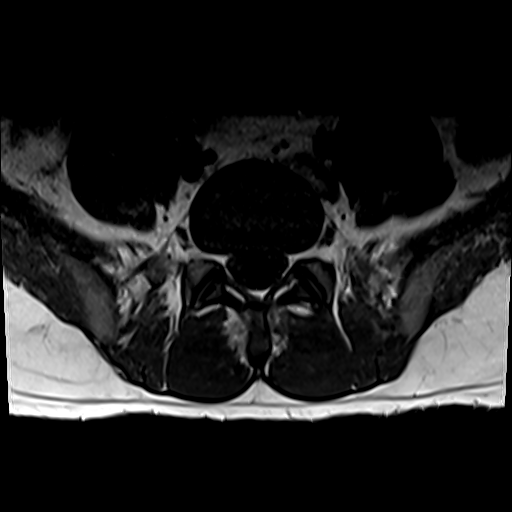
[im 16/48]
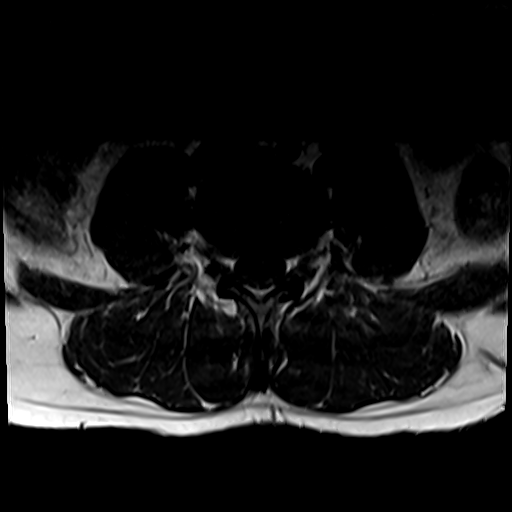
[im 24/48]
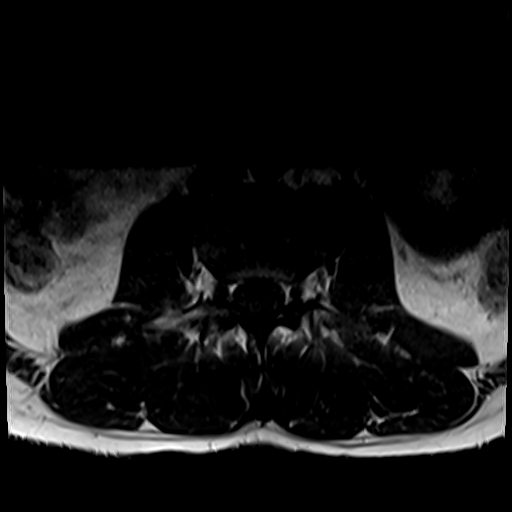
[im 32/48]
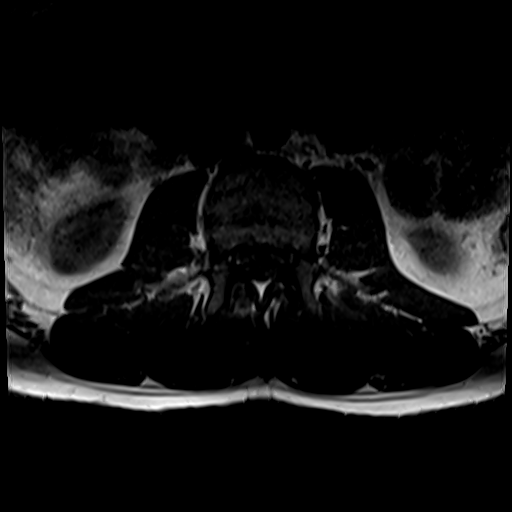
[im 40/48]
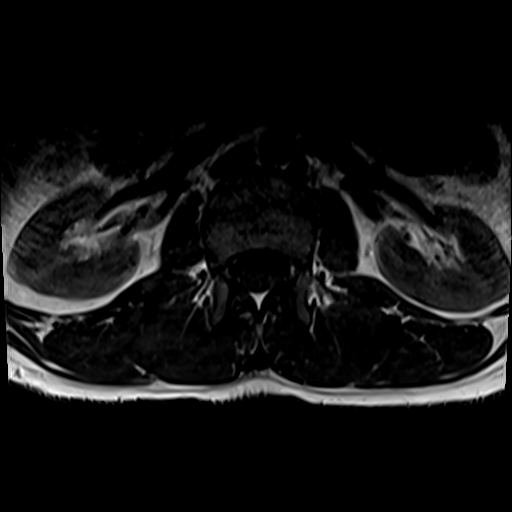
[im 48/48]
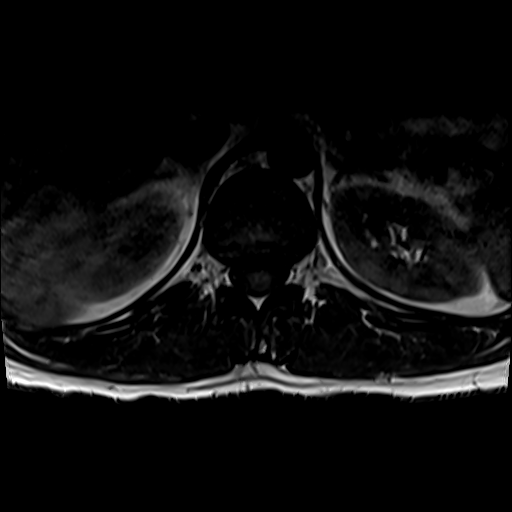

[Series 10: T1 fat-sat post-contrast · sagittal · 4.0mm · 0.81mm/px · 2 of 15 slices shown]
[im 1/15]
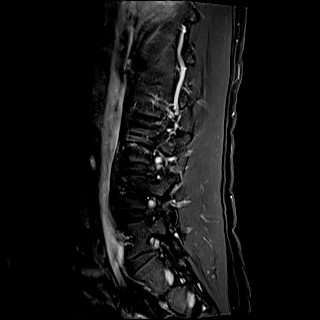
[im 15/15]
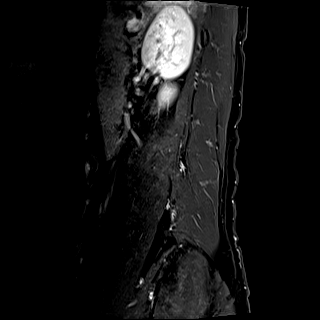

[Series 11: T1 post-contrast · axial · 4.0mm · 0.39mm/px · z∈[-549,-437]mm · 4 of 48 slices shown]
[im 1/48]
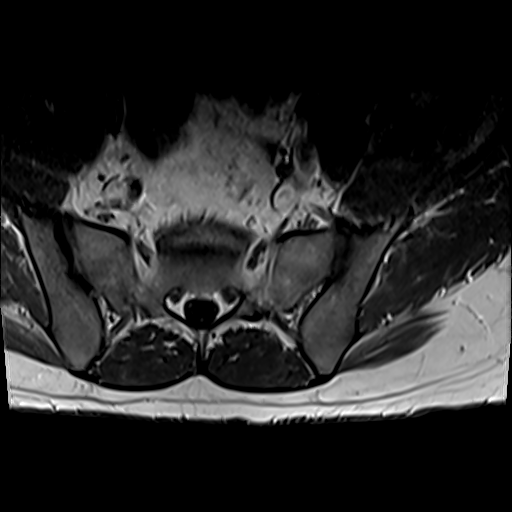
[im 8/48]
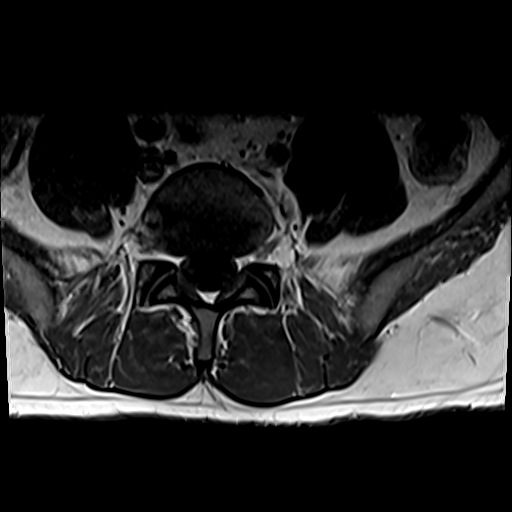
[im 16/48]
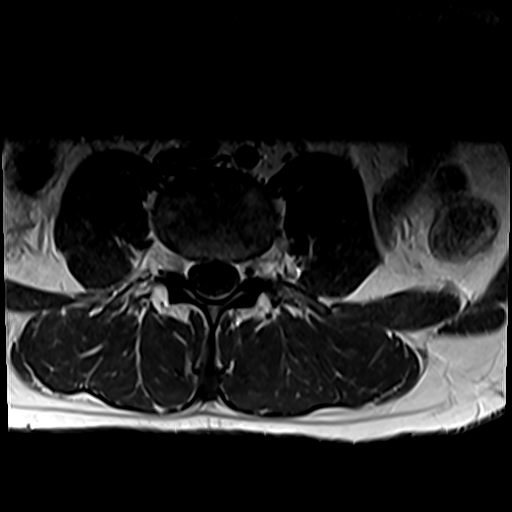
[im 24/48]
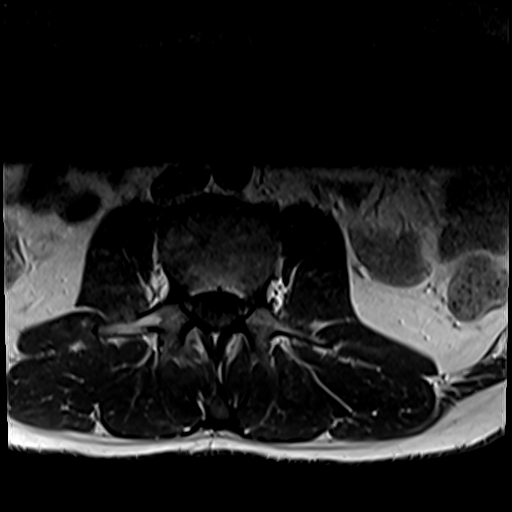

[26 of 48 positions shown; findings below may reference images not displayed]

FINDINGS: MRI CERVICAL SPINE

Alignment: No significant listhesis.

Vertebrae: Vertebral body heights are maintained. There is no marrow
edema. No suspicious osseous lesion.

Cord: Possible subtle longitudinally extensive abnormal cord T2
hyperintensity spanning cervical levels and confined to the gray
matter. No abnormal intrathecal enhancement.

Posterior Fossa, vertebral arteries, paraspinal tissues:
Unremarkable.

Disc levels: Intervertebral disc heights and signal are maintained.
No canal or foraminal stenosis at any level.

MRI THORACIC SPINE

Alignment:  Preserved.

Vertebrae: Vertebral body heights are maintained. There is no marrow
edema. No suspicious osseous lesion.

Cord:  No abnormal intrathecal enhancement.

Paraspinal and other soft tissues: Unremarkable.

Disc levels: Intervertebral disc heights and signal are maintained.
Trace central disc protrusions, for example at T4-T5. There is no
canal or foraminal stenosis at any level.

MRI LUMBAR SPINE

Segmentation:  Standard.

Alignment:  Preserved.

Vertebrae: Vertebral body heights are maintained. There is no marrow
edema. No suspicious osseous lesion.

Conus medullaris and cauda equina: Conus extends to the L1 level.
Conus and cauda equina appear normal. No abnormal intrathecal
enhancement.

Paraspinal and other soft tissues: Unremarkable.

Disc levels: Intervertebral disc heights and signal are maintained.
There is no canal or foraminal stenosis at any level.
IMPRESSION: Possible subtle longitudinally extensive abnormal cervical cord T2
hyperintensity without enhancement. Assuming this represents a true
finding rather than artifact, differential is broad and includes
infectious/post infectious and autoimmune etiologies. There are no
abnormal flow voids to suggest arteriovenous fistula.

## 2021-01-13 MED ORDER — SODIUM CHLORIDE 0.9 % IV BOLUS
1000.0000 mL | Freq: Once | INTRAVENOUS | Status: AC
  Administered 2021-01-13: 1000 mL via INTRAVENOUS

## 2021-01-13 MED ORDER — LIDOCAINE-EPINEPHRINE (PF) 2 %-1:200000 IJ SOLN
INTRAMUSCULAR | Status: AC
  Filled 2021-01-13: qty 20

## 2021-01-13 MED ORDER — GADOBUTROL 1 MMOL/ML IV SOLN
6.5000 mL | Freq: Once | INTRAVENOUS | Status: AC | PRN
  Administered 2021-01-13: 6.5 mL via INTRAVENOUS

## 2021-01-13 MED ORDER — LORAZEPAM 1 MG PO TABS
1.0000 mg | ORAL_TABLET | Freq: Once | ORAL | Status: AC
  Administered 2021-01-13: 1 mg via ORAL
  Filled 2021-01-13: qty 1

## 2021-01-13 MED ORDER — LORAZEPAM 2 MG/ML IJ SOLN: 1.0000 mg | Freq: Once | INTRAMUSCULAR | Status: AC

## 2021-01-13 MED ORDER — LORAZEPAM 2 MG/ML IJ SOLN
INTRAMUSCULAR | Status: AC
  Administered 2021-01-13: 1 mg via INTRAVENOUS
  Filled 2021-01-13: qty 1

## 2021-01-13 NOTE — ED Provider Notes (Signed)
  Physical Exam  BP 139/90   Pulse 89   Temp 98.1 F (36.7 C) (Oral)   Resp 18   Ht 1.702 m (5\' 7" )   SpO2 100%   BMI 22.24 kg/m   Physical Exam  ED Course/Procedures   Clinical Course as of 01/13/21 2331  Tue Jan 13, 2021  2131 I spoke with Dr. Jan 15, 2021, neuro hospitalist.  He states that he has never seen a demyelinating disorder that would span as long as the lesion noted on MRI.  He states that patient will need to have lumbar puncture with collecting for glucose, protein, gram stain, HSV, VZV, cell count, bacterial culture, and fungal culture.  He is also recommending that we add on HIV and RPR testing.  Based on pulmonary findings, we will need to reconsult with neurology to determine next steps. [GG]    Clinical Course User Index [GG] Wilford Corner, PA-C    .Lumbar Puncture  Date/Time: 01/13/2021 11:32 PM Performed by: 01/15/2021, MD Authorized by: Lorre Nick, MD   Consent:    Consent obtained:  Written   Consent given by:  Patient   Risks, benefits, and alternatives were discussed: yes     Alternatives discussed:  Observation Pre-procedure details:    Preparation: Patient was prepped and draped in usual sterile fashion   Anesthesia:    Anesthesia method:  Local infiltration   Local anesthetic:  Lidocaine 2% WITH epi Procedure details:    Lumbar space:  L4-L5 interspace   Needle gauge:  18   Needle type:  Spinal needle - Quincke tip   Needle length (in):  1.5   Ultrasound guidance: no     Number of attempts:  1   Fluid appearance:  Clear   Tubes of fluid:  4   Total volume (ml):  10 Post-procedure details:    Puncture site:  Adhesive bandage applied  MDM         Lorre Nick, MD 01/13/21 2333

## 2021-01-13 NOTE — ED Notes (Signed)
Patient was given instructions that he could not be up alone and that he must call for assistance.  The Nurse Tech reported that he has unsteady gait.

## 2021-01-13 NOTE — ED Provider Notes (Addendum)
I provided a substantive portion of the care of this patient.  I personally performed the entirety of the medical decision making for this encounter.      Neurology requested that patient have an LP due to his neurological features.  Had a long discussion with patient became severely anxious and was mocking and condescending towards me.  I informed him that he needs to have this test done.  Suspect that some component of drug withdrawal as his drug screen was positive for multiple substances.  Patient ambulated in the room and was able to ambulate without assistance.  Patient was strongly encouraged to return here if he should change his mind.  11:34 PM Patient change his mind and consented to have the LP done.  Procedure noted as above.  Awaiting results   Lorre Nick, MD 01/13/21 2300    Lorre Nick, MD 01/13/21 7412    Lorre Nick, MD 01/13/21 [phone]

## 2021-01-13 NOTE — ED Notes (Signed)
Pt in MRI.

## 2021-01-13 NOTE — ED Triage Notes (Signed)
Patient states he had numbness and tingling in his feet x 1 month ago. Patient states he went on vacation a week ago and had an altered gait that he describes as '''stumbly". Patient c/o numbness back, lower extremities, arms.

## 2021-01-13 NOTE — ED Notes (Signed)
Patient is currently off of the unit for MRI

## 2021-01-13 NOTE — ED Provider Notes (Cosign Needed)
[facility] Provider Note   CSN: [id number] Arrival date & time: 01/13/21  1110     History Chief Complaint  Patient presents with   Numbness    Ruben [name] is a 30 y.o. male.  The history is provided by the patient. No language interpreter was used.   30 year old male with significant history of anxiety, depression, ADHD who presents complaining of progressive weakness and numbness to his extremities.  Patient report for more than a month has noticed increasing numbness and tingling sensation to hands and feet with progressive weakness.  For the past few days he is having difficulty ambulating and falling several times without any significant injury.  He is having trouble talking.  Does not complain of any fever or chills no lightheadedness, dizziness, headache, neck pain, double vision, loss of vision, back pain, bowel bladder incontinence or saddle anesthesia.  He did report having persistent diarrhea for about a week more than a month ago prior to the onset of the symptoms.  Denies any cold symptoms.  No significant family history of autoimmune disease.  Patient denies any recent drug or alcohol use.  No history of diabetes.  He did notice having some bowel urgency for the past several days.  Past Medical History:  Diagnosis Date   ADHD (attention deficit hyperactivity disorder)    Anxiety    Depression    Suicide attempt [facility])     Patient Active Problem List   Diagnosis Date Noted   Accidental overdose 06/07/2013   Suicidal ideation 04/11/2013   Overdose 04/11/2013   Depressive disorder 04/11/2013    Past Surgical History:  Procedure Laterality Date   DENTAL SURGERY         Family History  Problem Relation Age of Onset   Hypertension Father    Cancer Paternal Grandfather     Social History   Tobacco Use   Smoking status: Former    Packs/day: 1.00    Pack years: 0.00    Types: Cigarettes   Smokeless tobacco: Never   Vaping Use   Vaping Use: Every day   Substances: Nicotine, Flavoring  Substance Use Topics   Alcohol use: Yes   Drug use: Yes    Types: Marijuana    Home Medications Prior to Admission medications   Medication Sig Start Date End Date Taking? Authorizing Provider  amphetamine-dextroamphetamine (ADDERALL XR) 30 MG 24 hr capsule Take 30 mg by mouth daily. 12/22/20  Yes [provider]  amphetamine-dextroamphetamine (ADDERALL) 15 MG tablet Take 15 mg by mouth daily. 01/08/21  Yes [provider]  escitalopram (LEXAPRO) 10 MG tablet Take 10 mg by mouth daily. 01/08/21  Yes [provider]  ibuprofen (ADVIL,MOTRIN) 200 MG tablet Take 400-800 mg by mouth every 6 (six) hours as needed for moderate pain.   Yes [provider]  lamoTRIgine (LAMICTAL) 200 MG tablet Take 200 mg by mouth daily. 01/11/21  Yes [provider]  LORazepam (ATIVAN) 2 MG tablet Take 0.5-4 mg by mouth daily as needed for anxiety. 01/08/21  Yes [provider]  polyvinyl alcohol (LIQUIFILM TEARS) 1.4 % ophthalmic solution Place 1 drop into both eyes as needed for dry eyes.   Yes [provider]  VRAYLAR 1.5 MG capsule Take 1.5 mg by mouth daily. 01/08/21  Yes [provider]  venlafaxine XR (EFFEXOR-XR) 150 MG 24 hr capsule Take 1 capsule (150 mg total) by mouth daily with breakfast. Patient not taking: Reported on 01/13/2021 12/27/13  Weber, Dema Severin, PA-C    Allergies    Abilify [aripiprazole]  Review of Systems   Review of Systems  All other systems reviewed and are negative.  Physical Exam Updated Vital Signs BP 131/64   Pulse 75   Temp 98.2 F (36.8 C) (Oral)   Resp 20   Ht 5\' 7"  (1.702 m)   SpO2 96%   BMI 22.24 kg/m   Physical Exam Vitals and nursing note reviewed.  Constitutional:      General: He is not in acute distress.    Appearance: He is well-developed.  HENT:     Head: Normocephalic and atraumatic.  Eyes:     General: No visual  field deficit.    Extraocular Movements: Extraocular movements intact.     Conjunctiva/sclera: Conjunctivae normal.     Pupils: Pupils are equal, round, and reactive to light.  Cardiovascular:     Rate and Rhythm: Normal rate and regular rhythm.     Pulses: Normal pulses.     Heart sounds: Normal heart sounds.  Pulmonary:     Effort: Pulmonary effort is normal.     Breath sounds: Normal breath sounds.  Abdominal:     Palpations: Abdomen is soft.  Musculoskeletal:     Cervical back: Normal range of motion and neck supple. No rigidity or tenderness.     Comments: 4/5 strength to all 4 extremities  Skin:    Capillary Refill: Capillary refill takes less than 2 seconds.     Findings: No rash.  Neurological:     Mental Status: He is alert and oriented to person, place, and time.     GCS: GCS eye subscore is 4. GCS verbal subscore is 5. GCS motor subscore is 6.     Cranial Nerves: No facial asymmetry.     Sensory: Sensory deficit present.     Motor: Weakness and tremor present. No pronator drift.     Coordination: Coordination is intact.     Gait: Gait abnormal.     Comments: Subjective decrease sensation to both arms and legs bilaterally.  4 out of 5 strength to all 4 extremities.  Hyperreflexic patellar deep tendon reflex bilaterally without foot drops.  Normal finger-nose, heel-to-shin.   Gait unsteady.  Speech is mild delay.    ED Results / Procedures / Treatments   Labs (all labs ordered are listed, but only abnormal results are displayed) Labs Reviewed  CBC WITH DIFFERENTIAL/PLATELET - Abnormal; Notable for the following components:      Result Value   RBC 4.01 (*)    MCH 34.9 (*)    All other components within normal limits  PHOSPHORUS - Abnormal; Notable for the following components:   Phosphorus 2.3 (*)    All other components within normal limits  COMPREHENSIVE METABOLIC PANEL  MAGNESIUM  RAPID URINE DRUG SCREEN, HOSP PERFORMED    EKG None  Radiology No results  found.  Procedures Procedures   Medications Ordered in ED Medications - No data to display  ED Course  I have reviewed the triage vital signs and the nursing notes.  Pertinent labs & imaging results that were available during my care of the patient were reviewed by me and considered in my medical decision making (see chart for details).    MDM Rules/Calculators/A&P                          BP 120/90   Pulse 66   Temp 98.2  F (36.8 C) (Oral)   Resp 18   Ht 5\' 7"  (1.702 m)   SpO2 98%   BMI 22.24 kg/m   Final Clinical Impression(s) / ED Diagnoses Final diagnoses:  None    Rx / DC Orders ED Discharge Orders     None      1:33 PM Patient report progressive weakness and numbness to all 4 extremities for nearly a month and now having trouble ambulating and has fallen several times.  Denies any recent sickness except for a week long of diarrhea more than a month ago.  No family history of MS.  Labs today are mostly reassuring.  He does exhibit decrease sensation to all 4 extremities with poor strength.  He does have hyperreflexia patellar tendon reflex.  Gait unsteady.  No facial droop no cranial deficit and no vision changes.  Appreciate consultation from on-call neurologist, Dr. , who recommend MRI of the cervical and thoracic spine with and without contrast.  He also request to be notified once resulted for further management.  2:14 PM Patient has reassuring electrolyte panels however in his UDS he is cocaine, benzodiazepine, amphetamine, and tetrahydrocannabinol positive.  Patient states last substance use was several years ago.  He did admits to alcohol use last night.   Otelia Limes, PA-C 01/13/21 [phone]

## 2021-01-13 NOTE — ED Notes (Signed)
Dr. Freida Busman at bedside performing lumbar puncture. Time out performed with this RN and Dr. Freida Busman

## 2021-01-13 NOTE — ED Notes (Signed)
Unable to update vitals due to pt gone to MRI.

## 2021-01-13 NOTE — ED Notes (Signed)
Unable to document vitals, pt in MRI

## 2021-01-13 NOTE — ED Notes (Signed)
Patient to MRI.

## 2021-01-13 NOTE — ED Notes (Signed)
Dr Effie Shy was notified of patient being claustrophobic and will be going to MRI around 430

## 2021-01-13 NOTE — ED Notes (Signed)
Patient has odd behavior- states that he is feeling grumpy and no one is going to convince him to stay overnight- " I want to sleep in my own bed".

## 2021-01-13 NOTE — ED Notes (Signed)
Spoke with [facility] in lab regarding how to send down CSF labs. Leeroy Bock is verifying if the HSV sample needs to be sent down on ice

## 2021-01-13 NOTE — ED Provider Notes (Cosign Needed Addendum)
Received patient as a handoff at shift change from Fayrene Helper, PA-C.  In short, patient is a 30 year old male with past medical history significant for anxiety and depression who presented to the ED with progressive weakness and numbness in his extremities.  He reports that his symptoms have been progressively worsening over the course of a month and is now having difficulty ambulating and increased falls at home.  He did endorse history of persistent diarrhea lasting 1 week that was approximately 1 month prior to onset of his symptoms.  No other obvious precipitating illness or infection.  Denies any history of drug or alcohol use.  Physical exam per handoff provider was notable for 4 out of 5 strength in all extremities.  Strength was symmetrically weak.  There is also subjective decreased sensation to both arms and legs bilaterally.  Hyperreflexic patellar DTRs bilaterally, no foot drop.  Given his collection of symptoms, Dr. Otelia Limes, neurology, was consulted who recommended MRI of the cervical and thoracic spine.  MRI lumbar spine was also added.  He asked that he be reconsulted for further management once MRI results.  While patient had denied any illicit drug use, his urine drug screen was notable for cocaine, benzodiazepines, amphetamines, and marijuana.  Physical Exam  BP 139/90   Pulse 89   Temp 98.1 F (36.7 C) (Oral)   Resp 18   Ht 5\' 7"  (1.702 m)   SpO2 100%   BMI 22.24 kg/m   Physical Exam Vitals and nursing note reviewed. Exam conducted with a chaperone present.  Constitutional:      Appearance: Normal appearance.  HENT:     Head: Normocephalic and atraumatic.  Eyes:     General: No scleral icterus.    Conjunctiva/sclera: Conjunctivae normal.  Pulmonary:     Effort: Pulmonary effort is normal.  Skin:    General: Skin is dry.  Neurological:     Mental Status: He is alert and oriented to person, place, and time.     GCS: GCS eye subscore is 4. GCS verbal subscore is  5. GCS motor subscore is 6.     Sensory: No sensory deficit.     Coordination: Coordination normal.     Gait: Gait abnormal.     Comments: CN II through XII grossly intact.  No abnormal coordination.  Negative Romberg and cerebellar exams.  Good finger-to-nose exam.  Good strength throughout.  He does have abnormal gait.  Psychiatric:        Mood and Affect: Mood normal.        Behavior: Behavior normal.        Thought Content: Thought content normal.    ED Course/Procedures   Clinical Course as of 01/13/21 2353  Tue Jan 13, 2021  2131 I spoke with Dr. Jan 15, 2021, neuro hospitalist.  He states that he has never seen a demyelinating disorder that would span as long as the lesion noted on MRI.  He states that patient will need to have lumbar puncture with collecting for glucose, protein, gram stain, HSV, VZV, cell count, bacterial culture, and fungal culture.  He is also recommending that we add on HIV and RPR testing.  Based on LP findings, we will need to reconsult with neurology to determine next steps. [GG]    Clinical Course User Index [GG] Wilford Corner, PA-C    Procedures Results for orders placed or performed during the hospital encounter of 01/13/21  Comprehensive metabolic panel  Result Value Ref Range   Sodium 138  135 - 145 mmol/L   Potassium 3.9 3.5 - 5.1 mmol/L   Chloride 103 98 - 111 mmol/L   CO2 27 22 - 32 mmol/L   Glucose, Bld 91 70 - 99 mg/dL   BUN 11 6 - 20 mg/dL   Creatinine, Ser 5.27 0.61 - 1.24 mg/dL   Calcium 9.2 8.9 - 78.2 mg/dL   Total Protein 7.7 6.5 - 8.1 g/dL   Albumin 4.5 3.5 - 5.0 g/dL   AST 20 15 - 41 U/L   ALT 27 0 - 44 U/L   Alkaline Phosphatase 43 38 - 126 U/L   Total Bilirubin 0.4 0.3 - 1.2 mg/dL   GFR, Estimated >42 >35 mL/min   Anion gap 8 5 - 15  CBC with Differential  Result Value Ref Range   WBC 4.3 4.0 - 10.5 K/uL   RBC 4.01 (L) 4.22 - 5.81 MIL/uL   Hemoglobin 14.0 13.0 - 17.0 g/dL   HCT 36.1 44.3 - 15.4 %   MCV 99.8 80.0 - 100.0 fL    MCH 34.9 (H) 26.0 - 34.0 pg   MCHC 35.0 30.0 - 36.0 g/dL   RDW 00.8 67.6 - 19.5 %   Platelets 268 150 - 400 K/uL   nRBC 0.0 0.0 - 0.2 %   Neutrophils Relative % 47 %   Neutro Abs 2.0 1.7 - 7.7 K/uL   Lymphocytes Relative 40 %   Lymphs Abs 1.7 0.7 - 4.0 K/uL   Monocytes Relative 10 %   Monocytes Absolute 0.4 0.1 - 1.0 K/uL   Eosinophils Relative 3 %   Eosinophils Absolute 0.1 0.0 - 0.5 K/uL   Basophils Relative 0 %   Basophils Absolute 0.0 0.0 - 0.1 K/uL   Immature Granulocytes 0 %   Abs Immature Granulocytes 0.00 0.00 - 0.07 K/uL  Urine rapid drug screen (hosp performed)  Result Value Ref Range   Opiates NONE DETECTED NONE DETECTED   Cocaine POSITIVE (A) NONE DETECTED   Benzodiazepines POSITIVE (A) NONE DETECTED   Amphetamines POSITIVE (A) NONE DETECTED   Tetrahydrocannabinol POSITIVE (A) NONE DETECTED   Barbiturates NONE DETECTED NONE DETECTED  Magnesium  Result Value Ref Range   Magnesium 2.2 1.7 - 2.4 mg/dL  Phosphorus  Result Value Ref Range   Phosphorus 2.3 (L) 2.5 - 4.6 mg/dL   CT Head Wo Contrast  Result Date: 01/13/2021 CLINICAL DATA:  30 year old male with ataxia. EXAM: CT HEAD WITHOUT CONTRAST TECHNIQUE: Contiguous axial images were obtained from the base of the skull through the vertex without intravenous contrast. COMPARISON:  Head CT dated 08/27/2009. FINDINGS: Brain: No evidence of acute infarction, hemorrhage, hydrocephalus, extra-axial collection or mass lesion/mass effect. Vascular: No hyperdense vessel or unexpected calcification. Skull: Normal. Negative for fracture or focal lesion. Sinuses/Orbits: No acute finding. Other: None IMPRESSION: Normal noncontrast CT of the brain. Electronically Signed   By: Elgie Collard M.D.   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 CONTRAST:  6.37mL GADAVIST GADOBUTROL 1 MMOL/ML IV SOLN COMPARISON:  None. FINDINGS: MRI CERVICAL SPINE Alignment: No significant listhesis. Vertebrae: Vertebral body heights are maintained. There is no marrow  edema. No suspicious osseous lesion. Cord: Possible subtle longitudinally extensive abnormal cord T2 hyperintensity spanning cervical levels and confined to the gray matter. No abnormal intrathecal enhancement. Posterior Fossa, vertebral arteries, paraspinal tissues: Unremarkable. Disc levels: Intervertebral disc heights and signal are maintained. No canal or foraminal stenosis at any level. MRI THORACIC SPINE Alignment:  Preserved. Vertebrae: Vertebral body heights are maintained. There is no marrow edema. No suspicious osseous lesion. Cord:  No abnormal intrathecal enhancement. Paraspinal and other soft tissues: Unremarkable. Disc levels: Intervertebral disc heights and signal are maintained. Trace central disc protrusions, for example at T4-T5. There is no canal or foraminal stenosis at any level. MRI LUMBAR SPINE Segmentation:  Standard. Alignment:  Preserved. Vertebrae: Vertebral body heights are maintained. There is no marrow edema. No suspicious osseous lesion. Conus medullaris and cauda equina: Conus extends to the L1 level. Conus and cauda equina appear normal. No abnormal intrathecal enhancement. Paraspinal and other soft tissues: Unremarkable. Disc levels: Intervertebral disc heights and signal are maintained. There is no canal or foraminal stenosis at any level. IMPRESSION: Possible subtle longitudinally extensive abnormal cervical cord T2 hyperintensity without enhancement. Assuming this represents a true finding rather than artifact, differential  is broad and includes infectious/post infectious and autoimmune etiologies. There are no abnormal flow voids to suggest arteriovenous fistula. Electronically Signed   By: Guadlupe Spanish M.D.   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 CONTRAST:  6.43mL GADAVIST GADOBUTROL 1 MMOL/ML IV SOLN COMPARISON:  None. FINDINGS: MRI CERVICAL SPINE Alignment: No significant listhesis. Vertebrae: Vertebral body heights are maintained. There is no marrow edema. No suspicious osseous lesion. Cord: Possible subtle longitudinally extensive abnormal cord T2 hyperintensity spanning cervical levels and confined to the gray matter. No abnormal intrathecal enhancement. Posterior Fossa, vertebral arteries, paraspinal tissues: Unremarkable. Disc levels: Intervertebral disc heights and signal are maintained. No canal or foraminal stenosis at any level. MRI THORACIC SPINE Alignment:  Preserved. Vertebrae: Vertebral body heights are maintained. There is no marrow edema. No suspicious osseous lesion. Cord:  No abnormal intrathecal enhancement. Paraspinal and other soft tissues: Unremarkable. Disc levels: Intervertebral disc heights and signal are maintained. Trace central disc protrusions, for example at T4-T5. There is no canal or foraminal stenosis at any level. MRI LUMBAR SPINE Segmentation:  Standard. Alignment:  Preserved. Vertebrae: Vertebral body heights are maintained. There is no marrow edema. No suspicious osseous lesion. Conus medullaris and cauda equina: Conus extends to the L1 level. Conus and  cauda equina appear normal. No abnormal intrathecal enhancement. Paraspinal and other soft tissues: Unremarkable. Disc levels: Intervertebral disc heights and signal are maintained. There is no canal or foraminal stenosis at any level. IMPRESSION: Possible subtle longitudinally extensive abnormal cervical cord T2 hyperintensity without enhancement. Assuming this represents a true finding rather than artifact, differential is broad and includes infectious/post infectious and autoimmune etiologies. There are no abnormal flow voids to suggest arteriovenous fistula. Electronically Signed   By: Guadlupe Spanish M.D.   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 CONTRAST:  6.945mL GADAVIST GADOBUTROL 1 MMOL/ML IV SOLN COMPARISON:  None. FINDINGS: MRI CERVICAL SPINE Alignment: No significant listhesis. Vertebrae: Vertebral body heights are maintained. There is no marrow edema. No suspicious osseous lesion. Cord: Possible subtle longitudinally extensive abnormal cord T2 hyperintensity spanning cervical levels and confined to the gray matter. No abnormal intrathecal enhancement. Posterior Fossa, vertebral arteries, paraspinal tissues: Unremarkable. Disc levels: Intervertebral disc heights and signal are maintained. No canal or foraminal stenosis at any level. MRI THORACIC SPINE Alignment:  Preserved. Vertebrae: Vertebral body heights are maintained. There is no marrow edema. No suspicious osseous lesion. Cord:  No abnormal intrathecal enhancement. Paraspinal and other soft tissues: Unremarkable.  Disc levels: Intervertebral disc heights and signal are maintained. Trace central disc protrusions, for example at T4-T5. There is no canal or foraminal stenosis at any level. MRI LUMBAR SPINE Segmentation:  Standard. Alignment:  Preserved. Vertebrae: Vertebral body heights are maintained. There is no marrow edema. No suspicious osseous lesion. Conus medullaris and cauda equina: Conus extends to the L1 level. Conus and cauda equina appear normal. No abnormal intrathecal enhancement. Paraspinal and other soft tissues: Unremarkable. Disc levels: Intervertebral disc heights and signal are maintained. There is no canal or foraminal stenosis at any level. IMPRESSION: Possible subtle longitudinally extensive abnormal cervical cord T2 hyperintensity without enhancement. Assuming this represents a true finding rather than artifact, differential is broad and includes infectious/post infectious and autoimmune etiologies. There are no abnormal flow voids to suggest arteriovenous fistula. Electronically Signed   By: Guadlupe SpanishPraneil  Patel M.D.   On: 01/13/2021 20:55    MDM   On my examination, patient reports that he developed peripheral "tingling" and numbness approximately 1 month ago.  In the past week it has been accelerating and is now manifesting with weakness and difficulty ambulating.  He endorses generalized weakness throughout.  I reviewed patient's medical record.  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  He has a history of IV heroin use, but has abstained for 5 years.  He is however positive for cannabis and cocaine use in addition to his prescribed benzodiazepines and ADHD medication.  He has chronic hepatitis C as a result of his IV heroin use.  MRI obtained of cervical, thoracic, and lumbar spine is concerning for possible subtle longitudinally extensive abnormal cervical cord T2  hyperintensity throughout enhancement.  Assuming this is true rather than artifact, differential is broad and includes infectious/postinfectious and autoimmune etiologies.  Will reconsult neurology regarding these findings.  I spoke with Dr. Wilford CornerArora, neuro hospitalist.  He states that he has never seen a demyelinating disorder that would span as long as the lesion noted on MRI.  He states that patient will need to have lumbar puncture with collecting for glucose, protein, gram stain, HSV, VZV, cell count, bacterial culture, and fungal culture.  He is also recommending that we add on HIV and RPR testing.  Based on pulmonary findings, we will need to reconsult with neurology to determine next steps.  Patient will require head CT without contrast prior to LP. Dr. Freida BusmanAllen will perform the LP.  Patient decided that he wanted to go after the CT. We spoke with him  at bedside, but he is adamant that he wanted to go home.  Patient wishes to leave AGAINST MEDICAL ADVICE. I personally explained need for further testing and my concerns for adverse outcomes if workup is incomplete. Specific concerns explained to patient include worsening symptoms, functional loss, long term sickness and death. Patient states understanding of risks and states they will return if they feel the need to at a later date to receive the recommended care or any other care at any time, regardless of their ability to pay for such care. Patient understands they are able to return at any time. Patient is able to explain back the risks of leaving AMA and still wishes to leave.   Specifically I recommend LP to evaluate and exclude inflammatory condition or other emergent, potentially life or limb threatening disease.  After patient initially wanted to leave AMA and discharge papers were printed, he decided to change his mind and remain for the lumbar puncture which was performed by Dr. Freida Busman.  Plan is to reconsult with neurology pending results of  LP. At shift change care was transferred to [facility], PA-C who will follow pending studies, re-evaluate, and determine disposition.      Lorelee New, PA-C 01/13/21 2259    Lorelee New, PA-C 01/13/21 2334    Lorelee New, PA-C 01/13/21 2354

## 2021-01-13 NOTE — ED Notes (Signed)
Patient continues to wait for MRI.

## 2021-01-13 NOTE — Discharge Instructions (Addendum)
You have chosen to leave AGAINST MEDICAL ADVICE. Should you change your mind, you are always welcome and encouraged to return to the ED.  We have given you a shot of vitamin B12 as we suspect a deficiency of this may be contributing to your symptoms.  Do not use whippets or other drugs as these also could be causing/contributing to your symptoms.  Please include vitamin B12 rich foods in your diet.  You may start taking an oral over-the-counter supplement for B12-please discuss with pharmacist.  We ideally would like you to have another shot of B12 in 1 week- this can be done through neurology or primary care.   Please call your neurologist to schedule an appointment for follow-up for as soon as possible.  We have also placed a referral to [facility] health neurology group to try to help facilitate follow-up as well.  You may return to the emergency department at any time. Please return immediately for any new or worsening symptoms or any other concerns.

## 2021-01-13 NOTE — ED Provider Notes (Cosign Needed)
Emergency Medicine Provider Triage Evaluation Note  Ruben [name] , a 30 y.o. male  was evaluated in triage.  Pt complains of weakness.  Review of Systems  Positive: Weakness to feet/hands with decreased sensation, fall Negative: Fever, headache, vision changes, neck pain  Physical Exam  BP (!) 127/97 (BP Location: Left Arm)   Pulse 68   Temp 98.2 F (36.8 C) (Oral)   Resp 18   Ht 5\' 7"  (1.702 m)   SpO2 100%   BMI 22.24 kg/m  Gen:   Awake, no distress   Resp:  Normal effort  MSK:   Moves extremities without difficulty  Other:  Decreased grip strength bilaterally, 3/5 strength to BLE with decreased sensation, without foot drops.  Patella DTR Hyperreflexive bilaterally  Medical Decision Making  Medically screening exam initiated at 11:31 AM.  Appropriate orders placed.  Ruben [name] was informed that the remainder of the evaluation will be completed by another provider, this initial triage assessment does not replace that evaluation, and the importance of remaining in the ED until their evaluation is complete.  Progressive worsening weakness and numbness to all 4 extremities x1 month, now having difficulty walking and have been falling.  No headache, neck pain, visual changes.  No recent sickness except several days of diarrhea.  Likely benefit from neuro consult.    Despina Pole, PA-C 01/13/21 1136

## 2021-01-13 NOTE — ED Notes (Signed)
Snacks provided to pt and pt visitor

## 2021-01-14 DIAGNOSIS — R200 Anesthesia of skin: Secondary | ICD-10-CM

## 2021-01-14 LAB — CSF CELL COUNT WITH DIFFERENTIAL
RBC Count, CSF: 0 /mm3
RBC Count, CSF: 0 /mm3
Tube #: 1
Tube #: 1
WBC, CSF: 2 /mm3 (ref 0–5)
WBC, CSF: 2 /mm3 (ref 0–5)

## 2021-01-14 LAB — PROTEIN AND GLUCOSE, CSF
Glucose, CSF: 51 mg/dL (ref 40–70)
Total  Protein, CSF: 44 mg/dL (ref 15–45)

## 2021-01-14 MED ORDER — CYANOCOBALAMIN 1000 MCG/ML IJ SOLN
1000.0000 ug | Freq: Once | INTRAMUSCULAR | Status: AC
  Administered 2021-01-14: 1000 ug via INTRAMUSCULAR
  Filled 2021-01-14: qty 1

## 2021-01-14 NOTE — ED Provider Notes (Signed)
23:50: Assumed care of patient at change of shift pending CSF results and rediscussion with neurology.  Please see prior provider note for full H&P.  Briefly patient is a 30 year old male with a history of anxiety and depression who presented to the emergency department with progressive weakness/numbness in his extremities.  The case has been discussed with neurology, ultimately had abnormal MRI imaging, was recommended to have an LP performed.  I re-discussed with neurologist Dr. 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  Dr. Wilford Corner has ordered additional laboratory studies, recommendation is for patient to be admitted for observation with these test pending and for an MRI of the brain tomorrow morning, once his blood work is drawn recommend vitamin B12 1000 mcg intramuscularly be administered.  I discussed results and plan of care with the patient thus far, he is adamantly refusing to remain in the hospital, I had an extended conversation with he and his girlfriend @ the bedside.   Patient signing out AGAINST MEDICAL ADVICE- The patient and I discussed the nature and purpose, risks and benefits, as well as, the alternatives of treatment. Time was given to allow the opportunity to ask questions and consider options. After the discussion, the patient decided to refuse to remain in the hospital for observation & continued testing. The patient was informed that refusal could lead to, but was not limited to, death, permanent disability, paralysis, or severe pain. Prior to refusing, I determined that the patient had the capacity to make their decision and understood the consequences of that decision. After refusal, I made every  reasonable effort to treat them to the best of my ability.    Patient willing to have additional blood work drawn and to receive IM B12 injection in the emergency department prior to leaving.He states that he has a neurologist appointment later this month, encouraged him to call his neurologist to make a sooner follow-up appointment, he did not think this to be possible, I placed an ambulatory referral to [facility] neurology to try to establish closer follow-up for this patient.  He was advised to avoid any drug use, specifically whippets.   The patient was notified that they may return to the emergency department at any time for further eval/treatment.    Findings and plan of care discussed with supervising physician Dr. Quenten Raven, Pleas Koch, PA-C 01/14/21 0327    Gilda Crease, MD 01/14/21 [phone]

## 2021-01-14 NOTE — Consult Note (Addendum)
Neurology Consultation  Reason for Consult: Numbness and tingling in hands and feet with progressive weakness Referring Physician: Dr. Blinda Leatherwood  CC: Numbness tingling in hands and feet, progressive weakness.  History is obtained from: Patient  HPI: Ruben [name] is a 30 y.o. male past medical history of anxiety, depression, prior suicide attempt, ADHD, cocaine abuse, presenting to the emergency room for evaluation of tingling numbness and weakness. He reports that about a week or so ago, he started noticing that he has had numbness of both his legs worse on the left compared to the right.  On further questioning, he says that he has started noticing this about 2 months ago, beginning of April this year when they were at the beach and his girlfriend noticed that whenever she was walking behind him, his flip-flops kept slipping out and he was dragging his left foot.  He said that this started to get better-initially started with numbness and then started to become more pins and needle sensation so he did not make much of it but over the last week or so his right hand started to cramp up on him and he had become more clumsy with both his upper extremities as well which worried him and brought him for further evaluation to the emergency room. On asking him any preceding illnesses, he says that in the beginning of the April he had some diarrhea but was not sure if it was due to some food poisoning or some sort of a stomach flu. Asking him more detail from his childhood and young adulthood, he says that he seemed to have a tingling and numbness in his legs which he thought was from poor circulation-his legs would be more numb and tingly after keeping the legs crossed than he would expect. He did not make much of it and never sought medical attention. He is currently on benzodiazepines and amphetamines, which he also tested positive in his urine. His urine also was positive for THC and cocaine but he  denies cocaine use. He also used to inhale whippets up until a week or so ago.  Imaging obtained in the emergency room in the form of MRI of the C-spine, T-spine and L-spine with and without contrast was significant for a subtle longitudinally extensive hyperintensity in the cervical spinal cord through its entire length even up to the initial part of the thoracic spinal cord-without any abnormal enhancement.  Neurological consultation was obtained for these findings. I recommended that a spinal tap be performed and CSF be analyzed to rule out any infectious or inflammatory process.  Preliminary results from the CSF studies do not look indicative of a ongoing inflammatory or infectious process.  ROS: Full ROS was performed and is negative except as noted in the HPI.   Past Medical History:  Diagnosis Date   ADHD (attention deficit hyperactivity disorder)    Anxiety    Depression    Suicide attempt (HCC)         Family History  Problem Relation Age of Onset   Hypertension Father    Cancer Paternal Grandfather      Social History:   reports that he has quit smoking. His smoking use included cigarettes. He smoked an average of 1.00 packs per day. He has never used smokeless tobacco. He reports current alcohol use. He reports current drug use. Drug: Marijuana.  Medications No current facility-administered medications for this encounter.  Current Outpatient Medications:    amphetamine-dextroamphetamine (ADDERALL XR) 30 MG 24 hr  capsule, Take 30 mg by mouth daily., Disp: , Rfl:    amphetamine-dextroamphetamine (ADDERALL) 15 MG tablet, Take 15 mg by mouth daily., Disp: , Rfl:    escitalopram (LEXAPRO) 10 MG tablet, Take 10 mg by mouth daily., Disp: , Rfl:    ibuprofen (ADVIL,MOTRIN) 200 MG tablet, Take 400-800 mg by mouth every 6 (six) hours as needed for moderate pain., Disp: , Rfl:    lamoTRIgine (LAMICTAL) 200 MG tablet, Take 200 mg by mouth daily., Disp: , Rfl:    LORazepam  (ATIVAN) 2 MG tablet, Take 0.5-4 mg by mouth daily as needed for anxiety., Disp: , Rfl:    polyvinyl alcohol (LIQUIFILM TEARS) 1.4 % ophthalmic solution, Place 1 drop into both eyes as needed for dry eyes., Disp: , Rfl:    VRAYLAR 1.5 MG capsule, Take 1.5 mg by mouth daily., Disp: , Rfl:    venlafaxine XR (EFFEXOR-XR) 150 MG 24 hr capsule, Take 1 capsule (150 mg total) by mouth daily with breakfast. (Patient not taking: Reported on 01/13/2021), Disp: 30 capsule, Rfl: 0   Exam: Current vital signs: BP 132/87   Pulse 79   Temp 98.1 F (36.7 C) (Oral)   Resp 18   Ht 5\' 7"  (1.702 m)   SpO2 100%   BMI 22.24 kg/m  Vital signs in last 24 hours: Temp:  [98.1 F (36.7 C)-98.2 F (36.8 C)] 98.1 F (36.7 C) (06/14 2104) Pulse Rate:  [59-89] 79 (06/14 2355) Resp:  [10-31] 18 (06/14 2355) BP: (111-143)/(75-119) 132/87 (06/14 2355) SpO2:  [94 %-100 %] 100 % (06/14 2355) GENERAL: Awake, alert in NAD HEENT: - Normocephalic and atraumatic, dry mm, no LN++, no Thyromegally LUNGS - Clear to auscultation bilaterally with no wheezes CV - S1S2 RRR, no m/r/g, equal pulses bilaterally. ABDOMEN - Soft, nontender, nondistended with normoactive BS Ext: warm, well perfused, intact peripheral pulses, _no pedal edema  NEURO:  Mental Status: AA&Ox3  Language: speech is nondysarthric.  Naming, repetition, fluency, and comprehension intact. Cranial Nerves: PERRL, no evidence of RAPD, EOMI, visual fields full, no facial asymmetry, facial sensation intact, hearing intact, tongue/uvula/soft palate midline, normal sternocleidomastoid and trapezius muscle strength. No evidence of tongue atrophy or fibrillations Motor: No drift in bilateral upper extremities with 5/5 strength.  There is subtle weakness in the left lower extremity at the hip flexors and knee flexors with 4/5 plantar and dorsiflexors on the left.  Right lower extremity with coaching is nearly full strength. Tone: is normal and bulk is  normal Sensation-sensation is diminished to touch on the left lower extremity in comparison to the right lower extremity and there is also areas of hyperesthesia on the lower extremities bilaterally when compared to the upper extremity.  There is also loss of vibratory sense in the lower extremities distally. Coordination: There is mild finger-to-nose and heel-knee-shin Gait- deferred but according to the ED notes was ataxic when walked in. DTR: Unable to elicit upper extremity DTRs.  1+ bilateral knee jerks and absent ankle jerks bilaterally with equivocal toes.  Hoffmann's negative.  Labs I have reviewed labs in epic and the results pertinent to this consultation are: MCV nearly upper limit of normal with no anemia.  CBC    Component Value Date/Time   WBC 4.3 01/13/2021 1203   RBC 4.01 (L) 01/13/2021 1203   HGB 14.0 01/13/2021 1203   HCT 40.0 01/13/2021 1203   PLT 268 01/13/2021 1203   MCV 99.8 01/13/2021 1203   MCV 92.6 01/16/2014 1910   MCH 34.9 (  H) 01/13/2021 1203   MCHC 35.0 01/13/2021 1203   RDW 14.0 01/13/2021 1203   LYMPHSABS 1.7 01/13/2021 1203   MONOABS 0.4 01/13/2021 1203   EOSABS 0.1 01/13/2021 1203   BASOSABS 0.0 01/13/2021 1203    CMP     Component Value Date/Time   NA 138 01/13/2021 1203   K 3.9 01/13/2021 1203   CL 103 01/13/2021 1203   CO2 27 01/13/2021 1203   GLUCOSE 91 01/13/2021 1203   BUN 11 01/13/2021 1203   CREATININE 0.88 01/13/2021 1203   CALCIUM 9.2 01/13/2021 1203   PROT 7.7 01/13/2021 1203   ALBUMIN 4.5 01/13/2021 1203   AST 20 01/13/2021 1203   ALT 27 01/13/2021 1203   ALKPHOS 43 01/13/2021 1203   BILITOT 0.4 01/13/2021 1203   GFRNONAA >60 01/13/2021 1203   GFRAA >90 02/06/2014 0238    Urinary toxicology screen Positive for cocaine Positive for THC Positive for benzodiazepines Positive for amphetamines   Imaging I have reviewed the images obtained: CT head: No acute changes MRI of the cervical, thoracic and lumbar spine with  and without contrast shows a possible subtle longitudinally extensive abnormal cervical cord T2 hyperintensity without enhancement with broad differentials.  No abnormal flow voids to suggest AV fistula.  Assessment: 30 year old man with above past medical history including that of N2O abuse in the form of sniffing from whippets, presents for about a week maybe even longer generalized weakness and 1 week worth of gait abnormality and clumsiness in the upper and lower extremities with left foot weakness and right arm weakness. His examination is not straight forward for subacute combined degeneration of the cord, it is is somewhat perplexing but he does have some evidence of localization to the posterior column. Imaging shows long cervical hyperintensity without abnormal enhancement with a picture that could be similar to subacute combined degeneration of the cord. Preliminary CSF findings: Protein 44, with 2 white cells is not very impressive for infectious or inflammatory process. 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  Preliminary results with normal protein and cell count.  Glucose pending.  Gram stain and cultures pending.   Oligoclonal bands and IgG index pending-Will need outpatient follow-up. (Ordered) Check neuromyelitis optica antibodies and anti-MO G antibodies (ordered) Check vitamin B12 and MMA levels (ordered)-start B12 replenishment after drawing levels. I would recommend vitamin B12-1000 mcg intramuscular weekly for 1 month followed by 1000 mcg once a month intramuscular while also supplementing orally-to be done by EDP once lab is drawn. I would also check copper levels and ceruloplasmin levels-ordered Brain MRI with and without contrast (ordered)-just had spinal imaging done with contrast and will have to wait at least 12 hours from the last contrast injection-if suspicion for demyelinating disease in the brain, might need treatment with high-dose IV steroids Do not see any indication for IVIG at  this time. PT, OT assessments for any therapy needs Refraining from illicit drug use  Outpatient neurology follow-up closely within a couple of weeks of discharge.  I would recommend admission for observation until all preliminary results come back and parenteral B12 replenishment has been started as well as obtaining brain imaging.  During my interaction with the patient, he expressed multiple times that he would not like to stay but I would recommend that he at least stay over the next few hours to get all of the above done and to have a better handle on the diagnosis.  Plan discussed with Patrica Duel, PA-C n the ER  -- Milon Dikes, MD Neurologist [facility] Pager: [phone]   ADDENDUM Patient left AMA. Will follow with his PCP and neurologist  -- Milon Dikes, MD Neurologist [facility] Pager: [phone]

## 2021-01-15 ENCOUNTER — Inpatient Hospital Stay (HOSPITAL_COMMUNITY)
Admission: EM | Admit: 2021-01-15 | Discharge: 2021-01-17 | DRG: 074 | Discharge status: Against Medical Advice | Payer: BLUE CROSS/BLUE SHIELD | Attending: Family Medicine | Admitting: Family Medicine

## 2021-01-15 ENCOUNTER — Encounter (HOSPITAL_COMMUNITY): Payer: Self-pay

## 2021-01-15 ENCOUNTER — Other Ambulatory Visit: Payer: Self-pay

## 2021-01-15 DIAGNOSIS — E538 Deficiency of other specified B group vitamins: Secondary | ICD-10-CM | POA: Diagnosis present

## 2021-01-15 DIAGNOSIS — Z888 Allergy status to other drugs, medicaments and biological substances status: Secondary | ICD-10-CM

## 2021-01-15 DIAGNOSIS — Z87891 Personal history of nicotine dependence: Secondary | ICD-10-CM | POA: Diagnosis not present

## 2021-01-15 DIAGNOSIS — F1910 Other psychoactive substance abuse, uncomplicated: Secondary | ICD-10-CM | POA: Diagnosis not present

## 2021-01-15 DIAGNOSIS — G320 Subacute combined degeneration of spinal cord in diseases classified elsewhere: Secondary | ICD-10-CM | POA: Diagnosis present

## 2021-01-15 DIAGNOSIS — R203 Hyperesthesia: Secondary | ICD-10-CM | POA: Diagnosis present

## 2021-01-15 DIAGNOSIS — R292 Abnormal reflex: Secondary | ICD-10-CM | POA: Diagnosis present

## 2021-01-15 DIAGNOSIS — F909 Attention-deficit hyperactivity disorder, unspecified type: Secondary | ICD-10-CM | POA: Diagnosis present

## 2021-01-15 DIAGNOSIS — F4310 Post-traumatic stress disorder, unspecified: Secondary | ICD-10-CM | POA: Diagnosis present

## 2021-01-15 DIAGNOSIS — Z20822 Contact with and (suspected) exposure to covid-19: Secondary | ICD-10-CM | POA: Diagnosis present

## 2021-01-15 DIAGNOSIS — M5412 Radiculopathy, cervical region: Secondary | ICD-10-CM | POA: Diagnosis present

## 2021-01-15 DIAGNOSIS — R262 Difficulty in walking, not elsewhere classified: Secondary | ICD-10-CM | POA: Diagnosis present

## 2021-01-15 DIAGNOSIS — F17200 Nicotine dependence, unspecified, uncomplicated: Secondary | ICD-10-CM | POA: Diagnosis present

## 2021-01-15 DIAGNOSIS — Z9151 Personal history of suicidal behavior: Secondary | ICD-10-CM | POA: Diagnosis not present

## 2021-01-15 DIAGNOSIS — F319 Bipolar disorder, unspecified: Secondary | ICD-10-CM | POA: Diagnosis present

## 2021-01-15 DIAGNOSIS — Z79899 Other long term (current) drug therapy: Secondary | ICD-10-CM | POA: Diagnosis not present

## 2021-01-15 DIAGNOSIS — B182 Chronic viral hepatitis C: Secondary | ICD-10-CM | POA: Diagnosis present

## 2021-01-15 DIAGNOSIS — F32A Depression, unspecified: Secondary | ICD-10-CM | POA: Diagnosis present

## 2021-01-15 DIAGNOSIS — J45990 Exercise induced bronchospasm: Secondary | ICD-10-CM | POA: Diagnosis present

## 2021-01-15 LAB — COPPER, SERUM: Copper: 97 ug/dL (ref 63–121)

## 2021-01-15 LAB — CBC WITH DIFFERENTIAL/PLATELET
Abs Immature Granulocytes: 0.01 10*3/uL (ref 0.00–0.07)
Basophils Absolute: 0 10*3/uL (ref 0.0–0.1)
Basophils Relative: 0 %
Eosinophils Absolute: 0.1 10*3/uL (ref 0.0–0.5)
Eosinophils Relative: 2 %
HCT: 39.1 % (ref 39.0–52.0)
Hemoglobin: 14 g/dL (ref 13.0–17.0)
Immature Granulocytes: 0 %
Lymphocytes Relative: 35 %
Lymphs Abs: 2 10*3/uL (ref 0.7–4.0)
MCH: 35.5 pg — ABNORMAL HIGH (ref 26.0–34.0)
MCHC: 35.8 g/dL (ref 30.0–36.0)
MCV: 99.2 fL (ref 80.0–100.0)
Monocytes Absolute: 0.5 10*3/uL (ref 0.1–1.0)
Monocytes Relative: 8 %
Neutro Abs: 3.1 10*3/uL (ref 1.7–7.7)
Neutrophils Relative %: 55 %
Platelets: 268 10*3/uL (ref 150–400)
RBC: 3.94 MIL/uL — ABNORMAL LOW (ref 4.22–5.81)
RDW: 13.5 % (ref 11.5–15.5)
WBC: 5.7 10*3/uL (ref 4.0–10.5)
nRBC: 0 % (ref 0.0–0.2)

## 2021-01-15 LAB — BASIC METABOLIC PANEL
Anion gap: 9 (ref 5–15)
BUN: 15 mg/dL (ref 6–20)
CO2: 24 mmol/L (ref 22–32)
Calcium: 9.2 mg/dL (ref 8.9–10.3)
Chloride: 104 mmol/L (ref 98–111)
Creatinine, Ser: 0.96 mg/dL (ref 0.61–1.24)
GFR, Estimated: >60 mL/min (ref >60)
Glucose, Bld: 86 mg/dL (ref 70–99)
Potassium: 3.8 mmol/L (ref 3.5–5.1)
Sodium: 137 mmol/L (ref 135–145)

## 2021-01-15 LAB — VARICELLA-ZOSTER BY PCR

## 2021-01-15 LAB — VITAMIN B12: Vitamin B-12: 671 pg/mL (ref 180–914)

## 2021-01-15 LAB — CERULOPLASMIN: Ceruloplasmin: 22.9 mg/dL (ref 16.0–31.0)

## 2021-01-15 MED ORDER — ACETAMINOPHEN 325 MG PO TABS: 650.0000 mg | ORAL_TABLET | Freq: Four times a day (QID) | ORAL | Status: DC | PRN

## 2021-01-15 MED ORDER — ONDANSETRON HCL 4 MG PO TABS: 4.0000 mg | ORAL_TABLET | Freq: Four times a day (QID) | ORAL | Status: DC | PRN

## 2021-01-15 MED ORDER — ESCITALOPRAM OXALATE 10 MG PO TABS
10.0000 mg | ORAL_TABLET | Freq: Every day | ORAL | Status: DC
  Administered 2021-01-16 – 2021-01-17 (×2): 10 mg via ORAL
  Filled 2021-01-15 (×2): qty 1

## 2021-01-15 MED ORDER — MORPHINE SULFATE (PF) 2 MG/ML IV SOLN
2.0000 mg | INTRAVENOUS | Status: DC | PRN
  Filled 2021-01-15: qty 1

## 2021-01-15 MED ORDER — ACETAMINOPHEN 650 MG RE SUPP: 650.0000 mg | Freq: Four times a day (QID) | RECTAL | Status: DC | PRN

## 2021-01-15 MED ORDER — LAMOTRIGINE 100 MG PO TABS
200.0000 mg | ORAL_TABLET | Freq: Every day | ORAL | Status: DC
  Administered 2021-01-16 – 2021-01-17 (×2): 200 mg via ORAL
  Filled 2021-01-15 (×2): qty 2

## 2021-01-15 MED ORDER — AMPHETAMINE-DEXTROAMPHETAMINE 5 MG PO TABS: 15.0000 mg | ORAL_TABLET | Freq: Every day | ORAL | Status: DC | PRN

## 2021-01-15 MED ORDER — AMPHETAMINE-DEXTROAMPHET ER 5 MG PO CP24
30.0000 mg | ORAL_CAPSULE | Freq: Every day | ORAL | Status: DC
  Administered 2021-01-16 – 2021-01-17 (×2): 30 mg via ORAL
  Filled 2021-01-15 (×2): qty 6

## 2021-01-15 MED ORDER — LORAZEPAM 1 MG PO TABS
2.0000 mg | ORAL_TABLET | Freq: Every day | ORAL | Status: DC | PRN
  Administered 2021-01-16: 2 mg via ORAL
  Filled 2021-01-15: qty 2

## 2021-01-15 MED ORDER — LORAZEPAM 2 MG/ML IJ SOLN
1.0000 mg | Freq: Once | INTRAMUSCULAR | Status: AC | PRN
  Administered 2021-01-16: 1 mg via INTRAVENOUS
  Filled 2021-01-15: qty 1

## 2021-01-15 MED ORDER — ONDANSETRON HCL 4 MG/2ML IJ SOLN: 4.0000 mg | Freq: Four times a day (QID) | INTRAMUSCULAR | Status: DC | PRN

## 2021-01-15 MED ORDER — CARIPRAZINE HCL 1.5 MG PO CAPS
1.5000 mg | ORAL_CAPSULE | Freq: Every day | ORAL | Status: DC
  Administered 2021-01-16 – 2021-01-17 (×2): 1.5 mg via ORAL
  Filled 2021-01-15 (×2): qty 1

## 2021-01-15 MED ORDER — ALBUTEROL SULFATE HFA 108 (90 BASE) MCG/ACT IN AERS
1.0000 | INHALATION_SPRAY | Freq: Four times a day (QID) | RESPIRATORY_TRACT | Status: DC | PRN
  Filled 2021-01-15: qty 6.7

## 2021-01-15 NOTE — Progress Notes (Signed)
Received pt from [facility] ED via personal vehicle, alert and oriented, girlfriend at bedside, admissions paged, neuro on call paged regarding pt's arrival, MD orders implemented., call light in reach and personal items in reach

## 2021-01-15 NOTE — ED Notes (Signed)
Patient is going to [facility]. Nurse Raymon took report. At 1950. Patient is going POV. Transport provided by girlfriend. IV removed before transport. MD Mikeal Hawthorne gave permission for patient to go POV at 1934. Patient states he is going to the hospital he wants to be admitted. A&0 x4 patient is ambulatory.

## 2021-01-15 NOTE — ED Provider Notes (Signed)
[facility]-EMERGENCY DEPT Provider Note   CSN: [id number] Arrival date & time: 01/15/21  1446     History Chief Complaint  Patient presents with   Numbness    Ruben [name] is a 30 y.o. male with past medical history for ADHD, anxiety and depression, and polysubstance abuse who return to the ER for numbness and progressive weakness.  He was just seen in the ED a couple of days ago, including by myself personally, for symptoms.  He ultimately left AMA before MRI brain.  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  He admits to performing whippets.  This was likely cause for his clumsiness and gait disturbance.  Copper and ceruloplasmin levels ordered and pending.  Suspected inactivation of vitamin B12 due to oxidation of cobalt in cobalamin.  Lumbar puncture obtained was unimpressive for infectious or inflammatory process.  Plan for checking B12 and MMA levels.  Recommending 1000 mcg intramuscular vitamin B12 x1 month.  Neurology also recommended brain MRI with and without contrast and if there is suspicion for demyelinating disease in the brain he may require high-dose IV steroids.  It was recommended that he be admitted for observation until preliminary results came back and parenteral B12 replacement started.  On my examination, patient is again accompanied by his girlfriend was at bedside.  He denies any changes in the past 24 to 48 hours.  His symptoms are unimproved, but not worsening.  He continues to endorse difficulty with gait and clumsiness/weakness involving all extremities.  He understands this is likely secondary to his whippets and that recommendation was for hospitalist admission for parenteral  B12 and MRI brain.  He is agreeable to admission.  Denies any new incontinence, fevers or chills, pain, blurred vision, dizziness, or other symptoms.  HPI     Past Medical History:  Diagnosis Date   ADHD (attention deficit hyperactivity disorder)    Anxiety    Depression    Suicide attempt [facility])     Patient Active Problem List   Diagnosis Date Noted   Cervical radiculopathy 01/15/2021   Accidental overdose 06/07/2013   Suicidal ideation 04/11/2013   Overdose 04/11/2013   Depressive disorder 04/11/2013    Past Surgical History:  Procedure Laterality Date   DENTAL SURGERY         Family History  Problem Relation Age of Onset   Hypertension Father    Cancer Paternal Grandfather     Social History   Tobacco Use   Smoking status: Former    Packs/day: 1.00    Pack years: 0.00    Types: Cigarettes   Smokeless tobacco: Never  Vaping Use   Vaping Use: Every day   Substances: Nicotine, Flavoring  Substance Use Topics   Alcohol use: Yes   Drug use: Yes    Types: Marijuana    Home Medications Prior to Admission medications   Medication Sig Start Date End Date Taking? Authorizing Provider  amphetamine-dextroamphetamine (ADDERALL XR) 30 MG 24 hr capsule Take 30 mg by mouth daily. 12/22/20   [provider]  amphetamine-dextroamphetamine (ADDERALL) 15 MG tablet Take 15 mg by mouth daily. 01/08/21   [provider]  escitalopram (LEXAPRO) 10 MG tablet Take 10 mg by mouth daily. 01/08/21   [provider]  ibuprofen (  ADVIL,MOTRIN) 200 MG tablet Take 400-800 mg by mouth every 6 (six) hours as needed for moderate pain.    [provider]  lamoTRIgine (LAMICTAL) 200 MG tablet Take 200 mg by mouth daily. 01/11/21   [provider]  LORazepam (ATIVAN) 2 MG tablet Take 0.5-4 mg by mouth daily as needed for anxiety. 01/08/21   [provider]  polyvinyl alcohol (LIQUIFILM TEARS) 1.4 % ophthalmic solution Place 1 drop into both eyes  as needed for dry eyes.    [provider]  venlafaxine XR (EFFEXOR-XR) 150 MG 24 hr capsule Take 1 capsule (150 mg total) by mouth daily with breakfast. Patient not taking: Reported on 01/13/2021 12/27/13   Valarie Cones, Dema Severin, PA-C  VRAYLAR 1.5 MG capsule Take 1.5 mg by mouth daily. 01/08/21   [provider]    Allergies    Abilify [aripiprazole]  Review of Systems   Review of Systems  All other systems reviewed and are negative.  Physical Exam Updated Vital Signs BP (!) 127/93   Pulse 67   Temp 98.4 F (36.9 C) (Oral)   Resp 18   Ht 5\' 7"  (1.702 m)   Wt 71.7 kg   SpO2 100%   BMI 24.75 kg/m   Physical Exam Vitals and nursing note reviewed. Exam conducted with a chaperone present.  Constitutional:      Appearance: Normal appearance.  HENT:     Head: Normocephalic and atraumatic.  Eyes:     General: No scleral icterus.    Conjunctiva/sclera: Conjunctivae normal.  Pulmonary:     Effort: Pulmonary effort is normal.  Skin:    General: Skin is dry.  Neurological:     Mental Status: He is alert and oriented to person, place, and time.     GCS: GCS eye subscore is 4. GCS verbal subscore is 5. GCS motor subscore is 6.     Comments: Alert and oriented.  Hyperesthesia in the lower extremities.  Unstable gait.  Negative Romberg and cerebellar exams.  Strength largely intact throughout.  Psychiatric:        Mood and Affect: Mood normal.        Behavior: Behavior normal.        Thought Content: Thought content normal.    ED Results / Procedures / Treatments   Labs (all labs ordered are listed, but only abnormal results are displayed) Labs Reviewed  CBC WITH DIFFERENTIAL/PLATELET - Abnormal; Notable for the following components:      Result Value   RBC 3.94 (*)    MCH 35.5 (*)    All other components within normal limits  SARS CORONAVIRUS 2 (TAT 6-24 HRS)  BASIC METABOLIC PANEL  VITAMIN B12  HOMOCYSTEINE    EKG None  Radiology CT Head Wo  Contrast  Result Date: 01/13/2021 CLINICAL DATA:  30 year old male with ataxia. EXAM: CT HEAD WITHOUT CONTRAST TECHNIQUE: Contiguous axial images were obtained from the base of the skull through the vertex without intravenous contrast. COMPARISON:  Head CT dated 08/27/2009. FINDINGS: Brain: No evidence of acute infarction, hemorrhage, hydrocephalus, extra-axial collection or mass lesion/mass effect. Vascular: No hyperdense vessel or unexpected calcification. Skull: Normal. Negative for fracture or focal lesion. Sinuses/Orbits: No acute finding. Other: None IMPRESSION: Normal noncontrast CT of the brain. Electronically Signed   By: 08/29/2009 M.D.   On: 01/13/2021 22:42   MR Cervical Spine W or Wo Contrast  Result Date: 01/13/2021 CLINICAL DATA:  Motor neuron disease; worsening weakness and numbness to all extremities  with falls EXAM: MRI CERVICAL, THORACIC AND LUMBAR SPINE WITHOUT AND WITH CONTRAST TECHNIQUE: Multiplanar and multiecho pulse sequences of the cervical spine, to include the craniocervical junction and cervicothoracic junction, and thoracic and lumbar spine, were obtained without and with intravenous contrast. CONTRAST:  6.625mL GADAVIST GADOBUTROL 1 MMOL/ML IV SOLN COMPARISON:  None. FINDINGS: MRI CERVICAL SPINE Alignment: No significant listhesis. Vertebrae: Vertebral body heights are maintained. There is no marrow edema. No suspicious osseous lesion. Cord: Possible subtle longitudinally extensive abnormal cord T2 hyperintensity spanning cervical levels and confined to the gray matter. No abnormal intrathecal enhancement. Posterior Fossa, vertebral arteries, paraspinal tissues: Unremarkable. Disc levels: Intervertebral disc heights and signal are maintained. No canal or foraminal stenosis at any level. MRI THORACIC SPINE Alignment:  Preserved. Vertebrae: Vertebral body heights are maintained. There is no marrow edema. No suspicious osseous lesion. Cord:  No abnormal intrathecal enhancement.  Paraspinal and other soft tissues: Unremarkable. Disc levels: Intervertebral disc heights and signal are maintained. Trace central disc protrusions, for example at T4-T5. There is no canal or foraminal stenosis at any level. MRI LUMBAR SPINE Segmentation:  Standard. Alignment:  Preserved. Vertebrae: Vertebral body heights are maintained. There is no marrow edema. No suspicious osseous lesion. Conus medullaris and cauda equina: Conus extends to the L1 level. Conus and cauda equina appear normal. No abnormal intrathecal enhancement. Paraspinal and other soft tissues: Unremarkable. Disc levels: Intervertebral disc heights and signal are maintained. There is no canal or foraminal stenosis at any level. IMPRESSION: Possible subtle longitudinally extensive abnormal cervical cord T2 hyperintensity without enhancement. Assuming this represents a true finding rather than artifact, differential is broad and includes infectious/post infectious and autoimmune etiologies. There are no abnormal flow voids to suggest arteriovenous fistula. Electronically Signed   By: Guadlupe SpanishPraneil  Patel M.D.   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 CONTRAST:  6.335mL GADAVIST GADOBUTROL 1 MMOL/ML IV SOLN COMPARISON:  None. FINDINGS: MRI CERVICAL SPINE Alignment: No significant listhesis. Vertebrae: Vertebral body heights are maintained. There is no marrow edema. No suspicious osseous lesion. Cord: Possible subtle longitudinally extensive abnormal cord T2 hyperintensity spanning cervical levels and confined to the  gray matter. No abnormal intrathecal enhancement. Posterior Fossa, vertebral arteries, paraspinal tissues: Unremarkable. Disc levels: Intervertebral disc heights and signal are maintained. No canal or foraminal stenosis at any level. MRI THORACIC SPINE Alignment:  Preserved. Vertebrae: Vertebral body heights are maintained. There is no marrow edema. No suspicious osseous lesion. Cord:  No abnormal intrathecal enhancement. Paraspinal and other soft tissues: Unremarkable. Disc levels: Intervertebral disc heights and signal are maintained. Trace central disc protrusions, for example at T4-T5. There is no canal or foraminal stenosis at any level. MRI LUMBAR SPINE Segmentation:  Standard. Alignment:  Preserved. Vertebrae: Vertebral body heights are maintained. There is no marrow edema. No suspicious osseous lesion. Conus medullaris and cauda equina: Conus extends to the L1 level. Conus and cauda equina appear normal. No abnormal intrathecal enhancement. Paraspinal and other soft tissues: Unremarkable. Disc levels: Intervertebral disc heights and signal are maintained. There is no canal or foraminal stenosis at any level. IMPRESSION: Possible subtle longitudinally extensive abnormal cervical cord T2 hyperintensity  without enhancement. Assuming this represents a true finding rather than artifact, differential is broad and includes infectious/post infectious and autoimmune etiologies. There are no abnormal flow voids to suggest arteriovenous fistula. Electronically Signed   By: Guadlupe Spanish M.D.   On: 01/13/2021 20:55   MR Lumbar Spine W Wo Contrast  Result Date: 01/13/2021 CLINICAL DATA:  Motor neuron disease; worsening weakness and numbness to all extremities with falls EXAM: MRI CERVICAL, THORACIC AND LUMBAR SPINE WITHOUT AND WITH CONTRAST TECHNIQUE: Multiplanar and multiecho pulse sequences of the cervical spine, to include the craniocervical junction and cervicothoracic junction, and thoracic and lumbar spine,  were obtained without and with intravenous contrast. CONTRAST:  6.62mL GADAVIST GADOBUTROL 1 MMOL/ML IV SOLN COMPARISON:  None. FINDINGS: MRI CERVICAL SPINE Alignment: No significant listhesis. Vertebrae: Vertebral body heights are maintained. There is no marrow edema. No suspicious osseous lesion. Cord: Possible subtle longitudinally extensive abnormal cord T2 hyperintensity spanning cervical levels and confined to the gray matter. No abnormal intrathecal enhancement. Posterior Fossa, vertebral arteries, paraspinal tissues: Unremarkable. Disc levels: Intervertebral disc heights and signal are maintained. No canal or foraminal stenosis at any level. MRI THORACIC SPINE Alignment:  Preserved. Vertebrae: Vertebral body heights are maintained. There is no marrow edema. No suspicious osseous lesion. Cord:  No abnormal intrathecal enhancement. Paraspinal and other soft tissues: Unremarkable. Disc levels: Intervertebral disc heights and signal are maintained. Trace central disc protrusions, for example at T4-T5. There is no canal or foraminal stenosis at any level. MRI LUMBAR SPINE Segmentation:  Standard. Alignment:  Preserved. Vertebrae: Vertebral body heights are maintained. There is no marrow edema. No suspicious osseous lesion. Conus medullaris and cauda equina: Conus extends to the L1 level. Conus and cauda equina appear normal. No abnormal intrathecal enhancement. Paraspinal and other soft tissues: Unremarkable. Disc levels: Intervertebral disc heights and signal are maintained. There is no canal or foraminal stenosis at any level. IMPRESSION: Possible subtle longitudinally extensive abnormal cervical cord T2 hyperintensity without enhancement. Assuming this represents a true finding rather than artifact, differential is broad and includes infectious/post infectious and autoimmune etiologies. There are no abnormal flow voids to suggest arteriovenous fistula. Electronically Signed   By: Guadlupe Spanish M.D.   On:  01/13/2021 20:55    Procedures Procedures   Medications Ordered in ED Medications - No data to display  ED Course  I have reviewed the triage vital signs and the nursing notes.  Pertinent labs & imaging results that were available during my care of the patient were reviewed by me and considered in my medical decision making (see chart for details).  Clinical Course as of 01/15/21 1842  Thu Jan 15, 2021  1740 Spoke with Dr. Otelia Limes, neurology.  He recommended that we obtain homocystine and vitamin B12 levels and then admit to hospitalist services at [facility].  He will see patient once admitted at [facility]. [GG]  [id number] I spoke with Dr. Mikeal Hawthorne who will admit patient. [GG]    Clinical Course User Index [GG] Lorelee New, PA-C   MDM Rules/Calculators/A&P                          Deaken Jurgens Kirkendall was evaluated in Emergency Department on 01/15/2021 for the symptoms described in the history of present illness. He was evaluated in the context of the global COVID-19 pandemic, which necessitated consideration that the patient might be at risk for infection with the SARS-CoV-2 virus that causes COVID-19. Institutional protocols and algorithms  that pertain to the evaluation of patients at risk for COVID-19 are in a state of rapid change based on information released by regulatory bodies including the CDC and federal and state organizations. These policies and algorithms were followed during the patient's care in the ED.  I personally reviewed patient's medical chart and all notes from triage and staff during today's encounter. I have also ordered and reviewed all labs and imaging that I felt to be medically necessary in the evaluation of this patient's complaints and with consideration of their physical exam. If needed, translation services were available and utilized.   Will consult neuro hospitalist and then admit to hospitalist services for likely parenteral B12 and brain MRI.  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  Regardless, will require further work-up and imaging.  Spoke with Dr. Otelia Limes, neurology.  He recommended that we obtain homocystine and vitamin B12 levels and then admit to hospitalist services at [facility].  He will see patient once admitted at [facility].      Final Clinical Impression(s) / ED Diagnoses Final diagnoses:  Subacute combined degeneration of spinal cord [facility])    Rx / DC Orders ED Discharge Orders     None        Lorelee New, PA-C 01/15/21 1842    Pricilla Loveless, MD 01/15/21 2021

## 2021-01-15 NOTE — ED Triage Notes (Signed)
Patient c/o "numbness all over." X 1 month. Patient states he was seen 2 days ago and left before he got an MRI.

## 2021-01-15 NOTE — H&P (Signed)
History and Physical   Ruben [name]:[id number] DOB: 1990-12-18 DOA: 01/15/2021  Referring MD/NP/PA: Dr. Bruce Donath  PCP: Mattie Marlin, DO   Outpatient Specialists: None  Patient coming from: Home  Chief Complaint: Tingling in the past arms and legs  HPI: Ruben [name] is a 30 y.o. male with medical history significant of ADHD, polysubstance abuse, depression with prior suicide attempt, bipolar disorder who was seen in the ER 2 days ago and left early yesterday after complaining of progressive weakness and numbness of his upper extremity as well as some lower extremity weakness as well.  During the visit patient had work-up done including MRI of the C-spine T-spine as well as LP.  Neurology was consulted and evaluated patient.  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  He is known to have used multiple substances and continues to use substances including cocaine that was present in his system yesterday.  Also sniffs and inhales whippets.  This contains nitric oxide which apparently can lead to vitamin B12 deficiency.  He had a sudden longitudinal extensive hyperintensity in the cervical spinal cord that gave rise to the suspicion.  Attempt to admit the patient yesterday for continued evaluation was done but he left AGAINST MEDICAL ADVICE.  Work-up done including his drug screen, vitamin B12 level, lumbar puncture which showed no evidence of infection, gram stain still pending, patient had oligoclonal bands and IgG index pending other antibodies ordered include neuromyelitis optica and the anti M0G antibodies.  Also MMA levels were ordered.  Patient was initiated on B12 replacement but before then he left AGAINST MEDICAL ADVICE.  Neurology has recommended MRI of the brain which was not done yesterday.  He is back again today and be readmitted..  ED Course: Vitals remained stable  today.  CBC and chemistry also remained stable.  Previous work-up from yesterday still persist.  Patient is back and agrees with continued admission which we will proceed to do today.  Review of Systems: As per HPI otherwise 10 point review of systems negative.    Past Medical History:  Diagnosis Date   ADHD (attention deficit hyperactivity disorder)    Anxiety    Depression    Suicide attempt [facility])     Past Surgical History:  Procedure Laterality Date   DENTAL SURGERY       reports that he has quit smoking. His smoking use included cigarettes. He smoked an average of 1.00 packs per day. He has never used smokeless tobacco. He reports current alcohol use. He reports current drug use. Drug: Marijuana.  Allergies  Allergen Reactions   Abilify [Aripiprazole] Anaphylaxis    Pt states that medication gave him involuntary muscle spasms    Family History  Problem Relation Age of Onset   Hypertension Father    Cancer Paternal Grandfather      Prior to Admission medications   Medication Sig Start Date End Date Taking? Authorizing Provider  albuterol (VENTOLIN HFA) 108 (90 Base) MCG/ACT inhaler Inhale 1 puff into the lungs every 6 (six) hours as needed for wheezing or shortness of breath.   Yes [provider]  amphetamine-dextroamphetamine (ADDERALL XR) 30 MG 24 hr capsule Take 30 mg by mouth daily. morning 12/22/20  Yes [provider]  amphetamine-dextroamphetamine (ADDERALL) 15 MG tablet Take 15 mg by mouth daily as needed (for agitation). In the afternoon 01/08/21  Yes [provider]  escitalopram (LEXAPRO) 10 MG tablet Take 10 mg by mouth daily. 01/08/21  Yes [provider]  lamoTRIgine (LAMICTAL) 200 MG tablet Take 200 mg by mouth daily. 01/11/21  Yes [provider]  LORazepam (ATIVAN) 2 MG tablet Take 2 mg by mouth daily as needed for anxiety. 01/08/21  Yes [provider]  polyvinyl alcohol (LIQUIFILM TEARS) 1.4 % ophthalmic  solution Place 1 drop into both eyes daily as needed for dry eyes.   Yes [provider]  VRAYLAR 1.5 MG capsule Take 1.5 mg by mouth daily. 01/08/21  Yes [provider]  venlafaxine XR (EFFEXOR-XR) 150 MG 24 hr capsule Take 1 capsule (150 mg total) by mouth daily with breakfast. Patient not taking: No sig reported 12/27/13   Morrell RiddleWeber, Sarah L, PA-C    Physical Exam: Vitals:   01/15/21 1545 01/15/21 1700 01/15/21 1730 01/15/21 1900  BP: 125/84 131/90 (!) 127/93 108/86  Pulse: 90 62 67 76  Resp: 19 18 18 18   Temp:      TempSrc:      SpO2: 98% 100% 100% 100%  Weight:      Height:          Constitutional: Anxious, acutely ill looking Vitals:   01/15/21 1545 01/15/21 1700 01/15/21 1730 01/15/21 1900  BP: 125/84 131/90 (!) 127/93 108/86  Pulse: 90 62 67 76  Resp: 19 18 18 18   Temp:      TempSrc:      SpO2: 98% 100% 100% 100%  Weight:      Height:       Eyes: PERRL, lids and conjunctivae normal ENMT: Mucous membranes are dry. Posterior pharynx clear of any exudate or lesions.Normal dentition.  Neck: normal, supple, no masses, no thyromegaly Respiratory: clear to auscultation bilaterally, no wheezing, no crackles. Normal respiratory effort. No accessory muscle use.  Cardiovascular: Regular rate and rhythm, no murmurs / rubs / gallops. No extremity edema. 2+ pedal pulses. No carotid bruits.  Abdomen: no tenderness, no masses palpated. No hepatosplenomegaly. Bowel sounds positive.  Musculoskeletal: no clubbing / cyanosis. No joint deformity upper and lower extremities. Good ROM, no contractures. Normal muscle tone.  Skin: no rashes, lesions, ulcers. No induration Neurologic: CN 2-12 grossly intact.  Mild weakness in the left lower extremity 4 out of 5, sensation is decreased on the left more than the right, hyperreflexia especially in the lower extremities  psychiatric: Normal judgment and insight. Alert and oriented x 3.  Anxious mood.     Labs on Admission: I have  personally reviewed following labs and imaging studies  CBC: Recent Labs  Lab 01/13/21 1203 01/15/21 1623  WBC 4.3 5.7  NEUTROABS 2.0 3.1  HGB 14.0 14.0  HCT 40.0 39.1  MCV 99.8 99.2  PLT 268 268   Basic Metabolic Panel: Recent Labs  Lab 01/13/21 1203 01/15/21 1623  NA 138 137  K 3.9 3.8  CL 103 104  CO2 27 24  GLUCOSE 91 86  BUN 11 15  CREATININE 0.88 0.96  CALCIUM 9.2 9.2  MG 2.2  --   PHOS 2.3*  --    GFR: Estimated Creatinine Clearance: 107.1 mL/min (by C-G formula based on SCr of 0.96 mg/dL). Liver Function Tests: Recent Labs  Lab 01/13/21 1203  AST 20  ALT 27  ALKPHOS 43  BILITOT 0.4  PROT 7.7  ALBUMIN 4.5   No results for input(s): LIPASE, AMYLASE in the last 168 hours. No results for input(s): AMMONIA in the last  168 hours. Coagulation Profile: No results for input(s): INR, PROTIME in the last 168 hours. Cardiac Enzymes: No results for input(s): CKTOTAL, CKMB, CKMBINDEX, TROPONINI in the last 168 hours. BNP (last 3 results) No results for input(s): PROBNP in the last 8760 hours. HbA1C: No results for input(s): HGBA1C in the last 72 hours. CBG: No results for input(s): GLUCAP in the last 168 hours. Lipid Profile: No results for input(s): CHOL, HDL, LDLCALC, TRIG, CHOLHDL, LDLDIRECT in the last 72 hours. Thyroid Function Tests: No results for input(s): TSH, T4TOTAL, FREET4, T3FREE, THYROIDAB in the last 72 hours. Anemia Panel: Recent Labs    01/15/21 1740  VITAMINB12 671   Urine analysis:    Component Value Date/Time   COLORURINE YELLOW 04/11/2013 0519   APPEARANCEUR CLEAR 04/11/2013 0519   LABSPEC 1.012 04/11/2013 0519   PHURINE 6.0 04/11/2013 0519   GLUCOSEU NEGATIVE 04/11/2013 0519   HGBUR NEGATIVE 04/11/2013 0519   BILIRUBINUR NEGATIVE 04/11/2013 0519   KETONESUR NEGATIVE 04/11/2013 0519   PROTEINUR NEGATIVE 04/11/2013 0519   UROBILINOGEN 0.2 04/11/2013 0519   NITRITE NEGATIVE 04/11/2013 0519   LEUKOCYTESUR NEGATIVE 04/11/2013  0519   Sepsis Labs: (procalcitonin:4,lacticidven:4) ) Recent Results (from the past 240 hour(s))  CSF culture w Gram Stain     Status: None (Preliminary result)   Collection Time: 01/13/21 11:40 PM   Specimen: CSF; Cerebrospinal Fluid  Result Value Ref Range Status   Specimen Description   Final    CSF Performed at [facility], [address], [place], [place] [address]    Special Requests   Final    TUBE 4 Performed at [facility], [address], [place], [place] [address]    Gram Stain   Final    NO WBC SEEN NO ORGANISMS SEEN RESULT CALLED TO, READ BACK BY AND VERIFIED WITH: RN h shepherd at 0040 01/14/21 [email or web address] a Performed at [facility], [address] [place], [place] [address]    Culture   Final    NO GROWTH 1 DAY Performed at [facility], [address] [place], [place] [address]    Report Status PENDING  Incomplete     Radiological Exams on Admission: CT Head Wo Contrast  Result Date: 01/13/2021 CLINICAL DATA:  30 year old male with ataxia. EXAM: CT HEAD WITHOUT CONTRAST TECHNIQUE: Contiguous axial images were obtained from the base of the skull through the vertex without intravenous contrast. COMPARISON:  Head CT dated 08/27/2009. FINDINGS: Brain: No evidence of acute infarction, hemorrhage, hydrocephalus, extra-axial collection or mass lesion/mass effect. Vascular: No hyperdense vessel or unexpected calcification. Skull: Normal. Negative for fracture or focal lesion. Sinuses/Orbits: No acute finding. Other: None IMPRESSION: Normal noncontrast CT of the brain. Electronically Signed   By: Elgie Collard M.D.   On: 01/13/2021 22:42   MR Cervical Spine W or Wo Contrast  Result Date: 01/13/2021 CLINICAL DATA:  Motor neuron disease; worsening weakness and numbness to all extremities with falls EXAM: MRI CERVICAL, THORACIC AND LUMBAR SPINE WITHOUT AND WITH CONTRAST TECHNIQUE: Multiplanar and multiecho pulse sequences  of the cervical spine, to include the craniocervical junction and cervicothoracic junction, and thoracic and lumbar spine, were obtained without and with intravenous contrast. CONTRAST:  6.73mL GADAVIST GADOBUTROL 1 MMOL/ML IV SOLN COMPARISON:  None. FINDINGS: MRI CERVICAL SPINE Alignment: No significant listhesis. Vertebrae: Vertebral body heights are maintained. There is no marrow edema. No suspicious osseous lesion. Cord: Possible subtle longitudinally extensive abnormal cord T2 hyperintensity spanning cervical levels and  confined to the gray matter. No abnormal intrathecal enhancement. Posterior Fossa, vertebral arteries, paraspinal tissues: Unremarkable. Disc levels: Intervertebral disc heights and signal are maintained. No canal or foraminal stenosis at any level. MRI THORACIC SPINE Alignment:  Preserved. Vertebrae: Vertebral body heights are maintained. There is no marrow edema. No suspicious osseous lesion. Cord:  No abnormal intrathecal enhancement. Paraspinal and other soft tissues: Unremarkable. Disc levels: Intervertebral disc heights and signal are maintained. Trace central disc protrusions, for example at T4-T5. There is no canal or foraminal stenosis at any level. MRI LUMBAR SPINE Segmentation:  Standard. Alignment:  Preserved. Vertebrae: Vertebral body heights are maintained. There is no marrow edema. No suspicious osseous lesion. Conus medullaris and cauda equina: Conus extends to the L1 level. Conus and cauda equina appear normal. No abnormal intrathecal enhancement. Paraspinal and other soft tissues: Unremarkable. Disc levels: Intervertebral disc heights and signal are maintained. There is no canal or foraminal stenosis at any level. IMPRESSION: Possible subtle longitudinally extensive abnormal cervical cord T2 hyperintensity without enhancement. Assuming this represents a true finding rather than artifact, differential is broad and includes infectious/post infectious and autoimmune etiologies.  There are no abnormal flow voids to suggest arteriovenous fistula. Electronically Signed   By: Guadlupe Spanish M.D.   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 CONTRAST:  6.58mL GADAVIST GADOBUTROL 1 MMOL/ML IV SOLN COMPARISON:  None. FINDINGS: MRI CERVICAL SPINE Alignment: No significant listhesis. Vertebrae: Vertebral body heights are maintained. There is no marrow edema. No suspicious osseous lesion. Cord: Possible subtle longitudinally extensive abnormal cord T2 hyperintensity spanning cervical levels and confined to the gray matter. No abnormal intrathecal enhancement. Posterior Fossa, vertebral arteries, paraspinal tissues: Unremarkable. Disc levels: Intervertebral disc heights and signal are maintained. No canal or foraminal stenosis at any level. MRI THORACIC SPINE Alignment:  Preserved. Vertebrae: Vertebral body heights are maintained. There is no marrow edema. No suspicious osseous lesion. Cord:  No abnormal intrathecal enhancement. Paraspinal and other soft tissues: Unremarkable. Disc levels: Intervertebral disc heights and signal are maintained. Trace central disc protrusions, for example at T4-T5. There is no canal or foraminal stenosis at any level. MRI LUMBAR SPINE Segmentation:  Standard. Alignment:  Preserved. Vertebrae: Vertebral body heights are maintained. There is no marrow edema. No suspicious osseous lesion. Conus medullaris and cauda equina: Conus extends to the L1 level. Conus and cauda equina appear normal. No abnormal intrathecal enhancement. Paraspinal and  other soft tissues: Unremarkable. Disc levels: Intervertebral disc heights and signal are maintained. There is no canal or foraminal stenosis at any level. IMPRESSION: Possible subtle longitudinally extensive abnormal cervical cord T2 hyperintensity without enhancement. Assuming this represents a true finding rather than artifact, differential is broad and includes infectious/post infectious and autoimmune etiologies. There are no abnormal flow voids to suggest arteriovenous fistula. Electronically Signed   By: Guadlupe Spanish M.D.   On: 01/13/2021 20:55   MR Lumbar Spine W Wo Contrast  Result Date: 01/13/2021 CLINICAL DATA:  Motor neuron disease; worsening weakness and numbness to all extremities with falls EXAM: MRI CERVICAL, THORACIC AND LUMBAR SPINE WITHOUT AND WITH CONTRAST TECHNIQUE: Multiplanar and multiecho pulse sequences of the  cervical spine, to include the craniocervical junction and cervicothoracic junction, and thoracic and lumbar spine, were obtained without and with intravenous contrast. CONTRAST:  6.77mL GADAVIST GADOBUTROL 1 MMOL/ML IV SOLN COMPARISON:  None. FINDINGS: MRI CERVICAL SPINE Alignment: No significant listhesis. Vertebrae: Vertebral body heights are maintained. There is no marrow edema. No suspicious osseous lesion. Cord: Possible subtle longitudinally extensive abnormal cord T2 hyperintensity spanning cervical levels and confined to the gray matter. No abnormal intrathecal enhancement. Posterior Fossa, vertebral arteries, paraspinal tissues: Unremarkable. Disc levels: Intervertebral disc heights and signal are maintained. No canal or foraminal stenosis at any level. MRI THORACIC SPINE Alignment:  Preserved. Vertebrae: Vertebral body heights are maintained. There is no marrow edema. No suspicious osseous lesion. Cord:  No abnormal intrathecal enhancement. Paraspinal and other soft tissues: Unremarkable. Disc levels: Intervertebral disc heights and signal are maintained. Trace central  disc protrusions, for example at T4-T5. There is no canal or foraminal stenosis at any level. MRI LUMBAR SPINE Segmentation:  Standard. Alignment:  Preserved. Vertebrae: Vertebral body heights are maintained. There is no marrow edema. No suspicious osseous lesion. Conus medullaris and cauda equina: Conus extends to the L1 level. Conus and cauda equina appear normal. No abnormal intrathecal enhancement. Paraspinal and other soft tissues: Unremarkable. Disc levels: Intervertebral disc heights and signal are maintained. There is no canal or foraminal stenosis at any level. IMPRESSION: Possible subtle longitudinally extensive abnormal cervical cord T2 hyperintensity without enhancement. Assuming this represents a true finding rather than artifact, differential is broad and includes infectious/post infectious and autoimmune etiologies. There are no abnormal flow voids to suggest arteriovenous fistula. Electronically Signed   By: Guadlupe Spanish M.D.   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  Care will be continued per neurology as of yesterday.  Continue with B12 replacement.  Also continue with MRI of the brain.  Physical therapy and Occupational Therapy.  Patient may be a candidate for IVIG if abnormalities are seen with MRI of the brain.  Continue per neurology.  #2 ADHD: Patient on Adderall at home.  He takes both long-acting and short acting.  We will continue with current treatment.  #3 PTSD: Patient also has medications including Lexapro, Vraylar and Ativan.  Continue home regimen  #4 depression with anxiety: Again continue with Lexapro  and Ativan.  #5 bipolar disorder: On Lamictal.  Continue  #6 exercise-induced asthma: Continue albuterol as needed   DVT prophylaxis: SCD Code Status: Full code Family Communication: Patient's girlfriend with him Disposition Plan: Home Consults called: Dr. Otelia Limes, neurology Admission status: Inpatient  Severity of Illness: The appropriate patient status for this patient is INPATIENT. Inpatient status is judged to be reasonable and necessary in order to provide the required intensity of service to ensure the patient's safety. The patient's presenting symptoms, physical exam findings, and initial radiographic and laboratory data in the context of their chronic comorbidities is felt to place them at high risk for further clinical deterioration. Furthermore, it is not anticipated that the patient will be medically stable for discharge from the hospital within 2 midnights of admission. The following factors support the patient status of inpatient.   "  The patient's presenting symptoms include tingling and weakness of the left upper extremity. " The worrisome physical exam findings include mild weakness and sensation changes in the left lower extremity. " The initial radiographic and laboratory data are worrisome because of abnormal MRI of the C-spine. " The chronic co-morbidities include polysubstance abuse.   * I certify that at the point of admission it is my clinical judgment that the patient will require inpatient hospital care spanning beyond 2 midnights from the point of admission due to high intensity of service, high risk for further deterioration and high frequency of surveillance required.Lonia Blood MD [facility] Pager [phone]  If 7PM-7AM, please contact night-coverage [email or web address] Password [email or web address]  01/15/2021, 7:52 PM

## 2021-01-16 ENCOUNTER — Inpatient Hospital Stay (HOSPITAL_COMMUNITY): Payer: BLUE CROSS/BLUE SHIELD

## 2021-01-16 DIAGNOSIS — F1910 Other psychoactive substance abuse, uncomplicated: Secondary | ICD-10-CM

## 2021-01-16 LAB — HSV 1/2 PCR, CSF
HSV-1 DNA: NEGATIVE
HSV-2 DNA: NEGATIVE

## 2021-01-16 LAB — IGG CSF INDEX
Albumin CSF-mCnc: 33 mg/dL (ref 10–45)
Albumin: 4.4 g/dL (ref 4.1–5.2)
CSF IgG Index: 0.5 (ref 0.0–0.7)
IgG (Immunoglobin G), Serum: 758 mg/dL (ref 603–1613)
IgG, CSF: 3.1 mg/dL (ref 0.0–10.3)
IgG/Alb Ratio, CSF: 0.09 (ref 0.00–0.25)

## 2021-01-16 LAB — CBC
HCT: 37.5 % — ABNORMAL LOW (ref 39.0–52.0)
Hemoglobin: 13.4 g/dL (ref 13.0–17.0)
MCH: 35 pg — ABNORMAL HIGH (ref 26.0–34.0)
MCHC: 35.7 g/dL (ref 30.0–36.0)
MCV: 97.9 fL (ref 80.0–100.0)
Platelets: 229 10*3/uL (ref 150–400)
RBC: 3.83 MIL/uL — ABNORMAL LOW (ref 4.22–5.81)
RDW: 13.5 % (ref 11.5–15.5)
WBC: 5.7 10*3/uL (ref 4.0–10.5)
nRBC: 0 % (ref 0.0–0.2)

## 2021-01-16 LAB — COMPREHENSIVE METABOLIC PANEL
ALT: 21 U/L (ref 0–44)
AST: 18 U/L (ref 15–41)
Albumin: 3.7 g/dL (ref 3.5–5.0)
Alkaline Phosphatase: 41 U/L (ref 38–126)
Anion gap: 7 (ref 5–15)
BUN: 13 mg/dL (ref 6–20)
CO2: 26 mmol/L (ref 22–32)
Calcium: 9.1 mg/dL (ref 8.9–10.3)
Chloride: 103 mmol/L (ref 98–111)
Creatinine, Ser: 0.93 mg/dL (ref 0.61–1.24)
GFR, Estimated: >60 mL/min (ref >60)
Glucose, Bld: 103 mg/dL — ABNORMAL HIGH (ref 70–99)
Potassium: 3.6 mmol/L (ref 3.5–5.1)
Sodium: 136 mmol/L (ref 135–145)
Total Bilirubin: 0.7 mg/dL (ref 0.3–1.2)
Total Protein: 5.9 g/dL — ABNORMAL LOW (ref 6.5–8.1)

## 2021-01-16 LAB — HIV ANTIBODY (ROUTINE TESTING W REFLEX): HIV Screen 4th Generation wRfx: NONREACTIVE

## 2021-01-16 LAB — SARS CORONAVIRUS 2 (TAT 6-24 HRS): SARS Coronavirus 2: NEGATIVE

## 2021-01-16 LAB — NEUROMYELITIS OPTICA AUTOAB, IGG: NMO-IgG: <1.5 U/mL (ref 0.0–3.0)

## 2021-01-16 IMAGING — MR MR HEAD WO/W CM
14 of 16 series · 40 of 48 positions shown · IV contrast (gadavist)
Comparison: None.

CLINICAL DATA: Tingling, numbness and weakness

EXAM:
MRI HEAD WITHOUT AND WITH CONTRAST
TECHNIQUE: Multiplanar, multiecho pulse sequences of the brain and surrounding
structures were obtained without and with intravenous contrast.
CONTRAST:  7mL GADAVIST GADOBUTROL 1 MMOL/ML IV SOLN

[Series 5: DWI · axial · 3.0mm · 0.88mm/px · z∈[-137,+6]mm · 6 of 104 slices shown (1 of 4)]
[im 1/104]
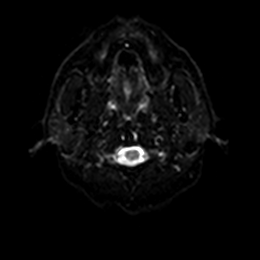
[im 21/104]
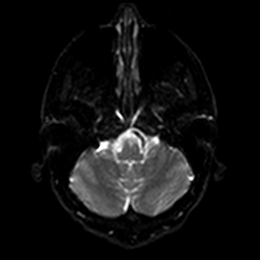
[im 42/104]
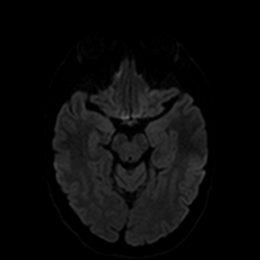
[im 62/104]
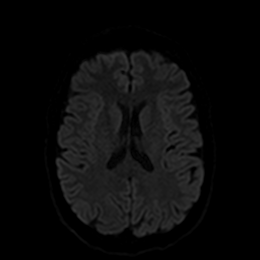
[im 83/104]
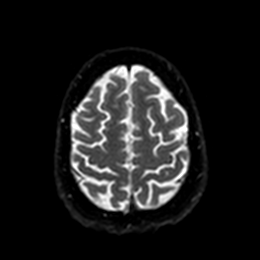
[im 104/104]
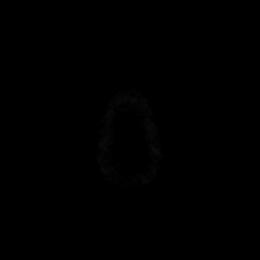

[Series 6: DWI · axial · 3.0mm · 0.88mm/px · z∈[-137,+6]mm · 2 of 51 slices shown (2 of 4)]
[im 1/51]
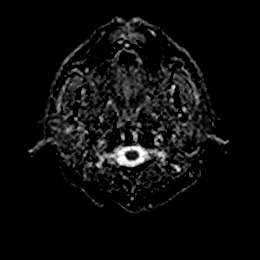
[im 51/51]
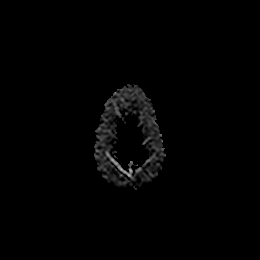

[Series 7: DWI · coronal · 4.0mm · 0.88mm/px · 4 of 76 slices shown (3 of 4)]
[im 1/76]
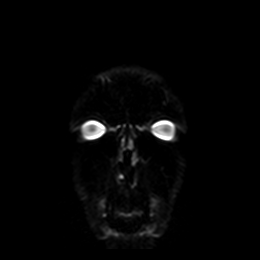
[im 26/76]
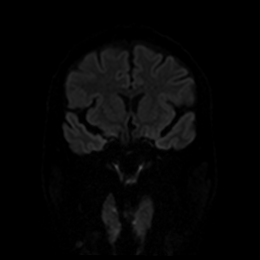
[im 51/76]
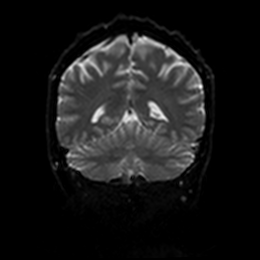
[im 76/76]
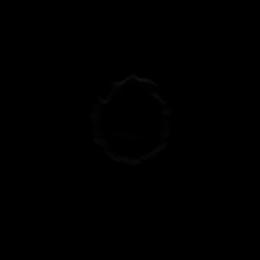

[Series 8: DWI · coronal · 4.0mm · 0.88mm/px · 2 of 38 slices shown (4 of 4)]
[im 1/38]
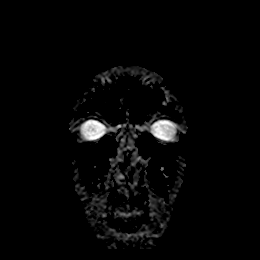
[im 38/38]
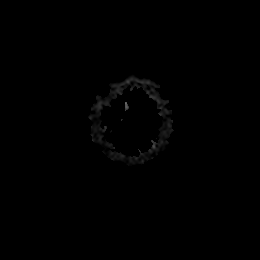

[Series 9: T1 · sagittal · 5.0mm · 0.75mm/px · 2 of 25 slices shown]
[im 1/25]
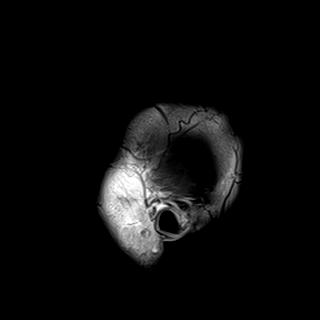
[im 25/25]
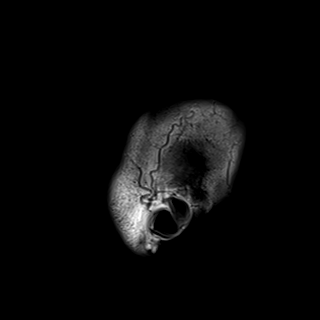

[Series 10: T2 · axial · 5.0mm · 0.72mm/px · z∈[-138,+8]mm · 2 of 27 slices shown]
[im 1/27]
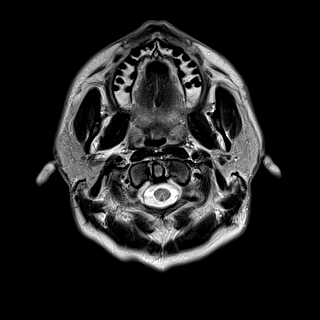
[im 27/27]
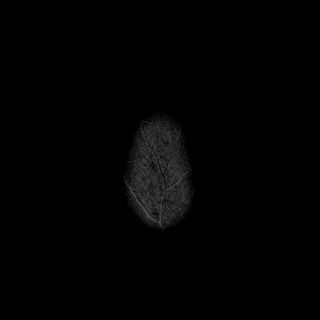

[Series 11: FLAIR · axial · 5.0mm · 0.45mm/px · z∈[-137,+9]mm · 2 of 27 slices shown]
[im 1/27]
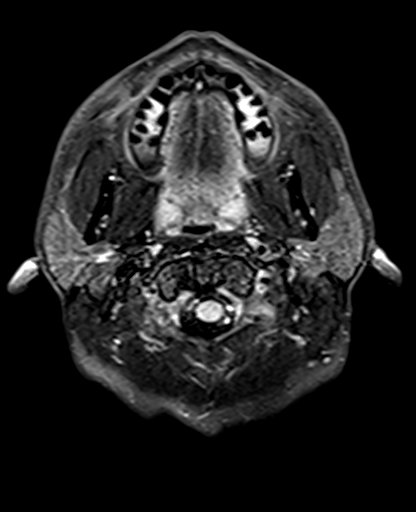
[im 27/27]
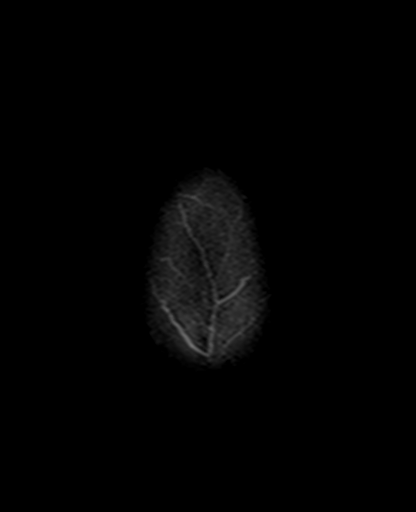

[Series 12: mag_images · axial · 3.0mm · 0.90mm/px · z∈[-141,+13]mm · 4 of 56 slices shown]
[im 1/56]
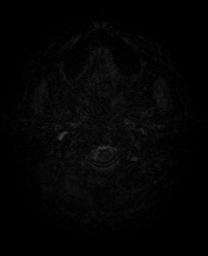
[im 19/56]
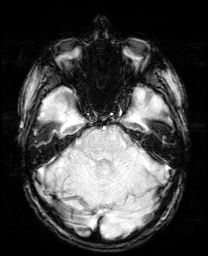
[im 37/56]
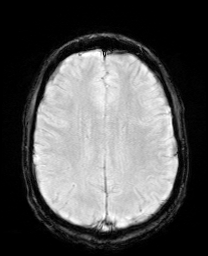
[im 56/56]
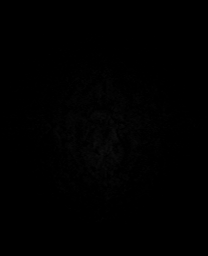

[Series 13: pha_images · axial · 3.0mm · 0.90mm/px · z∈[-141,+7]mm · 3 of 54 slices shown]
[im 1/54]
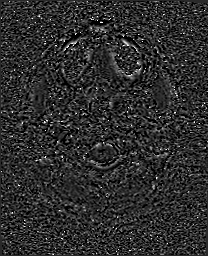
[im 27/54]
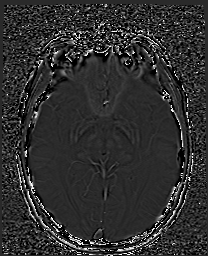
[im 54/54]
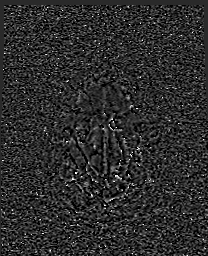

[Series 14: swi_images · axial · 3.0mm · 0.90mm/px · z∈[-141,+13]mm · 4 of 56 slices shown]
[im 1/56]
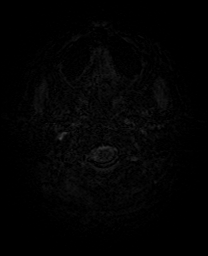
[im 19/56]
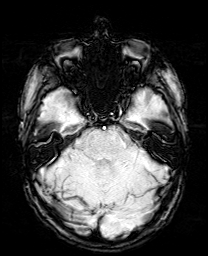
[im 37/56]
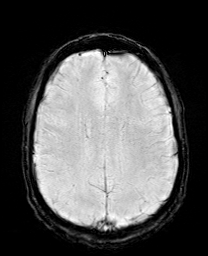
[im 56/56]
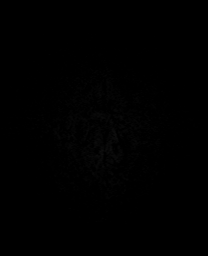

[Series 15: mip_images(sw) · axial · 24.0mm · 0.90mm/px · z∈[-131,+3]mm · 3 of 49 slices shown]
[im 1/49]
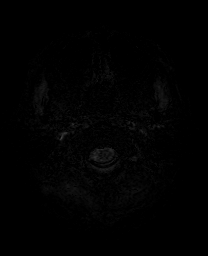
[im 25/49]
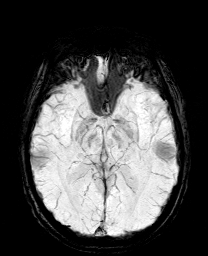
[im 49/49]
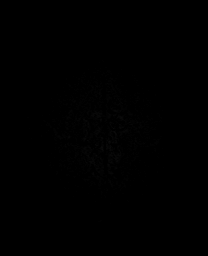

[Series 17: T2 post-contrast · coronal · 5.0mm · 0.72mm/px · 2 of 31 slices shown]
[im 1/31]
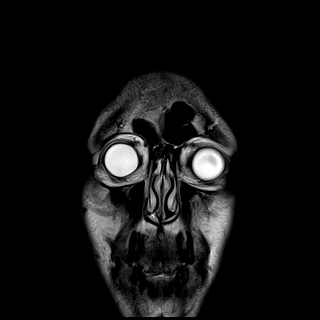
[im 31/31]
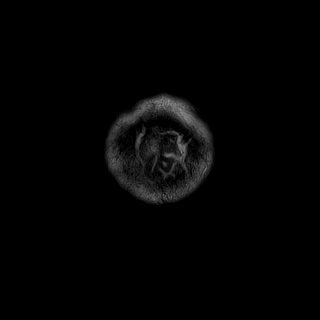

[Series 19: T1 post-contrast · coronal · 5.0mm · 0.34mm/px · 2 of 31 slices shown (1 of 2)]
[im 1/31]
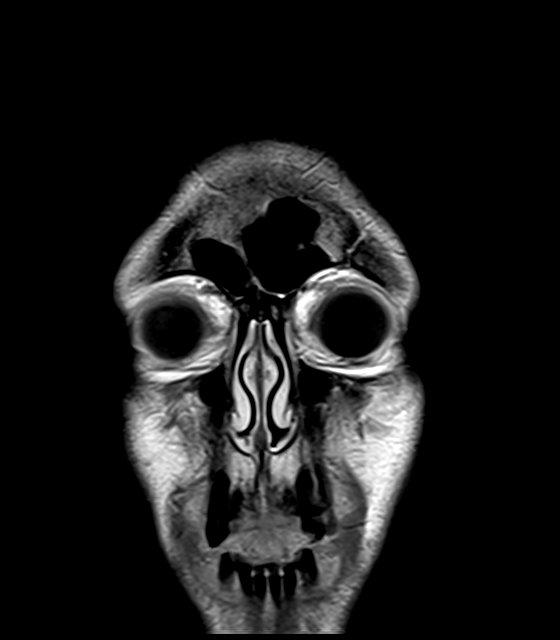
[im 31/31]
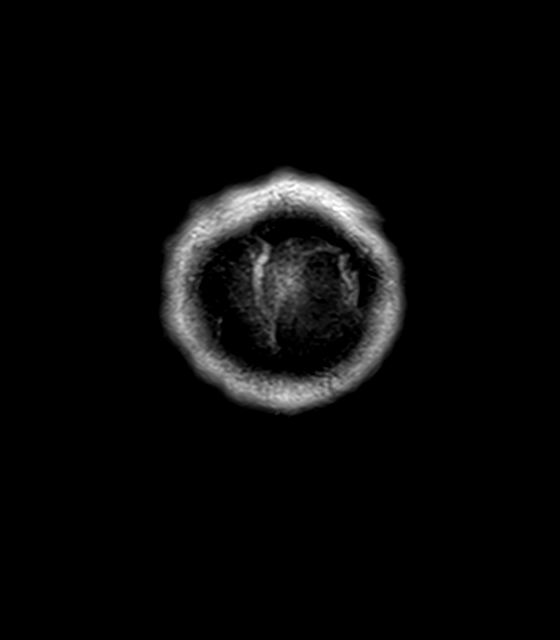

[Series 20: T1 post-contrast · sagittal · 5.0mm · 0.72mm/px · 2 of 25 slices shown (2 of 2)]
[im 1/25]
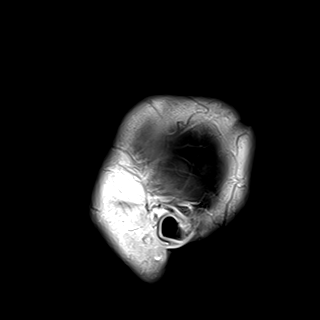
[im 25/25]
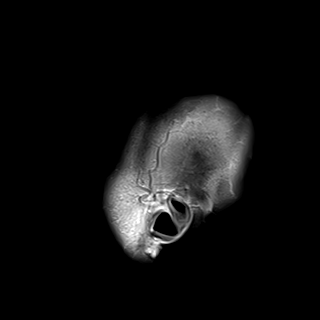

[40 of 48 positions shown; findings below may reference images not displayed]

FINDINGS: Brain: No acute infarct, mass effect or extra-axial collection. No
acute or chronic hemorrhage. Normal white matter signal, parenchymal
volume and CSF spaces. The midline structures are normal. There is
no abnormal contrast enhancement.

Vascular: Major flow voids are preserved.

Skull and upper cervical spine: Normal calvarium and skull base.
Visualized upper cervical spine and soft tissues are normal.

Sinuses/Orbits:No paranasal sinus fluid levels or advanced mucosal
thickening. No mastoid or middle ear effusion. Normal orbits.
IMPRESSION: Normal brain MRI.

## 2021-01-16 MED ORDER — VITAMIN B-12 1000 MCG PO TABS
2000.0000 ug | ORAL_TABLET | Freq: Every day | ORAL | Status: DC
  Administered 2021-01-16 – 2021-01-17 (×2): 2000 ug via ORAL
  Filled 2021-01-16 (×2): qty 2

## 2021-01-16 MED ORDER — CYANOCOBALAMIN 1000 MCG/ML IJ SOLN
1000.0000 ug | Freq: Every day | INTRAMUSCULAR | Status: DC
  Administered 2021-01-16 – 2021-01-17 (×2): 1000 ug via INTRAMUSCULAR
  Filled 2021-01-16 (×2): qty 1

## 2021-01-16 MED ORDER — GADOBUTROL 1 MMOL/ML IV SOLN
7.0000 mL | Freq: Once | INTRAVENOUS | Status: AC | PRN
  Administered 2021-01-16: 7 mL via INTRAVENOUS

## 2021-01-16 MED ORDER — LORAZEPAM 1 MG PO TABS
2.0000 mg | ORAL_TABLET | Freq: Two times a day (BID) | ORAL | Status: DC | PRN
  Administered 2021-01-16 – 2021-01-17 (×2): 2 mg via ORAL
  Filled 2021-01-16 (×2): qty 2

## 2021-01-16 NOTE — TOC Initial Note (Signed)
Transition of Care [facility]) - Initial/Assessment Note    Patient Details  Name: Ruben [name] MRN: [id number] Date of Birth: August 31, 1990  Transition of Care [facility] CM/SW Contact:    Kermit Balo, RN Phone Number: 01/16/2021, 3:41 PM  Clinical Narrative:                 Patient lives at home with his parents. He denies issues with home medications or transportation.  Pt with recommendation for outpatient OT. He is interested in attending [employer]. Orders in epic and information on the AVS.  Pt refused recommended 3 in 1.  TOC following.  Expected Discharge Plan: OP Rehab Barriers to Discharge: Continued Medical Work up   Patient Goals and CMS Choice     Choice offered to / list presented to : Patient  Expected Discharge Plan and Services Expected Discharge Plan: OP Rehab   Discharge Planning Services: CM Consult   Living arrangements for the past 2 months: Single Family Home                                      Prior Living Arrangements/Services Living arrangements for the past 2 months: Single Family Home Lives with:: Parents Patient language and need for interpreter reviewed:: Yes Do you feel safe going back to the place where you live?: Yes      Need for Family Participation in Patient Care: No (Comment)     Criminal Activity/Legal Involvement Pertinent to Current Situation/Hospitalization: No - Comment as needed  Activities of Daily Living Home Assistive Devices/Equipment: Eyeglasses ADL Screening (condition at time of admission) Patient's cognitive ability adequate to safely complete daily activities?: Yes Is the patient deaf or have difficulty hearing?: No Does the patient have difficulty seeing, even when wearing glasses/contacts?: No Does the patient have difficulty concentrating, remembering, or making decisions?: No Patient able to express need for assistance with ADLs?: Yes Does the patient have difficulty dressing or bathing?:  No Independently performs ADLs?: No Communication: Independent Dressing (OT): Independent Grooming: Independent Feeding: Independent Bathing: Independent Toileting: Needs assistance Is this a change from baseline?: Pre-admission baseline In/Out Bed: Needs assistance Is this a change from baseline?: Pre-admission baseline Walks in Home: Needs assistance Is this a change from baseline?: Pre-admission baseline Does the patient have difficulty walking or climbing stairs?: Yes Weakness of Legs: Both Weakness of Arms/Hands: Both  Permission Sought/Granted                  Emotional Assessment Appearance:: Appears stated age Attitude/Demeanor/Rapport: Engaged Affect (typically observed): Accepting Orientation: : Oriented to Self, Oriented to Place, Oriented to  Time, Oriented to Situation Alcohol / Substance Use: Illicit Drugs Psych Involvement: No (comment)  Admission diagnosis:  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, 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] Cervical radiculopathy [M54.12] Patient Active Problem List   Diagnosis Date Noted   Cervical radiculopathy 01/15/2021   Polysubstance abuse (HCC) 01/15/2021   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/15/2021   Exercise-induced asthma 01/24/2020   History of drug use 01/24/2020   PTSD (post-traumatic stress disorder) 01/24/2020   Accidental overdose 06/07/2013   Suicidal ideation 04/11/2013   Overdose 04/11/2013   Depressive disorder 04/11/2013   Nicotine dependence 04/15/2011   PCP:  Mattie Marlin, DO Pharmacy:   [facility] [phone] - [place], [place] - [id number]  [address] [place] [address] Phone: [phone] Fax: [phone]     Social Determinants of Health (SDOH) Interventions    Readmission Risk Interventions No flowsheet data found.

## 2021-01-16 NOTE — Progress Notes (Signed)
Pt's BP on soft end, MD on call made aware,no new orders at the moment but to page him if MAP goes below 60, will update him of any changes

## 2021-01-16 NOTE — TOC CAGE-AID Note (Signed)
Transition of Care [facility]) - CAGE-AID Screening   Patient Details  Name: Ruben [name] MRN: [id number] Date of Birth: 23-Feb-1991  Transition [facility]) CM/SW Contact:    Kermit Balo, RN Phone Number: 01/16/2021, 3:38 PM   Clinical Narrative: Pt refused resources   CAGE-AID Screening:    Have You Ever Felt You Ought to Cut Down on Your Drinking or Drug Use?: Yes Have People Annoyed You By Critizing Your Drinking Or Drug Use?: No Have You Felt Bad Or Guilty About Your Drinking Or Drug Use?: Yes Have You Ever Had a Drink or Used Drugs First Thing In The Morning to Steady Your Nerves or to Get Rid of a Hangover?: No CAGE-AID Score: 2  Substance Abuse Education Offered: Yes (patient refused resources for inpatient/ outpatient drug counseling)

## 2021-01-16 NOTE — Evaluation (Signed)
Physical Therapy Evaluation Patient Details Name: Ruben [name] MRN: [id number] DOB: [birth year] Today's Date: 01/16/2021   History of Present Illness  30 y.o. male initially presenting to ED on 6/14 c/o worsening numbness/tingling in hands/feet, decreased sensation and gait instability with falls x1 month. 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 Patient also found to have B12 deficiency. Patient left AMA returning to ED 6/16. PMHx significant for ADHD, asthma, PTSD, polysubstance abuse (drug screen (+) for multiple substances this admission), depression with prior suicide attempt and bipolar disorder.  Clinical Impression  PTA, patient was living with parents vs with girlfriend and reports independence with mobility. Patient and girlfriend report at minimum 6 falls in last 2 weeks due to numbness/weakness/incoordination. Patient currently requiring min-modA for ambulation with no AD but has numerous LOBs throughout. Patient presents with impaired coordination, impaired balance, generalized weakness, poor safety awareness, impaired sensation, and impaired functional mobility. Patient tearful and frustrated at end of session when discussing situation, asked for privacy. Patient will benefit from skilled PT services during acute stay to address listed deficits. Recommend OPPT and 24 hour supervision/assistance at discharge to address balance, coordination, and strength deficits.     Follow Up Recommendations Outpatient PT;Supervision/Assistance - 24 hour    Equipment Recommendations  Other (comment) (TBD)    Recommendations for Other Services       Precautions / Restrictions Precautions Precautions: Fall Precaution Comments: High fall risk; impulsive; poor safety awareness Restrictions Weight Bearing Restrictions: No      Mobility  Bed Mobility Overal bed mobility: Needs Assistance Bed Mobility: Supine to Sit        Sit to supine: Supervision   General bed mobility comments: Supervision A for safety.    Transfers Overall transfer level: Needs assistance Equipment used: None Transfers: Sit to/from Stand Sit to Stand: Min guard         General transfer comment: min guard to stand and steady  Ambulation/Gait Ambulation/Gait assistance: Min assist;Mod assist Gait Distance (Feet): 150 Feet Assistive device: None Gait Pattern/deviations: Step-through pattern;Ataxic;Scissoring;Drifts right/left;Staggering right;Staggering left;Wide base of support;Narrow base of support Gait velocity: varying   General Gait Details: ataxic gait pattern throughout. Varying BOS and multiple LOB during ambulation with min-modA to maintain balance and recover LOBs  Stairs            Wheelchair Mobility    Modified Rankin (Stroke Patients Only)       Balance Overall balance assessment: Needs assistance Sitting-balance support: No upper extremity supported;Feet supported Sitting balance-Leahy Scale: Fair     Standing balance support: During functional activity;No upper extremity supported Standing balance-Leahy Scale: Poor Standing balance comment: min-modA to maintain balance with dynamic balance tasks                             Pertinent Vitals/Pain Pain Assessment: No/denies pain    Home Living Family/patient expects to be discharged to:: Private residence Living Arrangements: Spouse/significant other;Parent (Initially reports living with parents but then states that he lives with his girlfriend.) Available Help at Discharge: Family;Friend(s) Type of Home: House Home Access: Stairs to enter Entrance Stairs-Rails: None Entrance Stairs-Number of Steps: 1 Home Layout: Two level Home Equipment: None      Prior Function Level of Independence: Independent         Comments: Works at [employer]; I with ADLs/IADLs; does not drive; girlfriend provides transportation  Hand  Dominance   Dominant Hand: Right    Extremity/Trunk Assessment   Upper Extremity Assessment Upper Extremity Assessment: Defer to OT evaluation    Lower Extremity Assessment Lower Extremity Assessment: RLE deficits/detail;LLE deficits/detail RLE Deficits / Details: MMT 5/5; patient reports R weaker than L RLE Sensation: decreased light touch RLE Coordination: decreased fine motor;decreased gross motor LLE Deficits / Details: MMT 5/5 LLE Sensation: decreased light touch LLE Coordination: decreased fine motor;decreased gross motor    Cervical / Trunk Assessment Cervical / Trunk Assessment: Normal  Communication   Communication: No difficulties  Cognition Arousal/Alertness: Awake/alert Behavior During Therapy: Restless Overall Cognitive Status: Impaired/Different from baseline Area of Impairment: Safety/judgement                         Safety/Judgement: Decreased awareness of safety;Decreased awareness of deficits     General Comments: Patient with poor safety awareness and awareness of deficits with no adjustment in gait following LOB x5 during first 5' of ambulation      General Comments      Exercises     Assessment/Plan    PT Assessment Patient needs continued PT services  PT Problem List Decreased strength;Decreased activity tolerance;Decreased balance;Decreased mobility;Decreased coordination;Decreased safety awareness;Impaired sensation       PT Treatment Interventions DME instruction;Gait training;Stair training;Functional mobility training;Therapeutic activities;Therapeutic exercise;Balance training;Neuromuscular re-education;Patient/family education    PT Goals (Current goals can be found in the Care Plan section)  Acute Rehab PT Goals 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 PT Goal Formulation: With patient Time For Goal Achievement: 01/30/21 Potential to Achieve Goals: Fair    Frequency Min 4X/week   Barriers to discharge         Co-evaluation               AM-PAC PT "6 Clicks" Mobility  Outcome Measure Help needed turning from your back to your side while in a flat bed without using bedrails?: A Little Help needed moving from lying on your back to sitting on the side of a flat bed without using bedrails?: A Little Help needed moving to and from a bed to a chair (including a wheelchair)?: A Little Help needed standing up from a chair using your arms (e.g., wheelchair or bedside chair)?: A Little Help needed to walk in hospital room?: A Lot Help needed climbing 3-5 steps with a railing? : A Lot 6 Click Score: 16    End of Session Equipment Utilized During Treatment: Gait belt Activity Tolerance: Patient tolerated treatment well Patient left: in bed;with call bell/phone within reach;with family/visitor present Nurse Communication: Mobility status PT Visit Diagnosis: Unsteadiness on feet (R26.81);Muscle weakness (generalized) (M62.81);Ataxic gait (R26.0);History of falling (Z91.81);Difficulty in walking, not elsewhere classified (R26.2);Other symptoms and signs involving the nervous system (R42.706)    Time: 2376-2831 PT Time Calculation (min) (ACUTE ONLY): 15 min   Charges:   PT Evaluation $PT Eval Moderate Complexity: 1 Mod          Kynslei Art A. Dan Humphreys PT, DPT Acute Rehabilitation Services Pager [phone] Office [phone]   Viviann Spare 01/16/2021, 5:43 PM

## 2021-01-16 NOTE — Consult Note (Addendum)
Neurology Consult H&P  Ruben [name] MR# [id number] 01/16/2021   CC: upper and lower extremity weakness  History is obtained from: chart and patient  HPI: Ruben [name] is a 30 y.o. male with weakness and ataxia.  History taken from hospital admission note 01/15/2021:  "ADHD, polysubstance abuse, depression with prior suicide attempt, bipolar disorder who was seen in the ER 2 days ago and left early yesterday after complaining of progressive weakness and numbness of his upper extremity as well as some lower extremity weakness as well.  During the visit patient had work-up done including MRI of the C-spine T-spine as well as LP.  Neurology was consulted and evaluated patient.  Based on his LP results and MR I of the C-spine there was suspicion for subacute combined degeneration of the spinal cord due to patient'[name] habitus.  He is known to have used multiple substances and continues to use substances including cocaine that was present in his system yesterday.  Also sniffs and inhales whippets.  This contains nitric oxide which apparently can lead to vitamin B12 deficiency.  He had a sudden longitudinal extensive hyperintensity in the cervical spinal cord that gave rise to the suspicion.  Attempt to admit the patient yesterday for continued evaluation was done but he left AGAINST MEDICAL ADVICE.  Work-up done including his drug screen, vitamin B12 level, lumbar puncture which showed no evidence of infection, gram stain still pending, patient had oligoclonal bands and IgG index pending other antibodies ordered include neuromyelitis optica and the anti M0G antibodies.  Also MMA levels were ordered.  Patient was initiated on B12 replacement but before then he left AGAINST MEDICAL ADVICE.  Neurology has recommended MRI of the brain which was not done yesterday.  He is back again today and be readmitted."    ROS: A complete ROS was performed and is negative except as noted in the HPI.   Past  Medical History:  Diagnosis Date   ADHD (attention deficit hyperactivity disorder)    Anxiety    Depression    Suicide attempt (HCC)      Family History  Problem Relation Age of Onset   Hypertension Father    Cancer Paternal Grandfather     Social History:  reports that he has quit smoking. His smoking use included cigarettes. He smoked an average of 1.00 packs per day. He has never used smokeless tobacco. He reports current alcohol use. He reports current drug use. Drug: Marijuana.   Prior to Admission medications   Medication Sig Start Date End Date Taking? Authorizing Provider  albuterol (VENTOLIN HFA) 108 (90 Base) MCG/ACT inhaler Inhale 1 puff into the lungs every 6 (six) hours as needed for wheezing or shortness of breath.   Yes [provider]  amphetamine-dextroamphetamine (ADDERALL XR) 30 MG 24 hr capsule Take 30 mg by mouth daily. morning 12/22/20  Yes [provider]  amphetamine-dextroamphetamine (ADDERALL) 15 MG tablet Take 15 mg by mouth daily as needed (for agitation). In the afternoon 01/08/21  Yes [provider]  escitalopram (LEXAPRO) 10 MG tablet Take 10 mg by mouth daily. 01/08/21  Yes [provider]  lamoTRIgine (LAMICTAL) 200 MG tablet Take 200 mg by mouth daily. 01/11/21  Yes [provider]  LORazepam (ATIVAN) 2 MG tablet Take 2 mg by mouth daily as needed for anxiety. 01/08/21  Yes [provider]  polyvinyl alcohol (LIQUIFILM TEARS) 1.4 % ophthalmic solution Place 1 drop into both eyes daily as needed for dry eyes.  Yes [provider]  VRAYLAR 1.5 MG capsule Take 1.5 mg by mouth daily. 01/08/21  Yes [provider]  venlafaxine XR (EFFEXOR-XR) 150 MG 24 hr capsule Take 1 capsule (150 mg total) by mouth daily with breakfast. Patient not taking: No sig reported 12/27/13   Morrell Riddle, PA-C    Exam: Current vital signs: BP 113/73 (BP Location: Right Arm)   Pulse 75   Temp 97.8 F (36.6 C)  (Oral)   Resp 18   Ht 5\' 7"  (1.702 m)   Wt 71.7 kg   SpO2 100%   BMI 24.75 kg/m   Physical Exam  Constitutional: sleeping in bed appears well-developed and well-nourished.  Psych: Affect appropriate to situation Eyes: No scleral injection HENT: No OP obstruction. Head: Normocephalic.  Cardiovascular: Normal rate and regular rhythm.  Respiratory: Effort normal, symmetric excursions bilaterally, no audible wheezing. GI: Soft.  No distension. There is no tenderness.  Skin: WDI  Neuro: Mental Status: Patient is awake, alert, oriented to person, place, month, year, and situation. Patient is able to give a clear and coherent history. Speech  fluent, intact comprehension and repetition. No signs of aphasia or neglect. Visual Fields are full. Pupils are equal, round, and reactive to light. EOMI without ptosis or diploplia.  Facial sensation is symmetric to temperature Facial movement is symmetric.  Hearing is intact to voice. Uvula midline and palate elevates symmetrically. Shoulder shrug is symmetric. Tongue is midline without atrophy or fasciculations.  Tone is normal. Bulk is normal. 5/5 strength was present in all extremities except for very mild weakness in left lower extremity. Sensation is symmetric to light touch and temperature in the arms and legs. Deep Tendon Reflexes: ~1+ and symmetric in the biceps and patellae. Toes are downgoing bilaterally. FNF and HKS are intact bilaterally. Gait - Deferred  I have reviewed labs in epic and the pertinent results are:   Ref. Range 01/15/2021 17:40  Vitamin B12 Latest Ref Range: 180 - 914 pg/mL 671    Ref. Range 01/15/2021 16:23  MCV Latest Ref Range: 80.0 - 100.0 fL 99.2  MCH Latest Ref Range: 26.0 - 34.0 pg 35.5 (H)    Ref. Range 01/14/2021 03:00  Ceruloplasmin Latest Ref Range: 16.0 - 31.0 mg/dL 01/16/2021  Copper Latest Ref Range: 63 - 121 ug/dL 97   I have reviewed the images obtained: MRI brain did not show acute ischemic  intracranial process, hemorrhage or mass. MRI cervical spine showed non-enhancing longitudinally extensive abnormal cord T2 hyperintensity spanning cervical levels and confined to the gray matter.   MRI C-spine     Assessment: Ruben SNELGROVE is a 30 y.o. 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  Review of labs shows normal copper and ceruloplasmin levels. Though MCV >115 fL is generally more specific for megaloblastic/ vitamin B12 or folate deficiency, a normal MCV does not exclude either of the deficiencies. B12 levels were normal with normal liver function however no folate levels done. May need folate level or alternatively methylmalonic acid and homocysteine (ordered/pending) levels. He was started on B12 supplementation before leaving OSH AMA and recommend continuing. Since he is at risk for leaving AMA  recommend more aggressive B12 supplementation both IM and oral. He will need to continue B12 oral indefinitely.  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   Plan: - Continue high dose B12 supplementation 1000  mcg IM daily for 7 days then continue IM 1000?g once per month until symptoms resolve. Start B12 2000?g daily orally and continue indefinitely. Advised to abstain from illicit and nitrous oxide inhalation.  and 2000?g oral daily. - He may need HIV and RPR labs. - Recommend PT/OT.  Neurology will remain available, please call for questions.  Electronically signed by:  Marisue Humble, MD Page: [phone] 01/16/2021, 3:14 AM

## 2021-01-16 NOTE — Evaluation (Signed)
Occupational Therapy Evaluation Patient Details Name: Ruben [name] MRN: [id number] DOB: [birth year] Today's Date: 01/16/2021    History of Present Illness 30 y.o. male initially presenting to ED on 6/14 c/o worsening numbness/tingling in hands/feet, decreased sensation and gait instability with falls x1 month. 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 Patient also found to have B12 deficiency. Patient left AMA returning to ED 6/16. PMHx significant for ADHD, asthma, PTSD, polysubstance abuse (drug screen (+) for multiple substances this admission), depression with prior suicide attempt and bipolar disorder.   Clinical Impression   PTA patient was living with his parents vs with his girlfriend and was grossly I with ADLs/IADLs. 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 Patient states that he does not drive. Patient currently functioning below baseline demonstrating observed ADLs including LB dressing and ADL transfers with Min guard to Min A grossly. Patient also limited by deficits listed below including decreased proprioception, decreased static/dynamic standing balance, decreased sensation, impulsivity and decreased safety awareness and would benefit from continued acute OT services in prep for safe d/c to next level of care. Per patient report, family and girlfriend able to provide necessary supervision/assist and can provide transportation to OP therapies. OT will continue to follow acutely.      Follow Up Recommendations  Outpatient OT;Supervision/Assistance - 24 hour    Equipment Recommendations  3 in 1 bedside commode    Recommendations for Other Services       Precautions / Restrictions Precautions Precautions: Fall Precaution Comments: High fall risk; impulsive; poor safety awareness Restrictions Weight Bearing Restrictions: No      Mobility  Bed Mobility Overal bed mobility: Needs Assistance Bed Mobility: Sit to Supine       Sit to supine: Supervision   General bed mobility comments: Supervision A for safety.    Transfers Overall transfer level: Needs assistance Equipment used: 1 person hand held assist Transfers: Sit to/from Stand Sit to Stand: Min guard         General transfer comment: Min guard and HHA for steadying.    Balance Overall balance assessment: Needs assistance Sitting-balance support: No upper extremity supported;Feet supported Sitting balance-Leahy Scale: Fair Sitting balance - Comments: Able to don footwear in unsupported sitting position with out overt LOB.   Standing balance support: Single extremity supported;During functional activity Standing balance-Leahy Scale: Poor Standing balance comment: Able to maintain static standing balance with Min guard for safety. Reliant on at least unilateral UE support with dynamic balance tasks.                           ADL either performed or assessed with clinical judgement   ADL Overall ADL's : Needs assistance/impaired Eating/Feeding: Set up;Sitting   Grooming: Set up;Sitting               Lower Body Dressing: Min guard;Sit to/from stand Lower Body Dressing Details (indicate cue type and reason): Min guard for steadying. Toilet Transfer: Hydrographic surveyor Details (indicate cue type and reason): Simulated with transfer from recliner with Min guard for steadying.           General ADL Comments: Education on use of urinal and pushing call bell for assistance to bathroom for BM secondary to balance deficits. Patient expressed verbal understanding.     Vision Baseline Vision/History: Wears glasses Patient  Visual Report: No change from baseline       Perception     Praxis      Pertinent Vitals/Pain Pain Assessment: No/denies pain     Hand Dominance Right   Extremity/Trunk Assessment Upper Extremity  Assessment Upper Extremity Assessment: RUE deficits/detail;LUE deficits/detail RUE Deficits / Details: AROM WFL; MMT grossly 5/5 RUE Sensation: decreased light touch (Reports numbness from elbow to fingertips bilaterally.) RUE Coordination: decreased fine motor (Increased time/effort to don face mask) LUE Deficits / Details: AROM WFL; MMT grossly 5/5 LUE Sensation: decreased light touch (Reports numbness from elbow to fingertips bilaterally) LUE Coordination: decreased fine motor (Increased time/effort to don face mask)   Lower Extremity Assessment Lower Extremity Assessment: Defer to PT evaluation (Reports numbness/tingling in legs/feet)   Cervical / Trunk Assessment Cervical / Trunk Assessment: Normal   Communication Communication Communication: No difficulties   Cognition Arousal/Alertness: Awake/alert Behavior During Therapy: Restless Overall Cognitive Status: Impaired/Different from baseline Area of Impairment: Safety/judgement                         Safety/Judgement: Decreased awareness of safety;Decreased awareness of deficits     General Comments: Patient attempting to run with functional mobility in hallway despite balance deficits and cues from this therapist.   General Comments  Girlfriend seated EOB. States abiltiy to provide PRN supervision/assist at time of discharge.    Exercises     Shoulder Instructions      Home Living Family/patient expects to be discharged to:: Private residence Living Arrangements: Spouse/significant other;Parent (Initially reports living with parents but then states that he lives with his girlfriend.) Available Help at Discharge: Family;Friend(s) Type of Home: House Home Access: Stairs to enter [employer] of Steps: 1 Entrance Stairs-Rails: None Home Layout: Two level Alternate Level Stairs-Number of Steps: Full flight   Bathroom Shower/Tub: Chief Strategy Officer: Standard     Home Equipment:  None          Prior Functioning/Environment Level of Independence: Independent        Comments: Works at [employer]; I with ADLs/IADLs; does not drive; girlfriend provides transportation        OT Problem List: Impaired balance (sitting and/or standing);Decreased coordination;Decreased safety awareness;Decreased knowledge of use of DME or AE;Impaired sensation;Impaired UE functional use      OT Treatment/Interventions: Self-care/ADL training;Therapeutic exercise;Neuromuscular education;DME and/or AE instruction;Therapeutic activities;Patient/family education;Balance training    OT Goals(Current goals can be found in the care plan section) Acute Rehab OT Goals 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 OT Goal Formulation: With patient Time For Goal Achievement: 01/30/21 Potential to Achieve Goals: Good ADL Goals Pt Will Perform Grooming: with modified independence;standing Pt Will Perform Upper Body Dressing: with modified independence;sitting Pt Will Perform Lower Body Dressing: with modified independence;sit to/from stand Pt Will Transfer to Toilet: with modified independence;ambulating Pt Will Perform Toileting - Clothing Manipulation and hygiene: with modified independence;sit to/from stand Pt Will Perform Tub/Shower Transfer: Tub transfer;3 in 1;ambulating;with modified independence;rolling walker Additional ADL Goal #1: Patient will independently return demo BUE NMR HEP with use of written handout.  OT Frequency: Min 2X/week   Barriers to D/C:            Co-evaluation              AM-PAC OT "6 Clicks" Daily Activity     Outcome Measure Help from another person eating meals?: A Little Help from another person taking care of  personal grooming?: A Little Help from another person toileting, which includes using toliet, bedpan, or urinal?: A Little Help from another person bathing (including washing, rinsing, drying)?: A Little Help from another person  to put on and taking off regular upper body clothing?: A Little Help from another person to put on and taking off regular lower body clothing?: A Little 6 Click Score: 18   End of Session Equipment Utilized During Treatment: Gait belt Nurse Communication: Mobility status  Activity Tolerance: Patient tolerated treatment well Patient left: in bed;with call bell/phone within reach;with bed alarm set  OT Visit Diagnosis: Unsteadiness on feet (R26.81);Other abnormalities of gait and mobility (R26.89)                Time: 1254-1320 OT Time Calculation (min): 26 min Charges:  OT General Charges $OT Visit: 1 Visit OT Evaluation $OT Eval Moderate Complexity: 1 Mod OT Treatments $Neuromuscular Re-education: 8-22 mins  Israa Caban H. OTR/L Supplemental OT, Department of rehab services [phone]  Lebert Lovern R H. 01/16/2021, 1:52 PM

## 2021-01-16 NOTE — Progress Notes (Signed)
Progress Note    Ruben [name]  YIF:[id number] DOB: October 18, 1990  DOA: 01/15/2021 PCP: Mattie Marlin, DO    Brief Narrative:    Medical records reviewed and are as summarized below:  Ruben [name] is an 30 y.o. male with medical history significant of ADHD, polysubstance abuse, depression with prior suicide attempt, bipolar disorder who was seen in the ER 2 days ago and left early yesterday after complaining of progressive weakness and numbness of his upper extremity as well as some lower extremity weakness as well.   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 - neurology consult: Since he is at risk for leaving AMA  recommend more aggressive B12 supplementation both IM and oral. He will need to continue B12 oral indefinitely. -Continue high dose B12 supplementation 1000 mcg IM daily for 7 days then continue IM 1000?g once per month until symptoms resolve. Start B12 2000?g daily orally and continue indefinitely. Advised to abstain from illicit and nitrous oxide inhalation.  and 2000?g oral daily. -TOC consult  ADHD: Patient on Adderall at home.  He takes both long-acting and short acting.  We will continue with current treatment.   PTSD:  -Continue home regimen   depression with anxiety:  -continue with Lexapro and Ativan.   bipolar disorder: On Lamictal.  Continue   exercise-induced asthma: Continue albuterol as needed    Family Communication/Anticipated D/C date and plan/Code Status   DVT prophylaxis: scd Code Status: Full Code.   Disposition Plan: Status is: Inpatient  Remains inpatient appropriate because:Inpatient level  of care appropriate due to severity of illness  Dispo: The patient is from: Home              Anticipated d/c is to: Home              Patient currently is not medically stable to d/c.   Difficult to place patient No         Medical Consultants:   [email or web address]  Subjective:   From [place] marijuana is legal.  No clue where the cocaine in his system came from  Objective:    Vitals:   01/16/21 0015 01/16/21 0405 01/16/21 0440 01/16/21 0844  BP: 113/73 (!) 89/61 (!) 90/57 110/75  Pulse: 75 (!) 57 62 94  Resp: 18 18  17   Temp: 97.8 F (36.6 C) 98.9 F (37.2 C)  98 F (36.7 C)  TempSrc: Oral   Oral  SpO2: 100% 99% 100% 100%  Weight:      Height:       No intake or output data in the 24 hours ending 01/16/21 1034 Filed Weights   01/15/21 1502  Weight: 71.7 kg    Exam:  General: Appearance:    Well developed, well nourished male in no acute distress     Lungs:     respirations unlabored  Heart:    Normal heart rate.    MS:   All extremities are intact.    Neurologic:   Awake, alert, fidgety      Data Reviewed:   I have personally reviewed following labs and imaging studies:  Labs: Labs show the following:   Basic  Metabolic Panel: Recent Labs  Lab 01/13/21 1203 01/15/21 1623 01/16/21 0358  NA 138 137 136  K 3.9 3.8 3.6  CL 103 104 103  CO2 27 24 26   GLUCOSE 91 86 103*  BUN 11 15 13   CREATININE 0.88 0.96 0.93  CALCIUM 9.2 9.2 9.1  MG 2.2  --   --   PHOS 2.3*  --   --    GFR Estimated Creatinine Clearance: 110.6 mL/min (by C-G formula based on SCr of 0.93 mg/dL). Liver Function Tests: Recent Labs  Lab 01/13/21 1203 01/13/21 2343 01/16/21 0358  AST 20  --  18  ALT 27  --  21  ALKPHOS 43  --  41  BILITOT 0.4  --  0.7  PROT 7.7  --  5.9*  ALBUMIN 4.5 4.4 3.7   No results for input(s): LIPASE, AMYLASE in the last 168 hours. No results for input(s): AMMONIA in the last 168 hours. Coagulation profile No results for input(s):  INR, PROTIME in the last 168 hours.  CBC: Recent Labs  Lab 01/13/21 1203 01/15/21 1623 01/16/21 0358  WBC 4.3 5.7 5.7  NEUTROABS 2.0 3.1  --   HGB 14.0 14.0 13.4  HCT 40.0 39.1 37.5*  MCV 99.8 99.2 97.9  PLT 268 268 229   Cardiac Enzymes: No results for input(s): CKTOTAL, CKMB, CKMBINDEX, TROPONINI in the last 168 hours. BNP (last 3 results) No results for input(s): PROBNP in the last 8760 hours. CBG: No results for input(s): GLUCAP in the last 168 hours. D-Dimer: No results for input(s): DDIMER in the last 72 hours. Hgb A1c: No results for input(s): HGBA1C in the last 72 hours. Lipid Profile: No results for input(s): CHOL, HDL, LDLCALC, TRIG, CHOLHDL, LDLDIRECT in the last 72 hours. Thyroid function studies: No results for input(s): TSH, T4TOTAL, T3FREE, THYROIDAB in the last 72 hours.  Invalid input(s): FREET3 Anemia work up: Recent Labs    01/15/21 1740  VITAMINB12 671   Sepsis Labs: Recent Labs  Lab 01/13/21 1203 01/15/21 1623 01/16/21 0358  WBC 4.3 5.7 5.7    Microbiology Recent Results (from the past 240 hour(s))  CSF culture w Gram Stain     Status: None (Preliminary result)   Collection Time: 01/13/21 11:40 PM   Specimen: CSF; Cerebrospinal Fluid  Result Value Ref Range Status   Specimen Description   Final    CSF Performed at [facility], [address] [place], [address] [place]    Special Requests   Final    TUBE 4 Performed at [facility], [address] [place], [address] [place]    Gram Stain   Final    NO WBC SEEN NO ORGANISMS SEEN RESULT CALLED TO, READ BACK BY AND VERIFIED WITH: RN h shepherd at 0040 01/14/21 [place] a Performed at [facility], [address], [place], [place]    Culture   Final    NO GROWTH 2 DAYS Performed at [facility], [address] [place], [address] [place]    Report Status PENDING  Incomplete  SARS CORONAVIRUS 2 (TAT 6-24 HRS) Nasopharyngeal  Nasopharyngeal Swab     Status: None   Collection Time: 01/15/21  4:23 PM   Specimen: Nasopharyngeal Swab  Result Value Ref Range Status   SARS Coronavirus 2 NEGATIVE NEGATIVE Final    Comment: (NOTE) SARS-CoV-2 target nucleic acids are NOT DETECTED.  The SARS-CoV-2 RNA is generally detectable in upper and lower respiratory specimens  during the acute phase of infection. Negative results do not preclude SARS-CoV-2 infection, do not rule out co-infections with other pathogens, and should not be used as the sole basis for treatment or other patient management decisions. Negative results must be combined with clinical observations, patient history, and epidemiological information. The expected result is Negative.  Fact Sheet for Patients: HairSlick.no  Fact Sheet for Healthcare Providers: quierodirigir.com  This test is not yet approved or cleared by the [place] FDA and  has been authorized for detection and/or diagnosis of SARS-CoV-2 by FDA under an Emergency Use Authorization (EUA). This EUA will remain  in effect (meaning this test can be used) for the duration of the COVID-19 declaration under Se ction 564(b)(1) of the Act, 21 U.S.C. section 360bbb-3(b)(1), unless the authorization is terminated or revoked sooner.  Performed at [facility], [address] [place], [place] [address]     Procedures and diagnostic studies:  MR BRAIN W WO CONTRAST  Result Date: 01/16/2021 CLINICAL DATA:  Tingling, numbness and weakness EXAM: MRI HEAD WITHOUT AND WITH CONTRAST TECHNIQUE: Multiplanar, multiecho pulse sequences of the brain and surrounding structures were obtained without and with intravenous contrast. CONTRAST:  12mL GADAVIST GADOBUTROL 1 MMOL/ML IV SOLN COMPARISON:  None. FINDINGS: Brain: No acute infarct, mass effect or extra-axial collection. No acute or chronic hemorrhage. Normal white matter signal, parenchymal  volume and CSF spaces. The midline structures are normal. There is no abnormal contrast enhancement. Vascular: Major flow voids are preserved. Skull and upper cervical spine: Normal calvarium and skull base. Visualized upper cervical spine and soft tissues are normal. Sinuses/Orbits:No paranasal sinus fluid levels or advanced mucosal thickening. No mastoid or middle ear effusion. Normal orbits. IMPRESSION: Normal brain MRI. Electronically Signed   By: Deatra Robinson M.D.   On: 01/16/2021 01:18    Medications:    amphetamine-dextroamphetamine  30 mg Oral Daily   cariprazine  1.5 mg Oral Daily   cyanocobalamin  1,000 mcg Intramuscular Daily   escitalopram  10 mg Oral Daily   lamoTRIgine  200 mg Oral Daily   vitamin B-12  2,000 mcg Oral Daily   Continuous Infusions:   LOS: 1 day   Joseph Art  [facility]   How to contact the [facility] Attending or Consulting provider 7A - 7P or covering provider during after hours 7P -7A, for this patient?  Check the care team in [facility] and look for a) attending/consulting TR[facility] provider listed and b) the [facility] team listed Log into [email or web address] and use [place]'s universal password to access. If you do not have the password, please contact the hospital operator. Locate the [facility] provider you are looking for under [facility] and page to a number that you can be directly reached. If you still have difficulty reaching the provider, please page the [facility] (Director on Call) for the Hospitalists listed on amion for assistance.  01/16/2021, 10:34 AM

## 2021-01-17 LAB — CSF CULTURE W GRAM STAIN
Culture: NO GROWTH
Gram Stain: NONE SEEN

## 2021-01-17 LAB — HOMOCYSTEINE: Homocysteine: 75.3 umol/L — ABNORMAL HIGH (ref 0.0–14.5)

## 2021-01-17 MED ORDER — CYANOCOBALAMIN 2000 MCG PO TABS: 2000.0000 ug | ORAL_TABLET | Freq: Every day | ORAL | 0 refills | Status: DC

## 2021-01-17 NOTE — Plan of Care (Signed)
  Problem: Education: Goal: Knowledge of General Education information will improve Description: Including pain rating scale, medication(s)/side effects and non-pharmacologic comfort measures Outcome: Adequate for Discharge   Problem: Health Behavior/Discharge Planning: Goal: Ability to manage health-related needs will improve Outcome: Adequate for Discharge   Problem: Clinical Measurements: Goal: Ability to maintain clinical measurements within normal limits will improve Outcome: Adequate for Discharge Goal: Will remain free from infection Outcome: Adequate for Discharge Goal: Diagnostic test results will improve Outcome: Adequate for Discharge Goal: Respiratory complications will improve Outcome: Adequate for Discharge Goal: Cardiovascular complication will be avoided Outcome: Adequate for Discharge   Problem: Activity: Goal: Risk for activity intolerance will decrease Outcome: Adequate for Discharge   Problem: Nutrition: Goal: Adequate nutrition will be maintained Outcome: Adequate for Discharge   Problem: Coping: Goal: Level of anxiety will decrease Outcome: Adequate for Discharge   Problem: Elimination: Goal: Will not experience complications related to bowel motility Outcome: Adequate for Discharge Goal: Will not experience complications related to urinary retention Outcome: Adequate for Discharge   Problem: Pain Managment: Goal: General experience of comfort will improve Outcome: Adequate for Discharge   Problem: Safety: Goal: Ability to remain free from injury will improve Outcome: Adequate for Discharge   Problem: Skin Integrity: Goal: Risk for impaired skin integrity will decrease Outcome: Adequate for Discharge   Problem: Acute Rehab OT Goals (only OT should resolve) Goal: Pt. Will Perform Grooming Outcome: Adequate for Discharge Goal: Pt. Will Perform Upper Body Dressing Outcome: Adequate for Discharge Goal: Pt. Will Perform Lower Body  Dressing Outcome: Adequate for Discharge Goal: Pt. Will Transfer To Toilet Outcome: Adequate for Discharge Goal: Pt. Will Perform Toileting-Clothing Manipulation Outcome: Adequate for Discharge Goal: Pt. Will Perform Tub/Shower Transfer Outcome: Adequate for Discharge Goal: OT Additional ADL Goal #1 Outcome: Adequate for Discharge   Problem: Acute Rehab PT Goals(only PT should resolve) Goal: Patient Will Transfer Sit To/From Stand Outcome: Adequate for Discharge Goal: Pt Will Ambulate Outcome: Adequate for Discharge Goal: Pt Will Go Up/Down Stairs Outcome: Adequate for Discharge Goal: Pt/caregiver will Perform Home Exercise Program Outcome: Adequate for Discharge Discharge instructions reviewed with patient.  These included, but were not limited to, the following:  discharge medications, prescriptions, follow-up appointments, activity recommendations, when to call the MD, etc.  Patient not willing to wait for FWRW to be delivered to his room.  Escorted to exit via wheelchair by nurse tech to exit.  Pt discharged to home via girlfriend's vehicle.

## 2021-01-17 NOTE — Progress Notes (Signed)
OT Cancellation Note  Patient Details Name: ZANDYR BARNHILL MRN: [id number] DOB: June 13, 1991   Cancelled Treatment:    Reason Eval/Treat Not Completed: Other (comment). Attempted skilled OT session with pt.  Initially receptive to eob/oob for adls and/or HEP for UEs and hands.  Pt. Then began to list reasons he did not want to or could not participate.  Expressed feeling he would not get stronger but continued to decline HEP and other tasks that would contribute to strengthening.  Offered multiple options and he declined all of them.  Took blanket and covered his entire body including head while I was talking to him.  Assisted with moving blanket off of his head so we could continue our conversation.  Provided emotional support and tissues as he was tearful.  Pt. Then states "I guess ill just have to go AMA I guess".  Reviewed risks if he left before he was medically and physically able.  Encouraged him to participate and try.  He cont. To decline listing conflicting reasons for what he wanted ie: strength but also declining physical activity.    Alessandra Bevels Lorraine-COTA/L 01/17/2021, 11:23 AM

## 2021-01-17 NOTE — Progress Notes (Signed)
Physical Therapy Treatment Patient Details Name: Ruben [name] MRN: [id number] DOB: [birth year] Today's Date: 01/17/2021    History of Present Illness 30 y.o. male initially presenting to ED on 6/14 c/o worsening numbness/tingling in hands/feet, decreased sensation and gait instability with falls x1 month. 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 Patient also found to have B12 deficiency. Patient left AMA returning to ED 6/16. PMHx significant for ADHD, asthma, PTSD, polysubstance abuse (drug screen (+) for multiple substances this admission), depression with prior suicide attempt and bipolar disorder.    PT Comments    Pt progressing towards his physical therapy goals; he is agreeable to participate. Session focused on gait and stair training. Pt initially requiring min-mod assist for ambulation with no assistive device; improved stability with use of walker compared to without. Pt ambulating a total of ~350 ft; able to negotiate x 2 steps with bilateral railings. Pt continues with sensation impairments, decreased balance, ataxia/decreased coordination, gait abnormalities. Will benefit from follow up OPPT to address and maximize functional independence.     Follow Up Recommendations  Outpatient PT;Supervision/Assistance - 24 hour     Equipment Recommendations  Rolling walker with 5" wheels    Recommendations for Other Services       Precautions / Restrictions Precautions Precautions: Fall Precaution Comments: High fall risk; impulsive; poor safety awareness Restrictions Weight Bearing Restrictions: No    Mobility  Bed Mobility Overal bed mobility: Modified Independent                  Transfers Overall transfer level: Needs assistance Equipment used: None Transfers: Sit to/from Stand Sit to Stand: Min guard         General transfer comment: min guard to stand and  steady  Ambulation/Gait Ambulation/Gait assistance: Min assist;Mod assist Gait Distance (Feet): 350 Feet Assistive device: None;Rolling walker (2 wheeled) Gait Pattern/deviations: Step-through pattern;Ataxic;Scissoring;Drifts right/left;Narrow base of support;Decreased dorsiflexion - right;Decreased dorsiflexion - left Gait velocity: varying   General Gait Details: Cues for wider BOS, heel strike at initial contact, decreased knee hyperextension, upward gaze, decreased stride length for control, avoiding scissoring with turning. Pt with multiple episodes of LOB, requiring min-modA; improved with use of walker.   Stairs Stairs: Yes Stairs assistance: Min guard Stair Management: Two rails Number of Stairs: 2 General stair comments: Cues for step by step, min guard for safety   Wheelchair Mobility    Modified Rankin (Stroke Patients Only)       Balance Overall balance assessment: Needs assistance Sitting-balance support: No upper extremity supported;Feet supported Sitting balance-Leahy Scale: Good     Standing balance support: During functional activity;No upper extremity supported Standing balance-Leahy Scale: Poor Standing balance comment: min-modA to maintain balance with dynamic balance tasks                            Cognition Arousal/Alertness: Awake/alert Behavior During Therapy: Restless Overall Cognitive Status: Impaired/Different from baseline Area of Impairment: Safety/judgement                         Safety/Judgement: Decreased awareness of safety;Decreased awareness of deficits     General Comments: Patient with poor safety awareness and awareness of deficits      Exercises      General Comments        Pertinent Vitals/Pain Pain Assessment: Faces Faces Pain Scale: Hurts  a little bit Pain Location: low back Pain Descriptors / Indicators: Tightness Pain Intervention(s): Monitored during session    Home Living                       Prior Function            PT Goals (current goals can now be found in the care plan section) Acute Rehab PT Goals 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 PT Goal Formulation: With patient Time For Goal Achievement: 01/30/21 Potential to Achieve Goals: Fair Progress towards PT goals: Progressing toward goals    Frequency    Min 4X/week      PT Plan Equipment recommendations need to be updated    Co-evaluation              AM-PAC PT "6 Clicks" Mobility   Outcome Measure  Help needed turning from your back to your side while in a flat bed without using bedrails?: None Help needed moving from lying on your back to sitting on the side of a flat bed without using bedrails?: None Help needed moving to and from a bed to a chair (including a wheelchair)?: A Little Help needed standing up from a chair using your arms (e.g., wheelchair or bedside chair)?: A Little Help needed to walk in hospital room?: A Lot Help needed climbing 3-5 steps with a railing? : A Little 6 Click Score: 19    End of Session Equipment Utilized During Treatment: Gait belt Activity Tolerance: Patient tolerated treatment well Patient left: in bed;with call bell/phone within reach;with family/visitor present Nurse Communication: Mobility status PT Visit Diagnosis: Unsteadiness on feet (R26.81);Muscle weakness (generalized) (M62.81);Ataxic gait (R26.0);History of falling (Z91.81);Difficulty in walking, not elsewhere classified (R26.2);Other symptoms and signs involving the nervous system (K81.275)     Time: 1700-1749 PT Time Calculation (min) (ACUTE ONLY): 24 min  Charges:  $Gait Training: 23-37 mins                     Lillia Pauls, Francis, DPT Acute Rehabilitation Services Pager [phone] Office [phone]    Norval Morton 01/17/2021, 2:27 PM

## 2021-01-17 NOTE — TOC Transition Note (Signed)
Transition of Care [facility]) - CM/SW Discharge Note   Patient Details  Name: EMMAUS BRANDI MRN: [id number] Date of Birth: [birth year]  [facility] CM/SW Contact:  Lawerance Sabal, RN Phone Number: 01/17/2021, 1:48 PM   Clinical Narrative:     Notified by PT that patient agreeable to RW. Spoke w patient over the phone, he confirmed he is agreeable to RW, it will be delivered to his room before he leaves, he understands it may take around an hour.  Referral previously placed to [facility] via Epic by CM yesterday and patient understands he will get a call from them this week to schedule. Contact info is on AVS. No other TOC needs identified.       Barriers to Discharge: Continued Medical Work up   Patient Goals and CMS Choice     Choice offered to / list presented to : Patient  Discharge Placement                       Discharge Plan and Services   Discharge Planning Services: CM Consult            DME Arranged: Dan Humphreys rolling DME Agency: [employer] Date DME Agency Contacted: 01/17/21 Time DME Agency Contacted: [phone] Representative spoke with at DME Agency: Jasmine            Social Determinants of Health (SDOH) Interventions     Readmission Risk Interventions No flowsheet data found.

## 2021-01-17 NOTE — Discharge Summary (Signed)
Physician Discharge Summary  Ruben [name] ZOX:[id number] DOB: [birth year] DOA: 01/15/2021  PCP: Mattie Marlin, DO  Admit date: 01/15/2021 Discharge date: 01/17/2021  Admitted From: Home Disposition:  Home  Recommendations for Outpatient Follow-up:  Follow up with PCP in 1 week Follow up with neurology in 2 to 3 weeks Please obtain BMP/CBC in 1 week. Please follow up on the following pending results: methylmalonic acid, CSF oligoclonal bands, fungal culture Continue vitamin B12 supplementation Be sure to follow-up with the physical therapy team  Home Health: Outpatient PT Equipment/Devices: Rolling walker  Discharge Condition: Good CODE STATUS: Full Diet recommendation: General  Brief/Interim Summary: From H&P: Dr. Vedia Pereyra "Ruben [name] is a 30 y.o. male with medical history significant of ADHD, polysubstance abuse, depression with prior suicide attempt, bipolar disorder who was seen in the ER 2 days ago and left early yesterday after complaining of progressive weakness and numbness of his upper extremity as well as some lower extremity weakness as well.  During the visit patient had work-up done including MRI of the C-spine T-spine as well as LP.  Neurology was consulted and evaluated patient.  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  He is known to have used multiple substances and continues to use substances including cocaine that was present in his system yesterday.  Also sniffs and inhales whippets.  This contains nitric oxide which apparently can lead to vitamin B12 deficiency.  He had a sudden longitudinal extensive hyperintensity in the cervical spinal cord that gave rise to the suspicion.  Attempt to admit the patient yesterday for continued evaluation was done but he left AGAINST MEDICAL ADVICE.  Work-up done including his drug screen, vitamin B12 level,  lumbar puncture which showed no evidence of infection, gram stain still pending, patient had oligoclonal bands and IgG index pending other antibodies ordered include neuromyelitis optica and the anti M0G antibodies.  Also MMA levels were ordered.  Patient was initiated on B12 replacement but before then he left AGAINST MEDICAL ADVICE.  Neurology has recommended MRI of the brain which was not done yesterday.  He is back again today and be readmitted.."  Interim: Patient improved enough where he was interested in going home.  Physical therapy recommended outpatient PT and a rolling walker.  Subjective on day of discharge: Patient reports she would rather complete physical therapy at home rather than staying here any longer.  His weakness is slightly improved.  He is eating and drinking normally.  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  He also needs to avoid using illicit substances and drugs.  Discharge Instructions  Discharge Instructions     Ambulatory referral to Occupational Therapy   Complete by: As directed    Call MD for:  extreme fatigue   Complete by: As directed    Call MD for:  persistant dizziness or light-headedness   Complete by: As directed    Diet - low sodium heart healthy   Complete by: As directed    Increase activity slowly   Complete by: As directed  Allergies as of 01/17/2021       Reactions   Abilify [aripiprazole] Anaphylaxis   Pt states that medication gave him involuntary muscle spasms        Medication List     STOP taking these medications    venlafaxine XR 150 MG 24 hr capsule Commonly known as: EFFEXOR-XR        TAKE these medications    albuterol 108 (90 Base) MCG/ACT inhaler Commonly known as: VENTOLIN HFA Inhale 1 puff into the lungs every 6 (six) hours as needed for wheezing or shortness of breath.   amphetamine-dextroamphetamine 30 MG 24 hr capsule Commonly known as: ADDERALL XR Take 30 mg by mouth daily. morning   amphetamine-dextroamphetamine 15 MG tablet Commonly known as: ADDERALL Take 15 mg by mouth daily as needed (for agitation). In the afternoon   cyanocobalamin 2000 MCG tablet Take 1 tablet (2,000 mcg total) by mouth daily. Start taking on: January 18, 2021   escitalopram 10 MG tablet Commonly known as: LEXAPRO Take 10 mg by mouth daily.   lamoTRIgine 200 MG tablet Commonly known as: LAMICTAL Take 200 mg by mouth daily.   LORazepam 2 MG tablet Commonly known as: ATIVAN Take 2 mg by mouth daily as needed for anxiety.   polyvinyl alcohol 1.4 % ophthalmic solution Commonly known as: LIQUIFILM TEARS Place 1 drop into both eyes daily as needed for dry eyes.   Vraylar 1.5 MG capsule Generic drug: cariprazine Take 1.5 mg by mouth daily.               Durable Medical Equipment  (From admission, onward)           Start     Ordered   01/17/21 1357  DME Walker  Once       Question Answer Comment  Walker: With 5 Inch Wheels   Patient needs a walker to treat with the following condition SACD (subacute combined degeneration) (HCC)      01/17/21 1358   01/17/21 1345  For home use only DME Walker rolling  Once       Question Answer Comment  Walker: With 5 Inch Wheels   Patient needs a walker to treat with the following condition Weakness      01/17/21 1345            Follow-up Information     [facility]   Specialty: Rehabilitation Why: The outpatient therapy will contact you for the first appointment Contact information: [address] [id number] [place]  [address] [phone]               Allergies  Allergen Reactions   Abilify [Aripiprazole] Anaphylaxis    Pt states that medication gave him involuntary muscle spasms    Consultations: Neurology   Procedures/Studies: CT Head Wo Contrast  Result Date: 01/13/2021 CLINICAL DATA:  30 year old male with ataxia. EXAM: CT HEAD WITHOUT CONTRAST TECHNIQUE: Contiguous axial images were obtained from the base of the skull through the vertex without intravenous contrast. COMPARISON:  Head CT dated 08/27/2009. FINDINGS: Brain: No evidence of acute infarction, hemorrhage, hydrocephalus, extra-axial collection or mass lesion/mass effect. Vascular: No hyperdense vessel or unexpected calcification. Skull: Normal. Negative for fracture or focal lesion. Sinuses/Orbits: No acute finding. Other: None IMPRESSION: Normal noncontrast CT of the brain. Electronically Signed   By: Elgie Collard M.D.   On: 01/13/2021 22:42   MR BRAIN W WO CONTRAST  Result Date: 01/16/2021 CLINICAL DATA:  Tingling, numbness and weakness EXAM: MRI HEAD WITHOUT AND WITH CONTRAST TECHNIQUE: Multiplanar, multiecho pulse sequences of the brain and surrounding structures were obtained without and with intravenous contrast. CONTRAST:  7mL GADAVIST GADOBUTROL 1 MMOL/ML IV SOLN COMPARISON:  None. FINDINGS: Brain: No acute infarct, mass effect or extra-axial collection. No acute or chronic hemorrhage. Normal white matter signal, parenchymal volume and CSF spaces. The midline structures are normal. There is no abnormal contrast enhancement. Vascular: Major flow voids are preserved. Skull and upper cervical spine: Normal calvarium and skull base. Visualized upper cervical spine and soft tissues are normal. Sinuses/Orbits:No paranasal sinus fluid levels or advanced mucosal thickening. No mastoid or middle ear effusion. Normal orbits. IMPRESSION: Normal brain MRI. Electronically Signed   By: Deatra Robinson M.D.   On: 01/16/2021 01:18   MR  Cervical Spine W or Wo Contrast  Result Date: 01/13/2021 CLINICAL DATA:  Motor neuron disease; worsening weakness and numbness to all extremities with falls EXAM: MRI CERVICAL, THORACIC AND LUMBAR SPINE WITHOUT AND WITH CONTRAST TECHNIQUE: Multiplanar and multiecho pulse sequences of the cervical spine, to include the craniocervical junction and cervicothoracic junction, and thoracic and lumbar spine, were obtained without and with intravenous contrast. CONTRAST:  6.77mL GADAVIST GADOBUTROL 1 MMOL/ML IV SOLN COMPARISON:  None. FINDINGS: MRI CERVICAL SPINE Alignment: No significant listhesis. Vertebrae: Vertebral body heights are maintained. There is no marrow edema. No suspicious osseous lesion. Cord: Possible subtle longitudinally extensive abnormal cord T2 hyperintensity spanning cervical levels and confined to the gray matter. No abnormal intrathecal enhancement. Posterior Fossa, vertebral arteries, paraspinal tissues: Unremarkable. Disc levels: Intervertebral disc heights and signal are maintained. No canal or foraminal stenosis at any level. MRI THORACIC SPINE Alignment:  Preserved. Vertebrae: Vertebral body heights are maintained. There is no marrow edema. No suspicious osseous lesion. Cord:  No abnormal intrathecal enhancement. Paraspinal and other soft tissues: Unremarkable. Disc levels: Intervertebral disc heights and signal are maintained. Trace central disc protrusions, for example at T4-T5. There is no canal or foraminal stenosis at any level. MRI LUMBAR SPINE Segmentation:  Standard. Alignment:  Preserved. Vertebrae: Vertebral body heights are maintained. There is no marrow edema. No suspicious osseous lesion. Conus medullaris and cauda equina: Conus extends to the L1 level. Conus and cauda equina appear normal. No abnormal intrathecal enhancement. Paraspinal and other soft tissues: Unremarkable. Disc levels: Intervertebral disc heights and signal are maintained. There is no canal or foraminal stenosis  at any level. IMPRESSION: Possible subtle longitudinally extensive abnormal cervical cord T2 hyperintensity without enhancement. Assuming this represents a true finding rather than artifact, differential is broad and includes infectious/post infectious and autoimmune etiologies. There are no abnormal flow voids to suggest arteriovenous fistula. Electronically Signed   By: Guadlupe Spanish M.D.   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 CONTRAST:  6.25mL GADAVIST GADOBUTROL 1 MMOL/ML IV SOLN COMPARISON:  None. FINDINGS: MRI CERVICAL SPINE Alignment: No significant listhesis. Vertebrae: Vertebral body heights are maintained. There is no marrow edema. No suspicious osseous lesion. Cord: Possible subtle longitudinally extensive abnormal cord T2 hyperintensity spanning cervical levels and confined to the gray matter. No abnormal intrathecal enhancement. Posterior Fossa,  vertebral arteries, paraspinal tissues: Unremarkable. Disc levels: Intervertebral disc heights and signal are maintained. No canal or foraminal stenosis at any level. MRI THORACIC SPINE Alignment:  Preserved. Vertebrae: Vertebral body heights are maintained. There is no marrow edema. No suspicious osseous lesion. Cord:  No abnormal intrathecal enhancement. Paraspinal and other soft tissues: Unremarkable. Disc levels: Intervertebral disc heights and signal are maintained. Trace central disc protrusions, for example at T4-T5. There is no canal or foraminal stenosis at any level. MRI LUMBAR SPINE Segmentation:  Standard.  Alignment:  Preserved. Vertebrae: Vertebral body heights are maintained. There is no marrow edema. No suspicious osseous lesion. Conus medullaris and cauda equina: Conus extends to the L1 level. Conus and cauda equina appear normal. No abnormal intrathecal enhancement. Paraspinal and other soft tissues: Unremarkable. Disc levels: Intervertebral disc heights and signal are maintained. There is no canal or foraminal stenosis at any level. IMPRESSION: Possible subtle longitudinally extensive abnormal cervical cord T2 hyperintensity without enhancement. Assuming this represents a true finding rather than artifact, differential is broad and includes infectious/post infectious and autoimmune etiologies. There are no abnormal flow voids to suggest arteriovenous fistula. Electronically Signed   By: Guadlupe Spanish M.D.   On: 01/13/2021 20:55   MR Lumbar Spine W Wo Contrast  Result Date: 01/13/2021 CLINICAL DATA:  Motor neuron disease; worsening weakness and numbness to all extremities with falls EXAM: MRI CERVICAL, THORACIC AND LUMBAR SPINE WITHOUT AND WITH CONTRAST TECHNIQUE: Multiplanar and multiecho pulse sequences of the cervical spine, to include the craniocervical junction and cervicothoracic junction, and thoracic and lumbar spine, were obtained without and with intravenous contrast. CONTRAST:  6.14mL GADAVIST GADOBUTROL 1 MMOL/ML IV SOLN COMPARISON:  None. FINDINGS: MRI CERVICAL SPINE Alignment: No significant listhesis. Vertebrae: Vertebral body heights are maintained. There is no marrow edema. No suspicious osseous lesion. Cord: Possible subtle longitudinally extensive abnormal cord T2 hyperintensity spanning cervical levels and confined to the gray matter. No abnormal intrathecal enhancement. Posterior Fossa, vertebral arteries, paraspinal tissues: Unremarkable. Disc levels: Intervertebral disc heights and signal are maintained. No canal or foraminal stenosis at any level. MRI THORACIC SPINE Alignment:   Preserved. Vertebrae: Vertebral body heights are maintained. There is no marrow edema. No suspicious osseous lesion. Cord:  No abnormal intrathecal enhancement. Paraspinal and other soft tissues: Unremarkable. Disc levels: Intervertebral disc heights and signal are maintained. Trace central disc protrusions, for example at T4-T5. There is no canal or foraminal stenosis at any level. MRI LUMBAR SPINE Segmentation:  Standard. Alignment:  Preserved. Vertebrae: Vertebral body heights are maintained. There is no marrow edema. No suspicious osseous lesion. Conus medullaris and cauda equina: Conus extends to the L1 level. Conus and cauda equina appear normal. No abnormal intrathecal enhancement. Paraspinal and other soft tissues: Unremarkable. Disc levels: Intervertebral disc heights and signal are maintained. There is no canal or foraminal stenosis at any level. IMPRESSION: Possible subtle longitudinally extensive abnormal cervical cord T2 hyperintensity without enhancement. Assuming this represents a true finding rather than artifact, differential is broad and includes infectious/post infectious and autoimmune etiologies. There are no abnormal flow voids to suggest arteriovenous fistula. Electronically Signed   By: Guadlupe Spanish M.D.   On: 01/13/2021 20:55      Discharge Exam: Vitals:   01/17/21 0419 01/17/21 1243  BP: (!) 128/112 (!) 120/111  Pulse: 88 82  Resp: 18 18  Temp: 97.7 F (36.5 C) 98.4 F (36.9 C)  SpO2: 100% 100%     General: Pt is awake, not in acute distress Cardiovascular: RRR, S1/S2 +,  no edema Respiratory: CTA bilaterally, no wheezing, no rhonchi, no respiratory distress, no conversational dyspnea  Abdominal: Soft, NT, ND, bowel sounds + Extremities: no edema, no cyanosis Psych: Euthymic mood, flat affect    The results of significant diagnostics from this hospitalization (including imaging, microbiology, ancillary and laboratory) are listed below for reference.      Microbiology: Recent Results (from the past 240 hour(s))  CSF culture w Gram Stain     Status: None   Collection Time: 01/13/21 11:40 PM   Specimen: CSF; Cerebrospinal Fluid  Result Value Ref Range Status   Specimen Description   Final    CSF Performed at [facility], [address] [place], [place] [address]    Special Requests   Final    TUBE 4 Performed at [facility], [address] [place], [place] [address]    Gram Stain   Final    NO WBC SEEN NO ORGANISMS SEEN RESULT CALLED TO, READ BACK BY AND VERIFIED WITH: RN h shepherd at 0040 01/14/21 cruickshank a Performed at [facility], [address], [place], [place] [address]    Culture   Final    NO GROWTH 3 DAYS Performed at [facility], [address] [place], [place] [address]    Report Status 01/17/2021 FINAL  Final  SARS CORONAVIRUS 2 (TAT 6-24 HRS) Nasopharyngeal Nasopharyngeal Swab     Status: None   Collection Time: 01/15/21  4:23 PM   Specimen: Nasopharyngeal Swab  Result Value Ref Range Status   SARS Coronavirus 2 NEGATIVE NEGATIVE Final    Comment: (NOTE) SARS-CoV-2 target nucleic acids are NOT DETECTED.  The SARS-CoV-2 RNA is generally detectable in upper and lower respiratory specimens during the acute phase of infection. Negative results do not preclude SARS-CoV-2 infection, do not rule out co-infections with other pathogens, and should not be used as the sole basis for treatment or other patient management decisions. Negative results must be combined with clinical observations, patient history, and epidemiological information. The expected result is Negative.  Fact Sheet for Patients: HairSlick.no  Fact Sheet for Healthcare Providers: quierodirigir.com  This test is not yet approved or cleared by the [place] FDA and  has been authorized for detection and/or diagnosis of SARS-CoV-2 by FDA under  an Emergency Use Authorization (EUA). This EUA will remain  in effect (meaning this test can be used) for the duration of the COVID-19 declaration under Se ction 564(b)(1) of the Act, 21 U.S.C. section 360bbb-3(b)(1), unless the authorization is terminated or revoked sooner.  Performed at [facility], [address] [place], [place] [address]      Labs: BNP (last 3 results) No results for input(s): BNP in the last 8760 hours. Basic Metabolic Panel: Recent Labs  Lab 01/13/21 1203 01/15/21 1623 01/16/21 0358  NA 138 137 136  K 3.9 3.8 3.6  CL 103 104 103  CO2 GLUCOSE 91 86 103*  BUN CREATININE 0.88 0.96 0.93  CALCIUM 9.2 9.2 9.1  MG 2.2  --   --   PHOS 2.3*  --   --    Liver Function Tests: Recent Labs  Lab 01/13/21 1203 01/13/21 2343 01/16/21 0358  AST 20  --  18  ALT 27  --  21  ALKPHOS 43  --  41  BILITOT 0.4  --  0.7  PROT 7.7  --  5.9*  ALBUMIN 4.5 4.4  3.7   No results for input(s): LIPASE, AMYLASE in the last 168 hours. No results for input(s): AMMONIA in the last 168 hours. CBC: Recent Labs  Lab 01/13/21 1203 01/15/21 1623 01/16/21 0358  WBC 4.3 5.7 5.7  NEUTROABS 2.0 3.1  --   HGB 14.0 14.0 13.4  HCT 40.0 39.1 37.5*  MCV 99.8 99.2 97.9  PLT 268 268 229   Cardiac Enzymes: No results for input(s): CKTOTAL, CKMB, CKMBINDEX, TROPONINI in the last 168 hours. BNP: Invalid input(s): POCBNP CBG: No results for input(s): GLUCAP in the last 168 hours. D-Dimer No results for input(s): DDIMER in the last 72 hours. Hgb A1c No results for input(s): HGBA1C in the last 72 hours. Lipid Profile No results for input(s): CHOL, HDL, LDLCALC, TRIG, CHOLHDL, LDLDIRECT in the last 72 hours. Thyroid function studies No results for input(s): TSH, T4TOTAL, T3FREE, THYROIDAB in the last 72 hours.  Invalid input(s): FREET3 Anemia work up Recent Labs    01/15/21 1740  VITAMINB12 671   Urinalysis    Component Value Date/Time    COLORURINE YELLOW 04/11/2013 0519   APPEARANCEUR CLEAR 04/11/2013 0519   LABSPEC 1.012 04/11/2013 0519   PHURINE 6.0 04/11/2013 0519   GLUCOSEU NEGATIVE 04/11/2013 0519   HGBUR NEGATIVE 04/11/2013 0519   BILIRUBINUR NEGATIVE 04/11/2013 0519   KETONESUR NEGATIVE 04/11/2013 0519   PROTEINUR NEGATIVE 04/11/2013 0519   UROBILINOGEN 0.2 04/11/2013 0519   NITRITE NEGATIVE 04/11/2013 0519   LEUKOCYTESUR NEGATIVE 04/11/2013 0519   Sepsis Labs Invalid input(s): PROCALCITONIN,  WBC,  LACTICIDVEN Microbiology Recent Results (from the past 240 hour(s))  CSF culture w Gram Stain     Status: None   Collection Time: 01/13/21 11:40 PM   Specimen: CSF; Cerebrospinal Fluid  Result Value Ref Range Status   Specimen Description   Final    CSF Performed at [facility], [address] [place], [place] [address]    Special Requests   Final    TUBE 4 Performed at [facility], [address] [place], [place] [address]    Gram Stain   Final    NO WBC SEEN NO ORGANISMS SEEN RESULT CALLED TO, READ BACK BY AND VERIFIED WITH: RN h shepherd at 0040 01/14/21 cruickshank a Performed at [facility], [address] [place], [place] [address]    Culture   Final    NO GROWTH 3 DAYS Performed at [facility], [address] [place], [place] [address]    Report Status 01/17/2021 FINAL  Final  SARS CORONAVIRUS 2 (TAT 6-24 HRS) Nasopharyngeal Nasopharyngeal Swab     Status: None   Collection Time: 01/15/21  4:23 PM   Specimen: Nasopharyngeal Swab  Result Value Ref Range Status   SARS Coronavirus 2 NEGATIVE NEGATIVE Final    Comment: (NOTE) SARS-CoV-2 target nucleic acids are NOT DETECTED.  The SARS-CoV-2 RNA is generally detectable in upper and lower respiratory specimens during the acute phase of infection. Negative results do not preclude SARS-CoV-2 infection, do not rule out co-infections with other pathogens, and should not be used as the sole basis for  treatment or other patient management decisions. Negative results must be combined with clinical observations, patient history, and epidemiological information. The expected result is Negative.  Fact Sheet for Patients: HairSlick.nohttps://www.fda.gov/media/138098/download  Fact Sheet for Healthcare Providers: quierodirigir.comhttps://www.fda.gov/media/138095/download  This test is not yet approved or cleared by the [place] FDA and  has been authorized for detection and/or diagnosis of SARS-CoV-2 by  FDA under an Emergency Use Authorization (EUA). This EUA will remain  in effect (meaning this test can be used) for the duration of the COVID-19 declaration under Se ction 564(b)(1) of the Act, 21 U.S.C. section 360bbb-3(b)(1), unless the authorization is terminated or revoked sooner.  Performed at [facility], [address] [place], [place] [address]      Patient was seen and examined on the day of discharge and was found to be in stable condition. Time coordinating discharge: 35 minutes including assessment and coordination of care, as well as examination of the patient.   SIGNED:  Sharlene Dory, DO [facility] 01/17/2021, 5:05 PM

## 2021-01-17 NOTE — Discharge Instructions (Signed)
You were cared for by a hospitalist during your hospital stay. If you have any questions about your discharge medications or the care you received while you were in the hospital after you are discharged, you can call the unit and ask to speak with the hospitalist on call if the hospitalist that took care of you is not available. Once you are discharged, your primary care physician will handle any further medical issues. Please note that NO REFILLS for any discharge medications will be authorized once you are discharged, as it is imperative that you return to your primary care physician (or establish a relationship with a primary care physician if you do not have one) for your aftercare needs so that they can reassess your need for medications and monitor your lab values.  The Neurology team will reach out to you to follow up.

## 2021-01-20 ENCOUNTER — Ambulatory Visit: Payer: BLUE CROSS/BLUE SHIELD | Admitting: Occupational Therapy

## 2021-01-20 LAB — OLIGOCLONAL BANDS, CSF + SERM

## 2021-01-21 LAB — METHYLMALONIC ACID, SERUM: Methylmalonic Acid, Quantitative: 16761 nmol/L — ABNORMAL HIGH (ref 0–378)

## 2021-01-26 DIAGNOSIS — M2010 Hallux valgus (acquired), unspecified foot: Secondary | ICD-10-CM | POA: Insufficient documentation

## 2021-02-12 LAB — FUNGUS CULTURE WITH STAIN

## 2021-02-12 LAB — FUNGUS CULTURE RESULT

## 2021-02-12 LAB — FUNGAL ORGANISM REFLEX

## 2021-03-23 DIAGNOSIS — Q6651 Congenital pes planus, right foot: Secondary | ICD-10-CM | POA: Insufficient documentation

## 2021-11-12 ENCOUNTER — Encounter (HOSPITAL_COMMUNITY): Payer: Self-pay | Admitting: *Deleted

## 2021-11-12 ENCOUNTER — Other Ambulatory Visit: Payer: Self-pay

## 2021-11-12 ENCOUNTER — Emergency Department (HOSPITAL_COMMUNITY): Payer: BLUE CROSS/BLUE SHIELD

## 2021-11-12 ENCOUNTER — Emergency Department (HOSPITAL_COMMUNITY)
Admission: EM | Admit: 2021-11-12 | Discharge: 2021-11-14 | Disposition: A | Discharge status: Other Acute Inpatient Hospital | Payer: BLUE CROSS/BLUE SHIELD | Attending: Emergency Medicine | Admitting: Emergency Medicine

## 2021-11-12 DIAGNOSIS — Z20822 Contact with and (suspected) exposure to covid-19: Secondary | ICD-10-CM | POA: Insufficient documentation

## 2021-11-12 DIAGNOSIS — S20311A Abrasion of right front wall of thorax, initial encounter: Secondary | ICD-10-CM | POA: Insufficient documentation

## 2021-11-12 DIAGNOSIS — F32A Depression, unspecified: Secondary | ICD-10-CM | POA: Diagnosis present

## 2021-11-12 DIAGNOSIS — X58XXXA Exposure to other specified factors, initial encounter: Secondary | ICD-10-CM | POA: Insufficient documentation

## 2021-11-12 DIAGNOSIS — Y907 Blood alcohol level of 200-239 mg/100 ml: Secondary | ICD-10-CM | POA: Insufficient documentation

## 2021-11-12 DIAGNOSIS — S0081XA Abrasion of other part of head, initial encounter: Secondary | ICD-10-CM | POA: Insufficient documentation

## 2021-11-12 DIAGNOSIS — F10129 Alcohol abuse with intoxication, unspecified: Secondary | ICD-10-CM | POA: Insufficient documentation

## 2021-11-12 DIAGNOSIS — F1019 Alcohol abuse with unspecified alcohol-induced disorder: Secondary | ICD-10-CM | POA: Diagnosis present

## 2021-11-12 DIAGNOSIS — F1410 Cocaine abuse, uncomplicated: Secondary | ICD-10-CM | POA: Diagnosis present

## 2021-11-12 DIAGNOSIS — F10920 Alcohol use, unspecified with intoxication, uncomplicated: Secondary | ICD-10-CM

## 2021-11-12 DIAGNOSIS — F1910 Other psychoactive substance abuse, uncomplicated: Secondary | ICD-10-CM | POA: Diagnosis present

## 2021-11-12 DIAGNOSIS — R45851 Suicidal ideations: Secondary | ICD-10-CM | POA: Insufficient documentation

## 2021-11-12 LAB — CBC WITH DIFFERENTIAL/PLATELET
Abs Immature Granulocytes: 0.01 10*3/uL (ref 0.00–0.07)
Basophils Absolute: 0 10*3/uL (ref 0.0–0.1)
Basophils Relative: 0 %
Eosinophils Absolute: 0.3 10*3/uL (ref 0.0–0.5)
Eosinophils Relative: 5 %
HCT: 42 % (ref 39.0–52.0)
Hemoglobin: 14.8 g/dL (ref 13.0–17.0)
Immature Granulocytes: 0 %
Lymphocytes Relative: 38 %
Lymphs Abs: 2.2 10*3/uL (ref 0.7–4.0)
MCH: 32 pg (ref 26.0–34.0)
MCHC: 35.2 g/dL (ref 30.0–36.0)
MCV: 90.7 fL (ref 80.0–100.0)
Monocytes Absolute: 0.3 10*3/uL (ref 0.1–1.0)
Monocytes Relative: 5 %
Neutro Abs: 3 10*3/uL (ref 1.7–7.7)
Neutrophils Relative %: 52 %
Platelets: 360 10*3/uL (ref 150–400)
RBC: 4.63 MIL/uL (ref 4.22–5.81)
RDW: 11.9 % (ref 11.5–15.5)
WBC: 5.7 10*3/uL (ref 4.0–10.5)
nRBC: 0 % (ref 0.0–0.2)

## 2021-11-12 LAB — COMPREHENSIVE METABOLIC PANEL
ALT: 15 U/L (ref 0–44)
AST: 24 U/L (ref 15–41)
Albumin: 4.4 g/dL (ref 3.5–5.0)
Alkaline Phosphatase: 56 U/L (ref 38–126)
Anion gap: 5 (ref 5–15)
BUN: 16 mg/dL (ref 6–20)
CO2: 27 mmol/L (ref 22–32)
Calcium: 9.2 mg/dL (ref 8.9–10.3)
Chloride: 110 mmol/L (ref 98–111)
Creatinine, Ser: 0.95 mg/dL (ref 0.61–1.24)
GFR, Estimated: >60 mL/min (ref >60)
Glucose, Bld: 98 mg/dL (ref 70–99)
Potassium: 4.2 mmol/L (ref 3.5–5.1)
Sodium: 142 mmol/L (ref 135–145)
Total Bilirubin: 0.3 mg/dL (ref 0.3–1.2)
Total Protein: 7.7 g/dL (ref 6.5–8.1)

## 2021-11-12 LAB — URINALYSIS, ROUTINE W REFLEX MICROSCOPIC
Bacteria, UA: NONE SEEN
Bilirubin Urine: NEGATIVE
Glucose, UA: NEGATIVE mg/dL
Ketones, ur: NEGATIVE mg/dL
Leukocytes,Ua: NEGATIVE
Nitrite: NEGATIVE
Protein, ur: NEGATIVE mg/dL
Specific Gravity, Urine: 1.005 (ref 1.005–1.030)
pH: 7 (ref 5.0–8.0)

## 2021-11-12 LAB — RAPID URINE DRUG SCREEN, HOSP PERFORMED
Amphetamines: NOT DETECTED
Barbiturates: NOT DETECTED
Benzodiazepines: NOT DETECTED
Cocaine: POSITIVE — AB
Opiates: NOT DETECTED
Tetrahydrocannabinol: POSITIVE — AB

## 2021-11-12 LAB — RESP PANEL BY RT-PCR (FLU A&B, COVID) ARPGX2
Influenza A by PCR: NEGATIVE
Influenza B by PCR: NEGATIVE
SARS Coronavirus 2 by RT PCR: NEGATIVE

## 2021-11-12 LAB — ETHANOL: Alcohol, Ethyl (B): 220 mg/dL — ABNORMAL HIGH (ref <10)

## 2021-11-12 LAB — ACETAMINOPHEN LEVEL: Acetaminophen (Tylenol), Serum: <10 ug/mL — ABNORMAL LOW (ref 10–30)

## 2021-11-12 LAB — SALICYLATE LEVEL: Salicylate Lvl: <7 mg/dL — ABNORMAL LOW (ref 7.0–30.0)

## 2021-11-12 IMAGING — DX DG CHEST 1V PORT
1 series · 1 of 1 positions shown · non-contrast
Comparison: None.

CLINICAL DATA: Distress.

EXAM:
PORTABLE CHEST 1 VIEW

[chest ap]
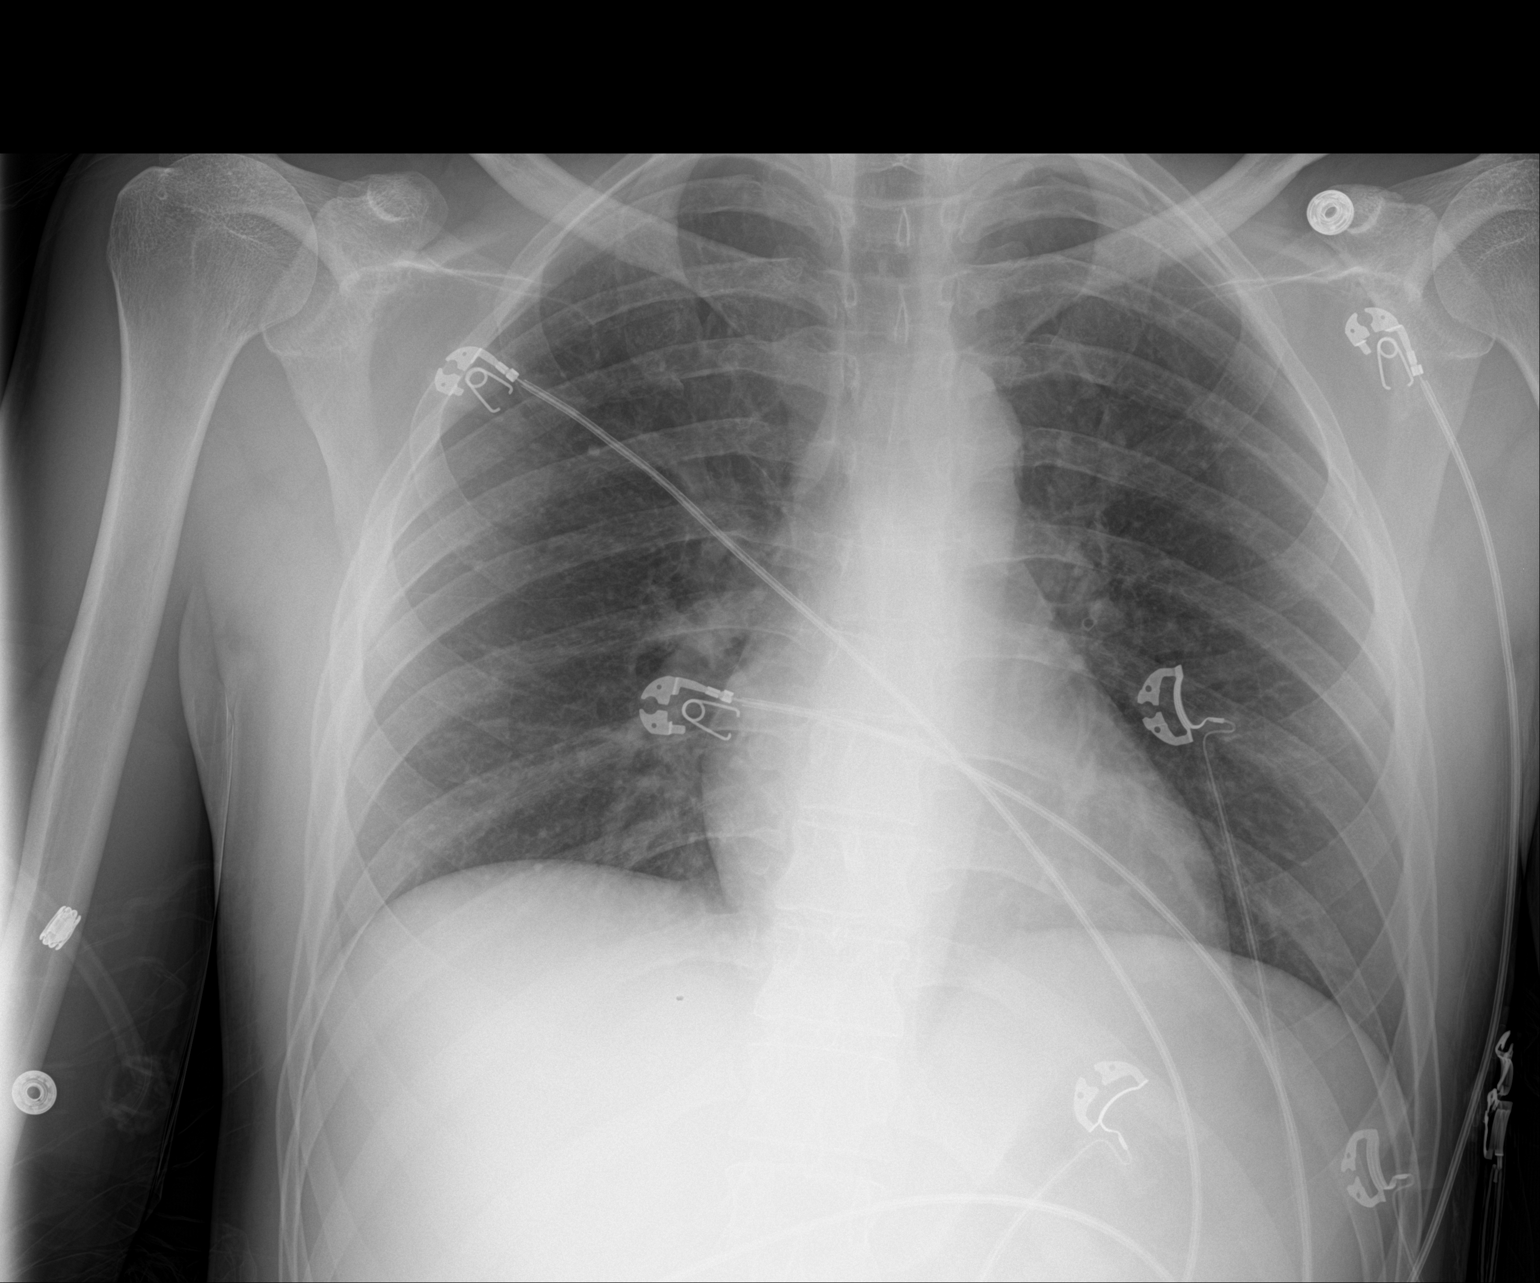

[1 of 1 positions shown; findings below may reference images not displayed]

FINDINGS: The heart size and mediastinal contours are within normal limits.
Both lungs are clear. The visualized skeletal structures are
unremarkable.
IMPRESSION: No active disease.

## 2021-11-12 MED ORDER — THIAMINE HCL 100 MG/ML IJ SOLN: 100.0000 mg | Freq: Every day | INTRAMUSCULAR | Status: DC

## 2021-11-12 MED ORDER — THIAMINE HCL 100 MG PO TABS
100.0000 mg | ORAL_TABLET | Freq: Every day | ORAL | Status: DC
  Administered 2021-11-13 – 2021-11-14 (×2): 100 mg via ORAL
  Filled 2021-11-12 (×2): qty 1

## 2021-11-12 MED ORDER — ZIPRASIDONE MESYLATE 20 MG IM SOLR
20.0000 mg | Freq: Once | INTRAMUSCULAR | Status: AC
  Administered 2021-11-12: 20 mg via INTRAMUSCULAR
  Filled 2021-11-12: qty 20

## 2021-11-12 MED ORDER — LORAZEPAM 2 MG/ML IJ SOLN
2.0000 mg | Freq: Once | INTRAMUSCULAR | Status: AC
  Administered 2021-11-12: 2 mg via INTRAMUSCULAR
  Filled 2021-11-12: qty 1

## 2021-11-12 MED ORDER — NALOXONE HCL 2 MG/2ML IJ SOSY
PREFILLED_SYRINGE | INTRAMUSCULAR | Status: AC
  Filled 2021-11-12: qty 2

## 2021-11-12 MED ORDER — NALOXONE HCL 0.4 MG/ML IJ SOLN
0.4000 mg | Freq: Once | INTRAMUSCULAR | Status: DC
Start: 2021-11-12 — End: 2021-11-14

## 2021-11-12 MED ORDER — STERILE WATER FOR INJECTION IJ SOLN
INTRAMUSCULAR | Status: AC
  Administered 2021-11-12: 10 mL
  Filled 2021-11-12: qty 10
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
## 2021-11-12 NOTE — ED Provider Notes (Signed)
?  Face-to-face evaluation ? ? ?History: Patient presents under involuntary commitment, for statements of suicidality with plan to overdose on opiates.  He has been tearful.  He is under involuntary commitment. ? ?Physical exam: Patient examined by me at 9:35 PM.  At this point he is yelling and screaming and throwing his urinal in the room.  He did not listen to explanation for his reason for being here.  He continued to escalate.  I asked security to restrain him.  Will sedate as well. ? ?MDM: Evaluation for  ?Chief Complaint  ?Patient presents with  ? IVC  ? Alcohol Intoxication  ?  ? ?After while the patient fell asleep and was calm until he began to be aggressive at 9:30 PM.  He required chemical and physical restraint.  Urine drug screen positive for cocaine and THC.  Alcohol level high at 220. ? ?Medical screening examination/treatment/procedure(s) were conducted as a shared visit with non-physician practitioner(s) and myself.  I personally evaluated the patient during the encounter ? ?  ?Mancel Bale, MD ?11/13/21 1249 ? ?

## 2021-11-12 NOTE — ED Notes (Signed)
Pt started on 4 L [place] due to sats 85% RA, pt has slurred speech and drifts to sleep when not stimulated. Pt becomes agitated when PA speaking with him. Keeps stating he is having an anxiety attack.Jumped up demanding to walk around, Deputy at bedside. ?

## 2021-11-12 NOTE — BH Assessment (Signed)
Per Milly Jakob, RN: "Hey patient is too drowsy to due TTS I fear. He was medicated recently and it kicking in."  ? ?Clinician asked RN to message her when the pt is able to engage.  ? ? ?Redmond Pulling, MS, [facility], [facility] ?Triage Specialist ?[phone] ? ?

## 2021-11-12 NOTE — ED Notes (Signed)
Multiple staff members explained to this pt as to why they are IVCed and the pt refuses to listen to anything the staff says.  ?

## 2021-11-12 NOTE — ED Triage Notes (Signed)
BIB [facility] with IVC papers, pt has admitted to ETOH and ? Drugs, pt distressed as he feels he does not need to be here. Crying during triage. ?

## 2021-11-12 NOTE — BH Assessment (Signed)
Clinician messaged Milly Jakob, RN: "Hey. It's Trey with TTS. Is the pt able to engage in the assessment, if so the pt will need to be placed in a private room. Also is the pt under IVC?" Can you fax pt's IVC paperwork to [phone]. Is the pt medically cleared? ? ?Clinician awaiting response.  ? ?Redmond Pulling, MS, [facility], [facility] ?Triage Specialist ?[phone] ? ? ?

## 2021-11-12 NOTE — ED Notes (Signed)
One patient belonging bag was placed in the corresponding cabinet for room 25.  ?

## 2021-11-12 NOTE — ED Notes (Signed)
PT calm resting and in direct view of 1:1 sitter. Bed ina safe low position and vitals monitoring equi[ment attached. PT was sleeping when evaluator from TTS was ready. She says to message them when he is awake ,able and ready to talk. ?

## 2021-11-12 NOTE — ED Provider Notes (Addendum)
?[facility]-EMERGENCY DEPT ?Provider Note ? ? ?CSN: [id number] ?Arrival date & time: 11/12/21  1703 ? ?  ? ?History ? ?Chief Complaint: IVC  ? ?Ruben [name] is a 31 y.o. male brought in today by police department due to [facility] paperwork filed by his significant other.  Paperwork states patient's girlfriend ended the relationship, and in return he threatened to kill himself several times, planned to go buy fentanyl to intentionally overdose, had consumed several Percocet and other prescription medications, and drank a lot of liquor.  Paperwork also states patient chronically consumes significant quantities of alcohol daily.  Patient repeatedly denies suicidal ideation or planning, and states that everything documented in the IVC paperwork is a lie.  Appears clinically intoxicated, with scent of alcohol.  Appears tearful and agitated.  Repetitively refusing to cooperate and wants to leave.  Denies any medical complaint. ? ?The history is provided by the patient, the police and medical records.  ? ?  ? ?Home Medications ?Prior to Admission medications   ?Medication Sig Start Date End Date Taking? Authorizing Provider  ?albuterol (VENTOLIN HFA) 108 (90 Base) MCG/ACT inhaler Inhale 1 puff into the lungs every 6 (six) hours as needed for wheezing or shortness of breath.    [provider]  ?amphetamine-dextroamphetamine (ADDERALL XR) 30 MG 24 hr capsule Take 30 mg by mouth daily. morning 12/22/20   [provider]  ?amphetamine-dextroamphetamine (ADDERALL) 15 MG tablet Take 15 mg by mouth daily as needed (for agitation). In the afternoon 01/08/21   [provider]  ?escitalopram (LEXAPRO) 10 MG tablet Take 10 mg by mouth daily. 01/08/21   [provider]  ?lamoTRIgine (LAMICTAL) 200 MG tablet Take 200 mg by mouth daily. 01/11/21   [provider]  ?LORazepam (ATIVAN) 2 MG tablet Take 2 mg by mouth daily as needed for anxiety. 01/08/21   [provider]   ?polyvinyl alcohol (LIQUIFILM TEARS) 1.4 % ophthalmic solution Place 1 drop into both eyes daily as needed for dry eyes.    [provider]  ?vitamin B-12 2000 MCG tablet Take 1 tablet (2,000 mcg total) by mouth daily. 01/18/21   Sharlene DoryWendling, Javarri Paul, DO  ?VRAYLAR 1.5 MG capsule Take 1.5 mg by mouth daily. 01/08/21   [provider]  ?   ? ?Allergies    ?Abilify [aripiprazole]   ? ?Review of Systems   ?Review of Systems  ?Unable to perform ROS: Other (Pt appears clinically intoxicated and is uncooperative)  ? ?Physical Exam ?Updated Vital Signs ?BP 120/87   Pulse 100   Temp 98.4 ?F (36.9 ?C) (Oral)   Resp 18   SpO2 95%  ?Physical Exam ?Vitals and nursing note reviewed.  ?Constitutional:   ?   General: He is not in acute distress. ?   Appearance: He is well-developed. He is toxic-appearing. He is not ill-appearing.  ?   Comments: Appears clinically intoxicated  ?HENT:  ?   Head: Normocephalic and atraumatic.  ?   Mouth/Throat:  ?   Mouth: Mucous membranes are moist.  ?   Pharynx: Oropharynx is clear.  ?Eyes:  ?   Conjunctiva/sclera: Conjunctivae normal.  ?Cardiovascular:  ?   Rate and Rhythm: Normal rate and regular rhythm.  ?   Pulses: Normal pulses.     ?     Radial pulses are 2+ on the right side and 2+ on the left side.  ?   Heart sounds: Normal heart sounds. No murmur heard. ?Pulmonary:  ?  Effort: Pulmonary effort is normal. No respiratory distress.  ?   Breath sounds: Normal breath sounds. No wheezing.  ?   Comments: Patient occasionally becomes bradypneic on falling asleep coordinating with mild drops in oxygen saturation ?Abdominal:  ?   General: Bowel sounds are normal.  ?   Palpations: Abdomen is soft.  ?   Tenderness: There is no abdominal tenderness.  ?Musculoskeletal:     ?   General: No swelling.  ?   Cervical back: Neck supple.  ?   Right lower leg: No edema.  ?   Left lower leg: No edema.  ?Skin: ?   General: Skin is warm and dry.  ?   Capillary Refill: Capillary refill takes  less than 2 seconds.  ?   Comments: Abrasions noted: 1 on the right cheek, 1 on the right chest ?Without obvious signs of infection  ?Neurological:  ?   Mental Status: He is alert and oriented to person, place, and time.  ?Psychiatric:     ?   Mood and Affect: Mood normal.  ? ? ?ED Results / Procedures / Treatments   ?Labs ?(all labs ordered are listed, but only abnormal results are displayed) ?Labs Reviewed  ?ETHANOL - Abnormal; Notable for the following components:  ?    Result Value  ? Alcohol, Ethyl (B) 220 (*)   ? All other components within normal limits  ?RAPID URINE DRUG SCREEN, HOSP PERFORMED - Abnormal; Notable for the following components:  ? Cocaine POSITIVE (*)   ? Tetrahydrocannabinol POSITIVE (*)   ? All other components within normal limits  ?SALICYLATE LEVEL - Abnormal; Notable for the following components:  ? Salicylate Lvl <7.0 (*)   ? All other components within normal limits  ?ACETAMINOPHEN LEVEL - Abnormal; Notable for the following components:  ? Acetaminophen (Tylenol), Serum <10 (*)   ? All other components within normal limits  ?URINALYSIS, ROUTINE W REFLEX MICROSCOPIC - Abnormal; Notable for the following components:  ? Color, Urine STRAW (*)   ? Hgb urine dipstick MODERATE (*)   ? All other components within normal limits  ?RESP PANEL BY RT-PCR (FLU A&B, COVID) [id number]  ?COMPREHENSIVE METABOLIC PANEL  ?CBC WITH DIFFERENTIAL/PLATELET  ? ? ?EKG ?EKG Interpretation ? ?Date/Time:  Thursday November 12 2021 17:16:28 EDT ?Ventricular Rate:  106 ?PR Interval:  169 ?QRS Duration: 106 ?QT Interval:  352 ?QTC Calculation: 468 ?R Axis:   100 ?Text Interpretation: Sinus tachycardia Right atrial enlargement Borderline right axis deviation ST elev, probable normal early repol pattern When compared with ECG of 02/06/2014, No significant change was found Confirmed by Dione Booze ([id number]) on 11/13/2021 2:57:59 AM ? ?Radiology ?DG Chest Port 1 View ? ?Result Date: 11/12/2021 ?CLINICAL DATA:  Distress. EXAM:  PORTABLE CHEST 1 VIEW COMPARISON:  None. FINDINGS: The heart size and mediastinal contours are within normal limits. Both lungs are clear. The visualized skeletal structures are unremarkable. IMPRESSION: No active disease. Electronically Signed   By: Darliss Cheney M.D.   On: 11/12/2021 18:02   ? ?Procedures ?Procedures  ? ? ?Medications Ordered in ED ?Medications  ?naloxone (NARCAN) injection 0.4 mg (has no administration in time range)  ?naloxone [facility]) 2 MG/2ML injection (  Not Given 11/12/21 1758)  ? ? ?ED Course/ Medical Decision Making/ A&P ?  ?                        ?Medical Decision Making ?Amount and/or Complexity of Data Reviewed ?External Data  Reviewed: labs and notes. ?Labs: ordered. Decision-making details documented in ED Course. ?Radiology: ordered and independent interpretation performed. Decision-making details documented in ED Course. ?ECG/medicine tests: ordered and independent interpretation performed. Decision-making details documented in ED Course. ? ?Risk ?OTC drugs. ?Prescription drug management. ? ? ?31 y.o. male presents to the ED for concern of IVC and Alcohol Intoxication.  This involves an extensive number of treatment options, and is a complaint that carries with it a high risk of complications and morbidity.  The emergent differential diagnosis prior to evaluation includes, but is not limited to: acute psychosis, acute intoxication, electrolyte imbalance, hypoglycemia ? ?This is not an exhaustive differential.  ? ?Past Medical History / Co-morbidities / Social History: ?Alcohol use disorder, cocaine use disorder, suicidal ideation, prior accidental overdose, prior suicide attempts, depression, PTSD, nicotine dependence ? ?Social Determinants of Health include polysubstance use ? ?Additional History:  ?Internal and external records from outside source obtained and reviewed including emergency department, radiology, family medicine visits and notes, as well as police officers and  associated reports ? ?Physical Exam: ?Physical exam performed. The pertinent findings include: toxic appearance.  Lungs CTAB with intermittent decreased oxygen saturation correlating with sleep.  Mild abrasions noted o

## 2021-11-13 DIAGNOSIS — F1019 Alcohol abuse with unspecified alcohol-induced disorder: Secondary | ICD-10-CM | POA: Diagnosis present

## 2021-11-13 DIAGNOSIS — F1410 Cocaine abuse, uncomplicated: Secondary | ICD-10-CM | POA: Diagnosis present

## 2021-11-13 MED ORDER — BUSPIRONE HCL 5 MG PO TABS
7.5000 mg | ORAL_TABLET | Freq: Two times a day (BID) | ORAL | Status: DC
Start: 2021-11-13 — End: 2021-11-14
  Administered 2021-11-13 – 2021-11-14 (×3): 7.5 mg via ORAL
  Filled 2021-11-13 (×5): qty 1.5

## 2021-11-13 MED ORDER — LORAZEPAM 1 MG PO TABS
1.0000 mg | ORAL_TABLET | Freq: Four times a day (QID) | ORAL | Status: DC | PRN
  Administered 2021-11-13 – 2021-11-14 (×6): 1 mg via ORAL
  Filled 2021-11-13 (×6): qty 1

## 2021-11-13 MED ORDER — LORAZEPAM 1 MG PO TABS
1.0000 mg | ORAL_TABLET | Freq: Once | ORAL | Status: AC
  Administered 2021-11-13: 1 mg via ORAL
  Filled 2021-11-13: qty 1

## 2021-11-13 MED ORDER — QUETIAPINE FUMARATE 50 MG PO TABS
50.0000 mg | ORAL_TABLET | Freq: Every morning | ORAL | Status: DC
  Administered 2021-11-13 – 2021-11-14 (×2): 50 mg via ORAL
  Filled 2021-11-13 (×2): qty 1

## 2021-11-13 NOTE — Consult Note (Signed)
[facility] Face-to-Face Psychiatry Consult  ? ?Reason for Consult:  suicidal ideation and planning ?Referring Physician:  Kathie Dike PA-C ?Patient Identification: Ruben [name] ?MRN:  [id number] ?Principal Diagnosis: Alcohol abuse with alcohol-induced disorder (HCC) ?Diagnosis:  Principal Problem: ?  Alcohol abuse with alcohol-induced disorder (HCC) ?Active Problems: ?  Suicidal ideation ?  Polysubstance abuse (HCC) ?  Cocaine abuse (HCC) ? ? ?Total Time spent with patient: 20 minutes ? ?Subjective:   ?Ruben [name] is a 31 y.o. male patient admitted under IVC for SI with plan to overdose on opiates. On assessment patient pt appears drowsy; oriented to self. States he "doesn't know why he is at the hospital. Endorses having "words with someone and woke up with police" surrounding him and being bought to the hospital. Not forthcoming with information; increasingly drowsy due to medications. Patient was placed in restraints due to aggression during the night. UDS+ cocaine, THC, BAL 220. Per chart review patient received Ziprasidone 20 mg IM 11/12/21 2147, Lorazepam 2 mg IM 11/12/21 2146, Lorazepam 1 mg PO 11/13/21 0729, 1107, 1329.  ? ?Patient denies any suicidal or homicidal ideations, auditory or visual hallucinations. Patient presented to [facility] under IVC with UDS+ cocaine, THC; BAL 220. Based on presentation, UDS/BAL findings, patient to remain in ED under continuous assessment and be reassessed by psychiatry in the morning.  ? ?Per chart review patient presented to [facility] in [place]).  ?-09/06/21- [facility]: "This is a 30y/o male with a past  ?medical history of alcohol abuse, asthma, ADHD, seizure, hepatitis, anxiety, depression and bipolar disorder. Patient lives in [place]. He desire to stop drinking and came to [place] for inpatient detox. He entered detox on February 1st and was discharged this morning. He reports that he was treated with Librium and phenobarbital  at the detox. He did not receive any medication today and was not discharged with any prescriptions. After discharge patient reports that he developed symptoms including tremors, feeling anxious and uncomfortable. He complains of chest discomfort. He denies a prior history of alcohol withdrawal. Patient reports that he had a seizure in the past when he abruptly stopped taking Klonopin." Ethanol<10.0 ? ?Collateral:  ?Ian Malkin (petitioner) [phone]: No answer ? ?HPI:  Ruben [name] is a 31 year old male patient with past history of alcohol abuse, hepatitis, anxiety, depression, bipolar, seizures, polysubstance abuse, overdose, admitted to [facility] via IVC. BAL 220, UDS+ cocaine, THC. Per chart review patient recently discharged from detox facility in [place] 09/2021. 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  ? ?Past Psychiatric History: alcohol abuse, hepatitis, anxiety, depression, bipolar, seizures, polysubstance abuse, overdose ? ?Risk to Self:   ?Risk to Others:   ?Prior Inpatient Therapy:   ?Prior Outpatient Therapy:   ? ?Past Medical History:  ?Past Medical History:  ?Diagnosis Date  ? ADHD (attention deficit hyperactivity disorder)   ? Anxiety   ? Depression   ? Suicide attempt [facility])   ?  ?Past Surgical History:  ?Procedure Laterality Date  ? DENTAL SURGERY    ? ?Family History:  ?Family History  ?Problem Relation Age of Onset  ? Hypertension Father   ? Cancer Paternal Grandfather   ? ?Family Psychiatric  History: not noted ?Social History:  ?Social History  ? ?Substance and Sexual Activity  ?Alcohol Use Yes  ?   ?Social History  ? ?Substance and Sexual Activity  ?Drug Use Yes  ? Types: Marijuana  ?  ?Social History  ? ?  Socioeconomic History  ? Marital status: Single  ?  Spouse name: Not on file  ? Number of children: Not on file  ? Years of education: Not on file  ? Highest education level: Not on file  ?Occupational History  ? Not on file  ?Tobacco Use  ?  Smoking status: Former  ?  Packs/day: 1.00  ?  Types: Cigarettes  ? Smokeless tobacco: Never  ?Vaping Use  ? Vaping Use: Every day  ? Substances: Nicotine, Flavoring  ?Substance and Sexual Activity  ? Alcohol use: Yes  ? Drug use: Yes  ?  Types: Marijuana  ? Sexual activity: Not on file  ?Other Topics Concern  ? Not on file  ?Social History Narrative  ? Not on file  ? ?Social Determinants of Health  ? ?Financial Resource Strain: Not on file  ?Food Insecurity: Not on file  ?Transportation Needs: Not on file  ?Physical Activity: Not on file  ?Stress: Not on file  ?Social Connections: Not on file  ? ?Additional Social History: ?  ? ?Allergies:   ?Allergies  ?Allergen Reactions  ? Abilify [Aripiprazole] Anaphylaxis  ?  Pt states that medication gave him involuntary muscle spasms  ? ? ?Labs:  ?Results for orders placed or performed during the hospital encounter of 11/12/21 (from the past 48 hour(s))  ?Urine rapid drug screen (hosp performed)     Status: Abnormal  ? Collection Time: 11/12/21  5:29 PM  ?Result Value Ref Range  ? Opiates NONE DETECTED NONE DETECTED  ? Cocaine POSITIVE (A) NONE DETECTED  ? Benzodiazepines NONE DETECTED NONE DETECTED  ? Amphetamines NONE DETECTED NONE DETECTED  ? Tetrahydrocannabinol POSITIVE (A) NONE DETECTED  ? Barbiturates NONE DETECTED NONE DETECTED  ?  Comment: (NOTE) ?DRUG SCREEN FOR MEDICAL PURPOSES ?ONLY.  IF CONFIRMATION IS NEEDED ?FOR ANY PURPOSE, NOTIFY LAB ?WITHIN 5 DAYS. ? ?LOWEST DETECTABLE LIMITS ?FOR URINE DRUG SCREEN ?Drug Class                     Cutoff (ng/mL) ?Amphetamine and metabolites    1000 ?Barbiturate and metabolites    200 ?Benzodiazepine                 200 ?Tricyclics and metabolites     300 ?Opiates and metabolites        300 ?Cocaine and metabolites        300 ?THC                            50 ?Performed at [facility], [address] ?[place], [place] [address] ?  ?Urinalysis, Routine w reflex microscopic Urine, Clean Catch     Status:  Abnormal  ? Collection Time: 11/12/21  5:30 PM  ?Result Value Ref Range  ? Color, Urine STRAW (A) YELLOW  ? APPearance CLEAR CLEAR  ? Specific Gravity, Urine 1.005 1.005 - 1.030  ? pH 7.0 5.0 - 8.0  ? Glucose, UA NEGATIVE NEGATIVE mg/dL  ? Hgb urine dipstick MODERATE (A) NEGATIVE  ? Bilirubin Urine NEGATIVE NEGATIVE  ? Ketones, ur NEGATIVE NEGATIVE mg/dL  ? Protein, ur NEGATIVE NEGATIVE mg/dL  ? Nitrite NEGATIVE NEGATIVE  ? Leukocytes,Ua NEGATIVE NEGATIVE  ? RBC / HPF 0-5 0 - 5 RBC/hpf  ? WBC, UA 0-5 0 - 5 WBC/hpf  ? Bacteria, UA NONE SEEN NONE SEEN  ? Squamous Epithelial / LPF 0-5 0 - 5  ?  Comment: Performed  at [facility], [address] [place], [place] [id number]  ?Resp Panel by RT-PCR (Flu A&B, Covid) Nasopharyngeal Swab     Status: None  ? Collection Time: 11/12/21  6:06 PM  ? Specimen: Nasopharyngeal Swab; Nasopharyngeal(NP) swabs in vial transport medium  ?Result Value Ref Range  ? SARS Coronavirus 2 by RT PCR NEGATIVE NEGATIVE  ?  Comment: (NOTE) ?SARS-CoV-2 target nucleic acids are NOT DETECTED. ? ?The SARS-CoV-2 RNA is generally detectable in upper respiratory ?specimens during the acute phase of infection. The lowest ?concentration of SARS-CoV-2 viral copies this assay can detect is ?138 copies/mL. A negative result does not preclude SARS-Cov-2 ?infection and should not be used as the sole basis for treatment or ?other patient management decisions. A negative result may occur with  ?improper specimen collection/handling, submission of specimen other ?than nasopharyngeal swab, presence of viral mutation(s) within the ?areas targeted by this assay, and inadequate number of viral ?copies(<138 copies/mL). A negative result must be combined with ?clinical observations, patient history, and epidemiological ?information. The expected result is Negative. ? ?Fact Sheet for Patients:  ?BloggerCourse.comhttps://www.fda.gov/media/152166/download ? ?Fact Sheet for Healthcare Providers:   ?SeriousBroker.ithttps://www.fda.gov/media/152162/download ? ?This test is no t yet approved or cleared by the [place] FDA and  ?has been authorized for detection and/or diagnosis of SARS-CoV-2 by ?FDA under an Emergency Use Authorization (EUA

## 2021-11-13 NOTE — ED Notes (Signed)
Pt given ativan. Pt made aware that he will not be discharging today. Pt very upset because he doesnt want to stay. Pt made aware that because he was suicidal with a plan he was going to have to be watched another day. Pt states he is not suicidal today and never had a plan. Pt states that he didn't have a plan but because he didn't have access to any of the drugs he wanted to take. Pt again informed that that was a plan and that he would be here at least until tomorrow when he is reevaluated. Pt requesting to use the telephone. Phone provided at this time. Pt called girlfriend and instantly started asking her if she really broke up with him or if she was breaking up with him. Pt told that he would only be permitted to use the phone given he didn't get upset with whomever he was speaking to.  ?

## 2021-11-13 NOTE — ED Notes (Signed)
Patient to room 31. Patient ambulatory to room.  Patient oriented to unit and room.  Patient upset and tearful. Patient resting at this time. ?

## 2021-11-13 NOTE — ED Notes (Signed)
PT woke up to urinate. Began talking aggressively to staff right away. Got out of bed and used urinal in the corner of the room. Climbed back in bed, and fell back asleep ? ?

## 2021-11-14 ENCOUNTER — Other Ambulatory Visit: Payer: Self-pay | Admitting: Psychiatry

## 2021-11-14 ENCOUNTER — Inpatient Hospital Stay (HOSPITAL_COMMUNITY)
Admission: AD | Admit: 2021-11-14 | Discharge: 2021-11-18 | DRG: 885 | Disposition: A | Discharge status: Home / Self Care | Payer: Federal, State, Local not specified - Other | Source: Intra-hospital | Attending: Psychiatry | Admitting: Psychiatry

## 2021-11-14 DIAGNOSIS — R197 Diarrhea, unspecified: Secondary | ICD-10-CM | POA: Diagnosis present

## 2021-11-14 DIAGNOSIS — F10139 Alcohol abuse with withdrawal, unspecified: Secondary | ICD-10-CM | POA: Diagnosis present

## 2021-11-14 DIAGNOSIS — F4310 Post-traumatic stress disorder, unspecified: Secondary | ICD-10-CM | POA: Diagnosis present

## 2021-11-14 DIAGNOSIS — F332 Major depressive disorder, recurrent severe without psychotic features: Secondary | ICD-10-CM | POA: Diagnosis present

## 2021-11-14 DIAGNOSIS — K30 Functional dyspepsia: Secondary | ICD-10-CM | POA: Diagnosis present

## 2021-11-14 DIAGNOSIS — Z79899 Other long term (current) drug therapy: Secondary | ICD-10-CM

## 2021-11-14 DIAGNOSIS — K5900 Constipation, unspecified: Secondary | ICD-10-CM | POA: Diagnosis present

## 2021-11-14 DIAGNOSIS — J45909 Unspecified asthma, uncomplicated: Secondary | ICD-10-CM | POA: Diagnosis present

## 2021-11-14 DIAGNOSIS — F17200 Nicotine dependence, unspecified, uncomplicated: Secondary | ICD-10-CM | POA: Diagnosis present

## 2021-11-14 DIAGNOSIS — F1910 Other psychoactive substance abuse, uncomplicated: Secondary | ICD-10-CM | POA: Diagnosis present

## 2021-11-14 DIAGNOSIS — R45851 Suicidal ideations: Secondary | ICD-10-CM

## 2021-11-14 DIAGNOSIS — R569 Unspecified convulsions: Secondary | ICD-10-CM | POA: Diagnosis present

## 2021-11-14 DIAGNOSIS — F909 Attention-deficit hyperactivity disorder, unspecified type: Secondary | ICD-10-CM | POA: Diagnosis present

## 2021-11-14 DIAGNOSIS — G4700 Insomnia, unspecified: Secondary | ICD-10-CM | POA: Diagnosis present

## 2021-11-14 DIAGNOSIS — F1019 Alcohol abuse with unspecified alcohol-induced disorder: Secondary | ICD-10-CM | POA: Diagnosis present

## 2021-11-14 DIAGNOSIS — F411 Generalized anxiety disorder: Secondary | ICD-10-CM | POA: Diagnosis present

## 2021-11-14 DIAGNOSIS — F1410 Cocaine abuse, uncomplicated: Secondary | ICD-10-CM | POA: Diagnosis present

## 2021-11-14 DIAGNOSIS — Z87891 Personal history of nicotine dependence: Secondary | ICD-10-CM | POA: Diagnosis not present

## 2021-11-14 MED ORDER — HYDROXYZINE HCL 25 MG PO TABS
25.0000 mg | ORAL_TABLET | Freq: Four times a day (QID) | ORAL | Status: DC | PRN
  Administered 2021-11-14 – 2021-11-16 (×4): 25 mg via ORAL
  Filled 2021-11-14 (×5): qty 1

## 2021-11-14 MED ORDER — ZIPRASIDONE MESYLATE 20 MG IM SOLR: 20.0000 mg | INTRAMUSCULAR | Status: DC | PRN

## 2021-11-14 MED ORDER — ALBUTEROL SULFATE HFA 108 (90 BASE) MCG/ACT IN AERS: 1.0000 | INHALATION_SPRAY | Freq: Four times a day (QID) | RESPIRATORY_TRACT | Status: DC | PRN

## 2021-11-14 MED ORDER — THIAMINE HCL 100 MG PO TABS
100.0000 mg | ORAL_TABLET | Freq: Every day | ORAL | Status: DC
  Administered 2021-11-15 – 2021-11-18 (×4): 100 mg via ORAL
  Filled 2021-11-14: qty 1
  Filled 2021-11-14: qty 7
  Filled 2021-11-14 (×4): qty 1
  Filled 2021-11-14: qty 7
  Filled 2021-11-14 (×2): qty 1

## 2021-11-14 MED ORDER — QUETIAPINE FUMARATE 100 MG PO TABS
ORAL_TABLET | ORAL | Status: AC
  Filled 2021-11-14: qty 1

## 2021-11-14 MED ORDER — NICOTINE 21 MG/24HR TD PT24
21.0000 mg | MEDICATED_PATCH | Freq: Every day | TRANSDERMAL | Status: DC
  Administered 2021-11-14 – 2021-11-15 (×2): 21 mg via TRANSDERMAL
  Filled 2021-11-14 (×4): qty 1

## 2021-11-14 MED ORDER — ADULT MULTIVITAMIN W/MINERALS CH
1.0000 | ORAL_TABLET | Freq: Every day | ORAL | Status: DC
  Administered 2021-11-15 – 2021-11-18 (×4): 1 via ORAL
  Filled 2021-11-14: qty 7
  Filled 2021-11-14 (×2): qty 1
  Filled 2021-11-14: qty 7
  Filled 2021-11-14 (×5): qty 1

## 2021-11-14 MED ORDER — CHLORDIAZEPOXIDE HCL 25 MG PO CAPS
25.0000 mg | ORAL_CAPSULE | Freq: Four times a day (QID) | ORAL | Status: DC | PRN
  Administered 2021-11-14: 25 mg via ORAL
  Filled 2021-11-14: qty 1

## 2021-11-14 MED ORDER — CHLORDIAZEPOXIDE HCL 25 MG PO CAPS
25.0000 mg | ORAL_CAPSULE | Freq: Four times a day (QID) | ORAL | Status: AC
  Administered 2021-11-14 – 2021-11-15 (×6): 25 mg via ORAL
  Filled 2021-11-14 (×6): qty 1

## 2021-11-14 MED ORDER — LORAZEPAM 1 MG PO TABS
1.0000 mg | ORAL_TABLET | ORAL | Status: AC | PRN
  Administered 2021-11-14: 1 mg via ORAL
  Filled 2021-11-14: qty 1

## 2021-11-14 MED ORDER — CHLORDIAZEPOXIDE HCL 25 MG PO CAPS
25.0000 mg | ORAL_CAPSULE | Freq: Three times a day (TID) | ORAL | Status: AC
  Administered 2021-11-16 (×3): 25 mg via ORAL
  Filled 2021-11-14 (×3): qty 1

## 2021-11-14 MED ORDER — QUETIAPINE FUMARATE 100 MG PO TABS: 100.0000 mg | ORAL_TABLET | Freq: Every evening | ORAL | Status: DC | PRN

## 2021-11-14 MED ORDER — OLANZAPINE 10 MG PO TBDP
10.0000 mg | ORAL_TABLET | Freq: Three times a day (TID) | ORAL | Status: DC | PRN
  Administered 2021-11-15 – 2021-11-16 (×2): 10 mg via ORAL
  Filled 2021-11-14 (×2): qty 1

## 2021-11-14 MED ORDER — QUETIAPINE FUMARATE 100 MG PO TABS: 100.0000 mg | ORAL_TABLET | Freq: Once | ORAL | Status: DC

## 2021-11-14 MED ORDER — QUETIAPINE FUMARATE 100 MG PO TABS
100.0000 mg | ORAL_TABLET | Freq: Every evening | ORAL | Status: AC | PRN
  Administered 2021-11-14 (×2): 100 mg via ORAL
  Filled 2021-11-14 (×2): qty 1

## 2021-11-14 MED ORDER — CHLORDIAZEPOXIDE HCL 25 MG PO CAPS
25.0000 mg | ORAL_CAPSULE | ORAL | Status: AC
  Administered 2021-11-17: 25 mg via ORAL
  Filled 2021-11-14 (×2): qty 1

## 2021-11-14 MED ORDER — CHLORDIAZEPOXIDE HCL 25 MG PO CAPS: 25.0000 mg | ORAL_CAPSULE | Freq: Every day | ORAL | Status: DC

## 2021-11-14 MED ORDER — ONDANSETRON 4 MG PO TBDP
4.0000 mg | ORAL_TABLET | Freq: Four times a day (QID) | ORAL | Status: AC | PRN
  Administered 2021-11-14 – 2021-11-15 (×3): 4 mg via ORAL
  Filled 2021-11-14 (×3): qty 1

## 2021-11-14 MED ORDER — THIAMINE HCL 100 MG/ML IJ SOLN: 100.0000 mg | Freq: Once | INTRAMUSCULAR | Status: DC

## 2021-11-14 MED ORDER — LOPERAMIDE HCL 2 MG PO CAPS
2.0000 mg | ORAL_CAPSULE | ORAL | Status: AC | PRN
  Administered 2021-11-14 – 2021-11-16 (×5): 2 mg via ORAL
  Administered 2021-11-16: 4 mg via ORAL
  Administered 2021-11-17: 2 mg via ORAL
  Filled 2021-11-14: qty 1
  Filled 2021-11-14 (×2): qty 2
  Filled 2021-11-14 (×2): qty 1
  Filled 2021-11-14: qty 2
  Filled 2021-11-14 (×3): qty 1

## 2021-11-14 NOTE — ED Provider Notes (Signed)
Emergency Medicine Observation Re-evaluation Note ? ?Ruben [name] is a 31 y.o. male, seen on rounds today at 0700.  Pt initially presented to the ED for complaints of IVC and Alcohol Intoxication ?Currently, the patient is resting comfortably. ? ?Physical Exam  ?BP 120/80 (BP Location: Right Arm)   Pulse 64   Temp 98.6 ?F (37 ?C) (Oral)   Resp 18   SpO2 99%  ?Physical Exam ?General: NAD ? ? ?ED Course / MDM  ?EKG:EKG Interpretation ? ?Date/Time:  Thursday November 12 2021 17:16:28 EDT ?Ventricular Rate:  106 ?PR Interval:  169 ?QRS Duration: 106 ?QT Interval:  352 ?QTC Calculation: 468 ?R Axis:   100 ?Text Interpretation: Sinus tachycardia Right atrial enlargement Borderline right axis deviation ST elev, probable normal early repol pattern When compared with ECG of 02/06/2014, No significant change was found Confirmed by Dione Booze ([id number]) on 11/13/2021 2:57:59 AM ? ?I have reviewed the labs performed to date as well as medications administered while in observation.  Recent changes in the last 24 hours include no acute events reported. ? ?Plan  ?Current plan is for psych re-evaluation and possible placement. ?Lyon Dumont Fredman is under involuntary commitment. ?  ? ?  ?Wynetta Fines, MD ?11/14/21 [phone] ? ?

## 2021-11-14 NOTE — Progress Notes (Signed)
Per Rosey Bath, [facility], pt has been accepted to [facility], 304-2. Accepting provider is Leroy Sea, Attending provider is Dr. Octavia Bruckner. Patient can arrive by 3:00pm. Number for report is [phone]. ? ? ?Crissie Reese, MSW, LCSW-A ?Phone: [phone] ?Disposition/TOC ? ? ?
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
## 2021-11-14 NOTE — ED Notes (Signed)
Patient alert this shift. Cooperative with care. Irritable and labile at times. No aggression noted. Medication compliant.  Ambulatory.   ?

## 2021-11-14 NOTE — Progress Notes (Signed)
Adult Psychoeducational Group Note ? ?Date:  11/14/2021 ?Time:  8:48 PM ? ?Group Topic/Focus:  ?Wrap-Up Group:   The focus of this group is to help patients review their daily goal of treatment and discuss progress on daily workbooks. ? ?Participation Level:  Minimal ? ?Participation Quality:  Appropriate ? ?Affect:  Depressed and Flat ? ?Cognitive:  Appropriate ? ?Insight: Limited ? ?Engagement in Group:  Limited ? ?Modes of Intervention:  Discussion ? ?Additional Comments:  Pt stated his goal for today was to focus on his treatment plan. Pt stated he accomplished his goal today. Pt stated he did not talked with his doctor or with his social worker about his care today. Pt rated his overall day a 5 out of 10. Pt stated he was able to contact his girlfriend today which improved his overall day. Pt stated he felt better about himself today. Pt stated he was able to attend all meals. Pt stated he took all medications provided today. Pt stated he attend all groups held today. Pt stated his appetite was pretty good today. Pt rated sleep last night was poor. Pt stated the goal tonight was to get some rest. Pt stated he had some physical pain tonight. Pt stated he has some moderate pain in his back and chest tonight. Pt rated the moderate pain a 5 on the pain level scale. Pt nurse was updated on the situation. Pt deny visual hallucinations and auditory issues tonight. Pt denies thoughts of harming himself or others. Pt stated he would alert staff if anything changed ? ?Ruben [name] ?11/14/2021, 8:48 PM ?

## 2021-11-14 NOTE — Consult Note (Signed)
Telepsych Consultation  ? ?Reason for Consult:  SI ?Referring Physician:  Kathie DikeAlicia Cockerham PA-C ?[id number] ?Location of Provider: [facility]TS Department ? ?Patient Identification: Ruben [name] ?MRN:  [id number] ?Principal Diagnosis: Suicidal ideation ?Diagnosis:  Principal Problem: ?  Suicidal ideation ?Active Problems: ?  Polysubstance abuse (HCC) ?  Alcohol abuse with alcohol-induced disorder (HCC) ?  Cocaine abuse (HCC) ?  Depressive disorder ? ? ?Total Time spent with patient: 20 minutes ? ?Subjective:   ?Ruben [name] is a 31 y.o. male patient admitted with suicidal ideations. ?Patient initially presented stating, "I don't know why I'm here. I don't want to hurt myself or nobody. I never said that. I just drink a lot". Provider explained to patient that petitioner had been contacted and provided a different story, patient presentation changed and he stated, "My girlfriend said something to me and I got very upset and tried to find some alcohol and medication to take". He admitted to making suicidal threats and having plan; refused to answer regarding intent. He denies any homicidal thoughts, auditory or visual hallucinations at this time. Provider discussed risk factors and safety concerns, explained the need to uphold IVC and seek inpatient hospitalization after obtaining information from petitioner; further explained process. [facility].  ? ?Per chart review:  ? -EDRN note 11/13/21 1333: "Pt given ativan. Pt made aware that he will not be discharging today. Pt very upset because he doesnt want to stay. Pt made aware that because he was suicidal with a plan he was going to have to be watched another day. Pt states he is not suicidal today and never had a plan. Pt states that he didn't have a plan but because he didn't have access to any of the drugs he wanted to take. Pt again informed that that was a plan and that he would be here at least until tomorrow when he  is reevaluated. Pt requesting to use the telephone. Phone provided at this time. Pt called girlfriend and instantly started asking her if she really broke up with him or if she was breaking up with him. Pt told that he would only be permitted to use the phone given he didn't get upset with whomever he was speaking to." ? ?Collateral: Reynolds BowlKelsey Meissner (petitioner/girlfriend) [phone] ?"This is a regular thing. He has a lot of mental health struggles and substance use. 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 I feel he is a threat to himself"; has attempted in the past using. States she did break up with him and sent "I'm killing myself fuck you. This is how I feel, I want to die. How could you do this to me. I'm on my way to get Fentanyl and it won't matter in 30 minutes". Patient has contacted her once since ED admission where patient was concerned and repeatedly asking, "Are you really going to leave me? Are you really going to break up with me?". Petitioner states patient is a threat to himself and unable to contract for safety.  ? ?HPI:  Ruben [name] is a 31 year old male patient with past history of alcohol abuse, hepatitis, anxiety, depression, bipolar, seizures, polysubstance abuse, overdose, [facility] girlfriend after making suicidal statements with plan following breakup. BAL 220, UDS+ cocaine, THC. Per chart review patient recently discharged from detox facility in [place] 09[place]2021. 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 UDS+cocaine, THC; BAL 220. PDMP reviewed, last prescription (Ketamine Hcl powder 09/21/21).  ? ?Past Psychiatric History: alcohol abuse, hepatitis, anxiety, depression, bipolar, seizures, polysubstance abuse, overdose ? ?Risk to Self:  yes ?Risk to Others:   ?Prior Inpatient Therapy:  yes ?Prior Outpatient Therapy:  yes ? ?Past Medical History:  ?Past Medical  History:  ?Diagnosis Date  ? ADHD (attention deficit hyperactivity disorder)   ? Anxiety   ? Depression   ? Suicide attempt [facility])   ?  ?Past Surgical History:  ?Procedure Laterality Date  ? DENTAL SURGERY    ? ?Family History:  ?Family History  ?Problem Relation Age of Onset  ? Hypertension Father   ? Cancer Paternal Grandfather   ? ?Family Psychiatric  History: not noted ?Social History:  ?Social History  ? ?Substance and Sexual Activity  ?Alcohol Use Yes  ?   ?Social History  ? ?Substance and Sexual Activity  ?Drug Use Yes  ? Types: Marijuana  ?  ?Social History  ? ?Socioeconomic History  ? Marital status: Single  ?  Spouse name: Not on file  ? Number of children: Not on file  ? Years of education: Not on file  ? Highest education level: Not on file  ?Occupational History  ? Not on file  ?Tobacco Use  ? Smoking status: Former  ?  Packs/day: 1.00  ?  Types: Cigarettes  ? Smokeless tobacco: Never  ?Vaping Use  ? Vaping Use: Every day  ? Substances: Nicotine, Flavoring  ?Substance and Sexual Activity  ? Alcohol use: Yes  ? Drug use: Yes  ?  Types: Marijuana  ? Sexual activity: Not on file  ?Other Topics Concern  ? Not on file  ?Social History Narrative  ? Not on file  ? ?Social Determinants of Health  ? ?Financial Resource Strain: Not on file  ?Food Insecurity: Not on file  ?Transportation Needs: Not on file  ?Physical Activity: Not on file  ?Stress: Not on file  ?Social Connections: Not on file  ? ?Additional Social History: ?  ? ?Allergies:   ?Allergies  ?Allergen Reactions  ? Abilify [Aripiprazole] Anaphylaxis  ?  Pt states that medication gave him involuntary muscle spasms  ? ? ?Labs:  ?Results for orders placed or performed during the hospital encounter of 11/12/21 (from the past 48 hour(s))  ?Urine rapid drug screen (hosp performed)     Status: Abnormal  ? Collection Time: 11/12/21  5:29 PM  ?Result Value Ref Range  ? Opiates NONE DETECTED NONE DETECTED  ? Cocaine POSITIVE (A) NONE DETECTED  ? Benzodiazepines  NONE DETECTED NONE DETECTED  ? Amphetamines NONE DETECTED NONE DETECTED  ? Tetrahydrocannabinol POSITIVE (A) NONE DETECTED  ? Barbiturates NONE DETECTED NONE DETECTED  ?  Comment: (NOTE) ?DRUG SCREEN FOR MEDICAL PURPOSES ?ONLY.  IF CONFIRMATION IS NEEDED ?FOR ANY PURPOSE, NOTIFY LAB ?WITHIN 5 DAYS. ? ?LOWEST DETECTABLE LIMITS ?FOR URINE DRUG SCREEN ?Drug Class                     Cutoff (ng/mL) ?Amphetamine and metabolites    1000 ?Barbiturate and metabolites    200 ?Benzodiazepine                 200 ?Tricyclics and metabolites     300 ?Opiates and metabolites        300 ?Cocaine and metabolites        300 ?THC  50 ?Performed at [facility], [address] ?[place], [place] [address] ?  ?Urinalysis, Routine w reflex microscopic Urine, Clean Catch     Status: Abnormal  ? Collection Time: 11/12/21  5:30 PM  ?Result Value Ref Range  ? Color, Urine STRAW (A) YELLOW  ? APPearance CLEAR CLEAR  ? Specific Gravity, Urine 1.005 1.005 - 1.030  ? pH 7.0 5.0 - 8.0  ? Glucose, UA NEGATIVE NEGATIVE mg/dL  ? Hgb urine dipstick MODERATE (A) NEGATIVE  ? Bilirubin Urine NEGATIVE NEGATIVE  ? Ketones, ur NEGATIVE NEGATIVE mg/dL  ? Protein, ur NEGATIVE NEGATIVE mg/dL  ? Nitrite NEGATIVE NEGATIVE  ? Leukocytes,Ua NEGATIVE NEGATIVE  ? RBC / HPF 0-5 0 - 5 RBC/hpf  ? WBC, UA 0-5 0 - 5 WBC/hpf  ? Bacteria, UA NONE SEEN NONE SEEN  ? Squamous Epithelial / LPF 0-5 0 - 5  ?  Comment: Performed at [facility], [address], [place], [place] [address]  ?Resp Panel by RT-PCR (Flu A&B, Covid) Nasopharyngeal Swab     Status: None  ? Collection Time: 11/12/21  6:06 PM  ? Specimen: Nasopharyngeal Swab; Nasopharyngeal(NP) swabs in vial transport medium  ?Result Value Ref Range  ? SARS Coronavirus 2 by RT PCR NEGATIVE NEGATIVE  ?  Comment: (NOTE) ?SARS-CoV-2 target nucleic acids are NOT DETECTED. ? ?The SARS-CoV-2 RNA is generally detectable in upper respiratory ?specimens during the  acute phase of infection. The lowest ?concentration of SARS-CoV-2 viral copies this assay can detect is ?138 copies/mL. A negative result does not preclude SARS-Cov-2 ?infection and should not be used as the

## 2021-11-14 NOTE — ED Notes (Signed)
Patient DC d off unit to [facility].  Patient alert, cooperative, no s/s of distress. DC information and belongings given to [employer].  Patient ambulatory off unit, escorted and transported [employer].  ?

## 2021-11-15 MED ORDER — LORAZEPAM 2 MG/ML IJ SOLN
2.0000 mg | INTRAMUSCULAR | Status: DC | PRN
Start: 2021-11-15 — End: 2021-11-18

## 2021-11-15 MED ORDER — POLYETHYLENE GLYCOL 3350 17 G PO PACK
17.0000 g | PACK | Freq: Every day | ORAL | Status: DC
  Filled 2021-11-15 (×3): qty 1

## 2021-11-15 MED ORDER — LORAZEPAM 2 MG/ML IJ SOLN: 1.0000 mg | Freq: Every day | INTRAMUSCULAR | Status: AC

## 2021-11-15 MED ORDER — SERTRALINE HCL 50 MG PO TABS
50.0000 mg | ORAL_TABLET | Freq: Once | ORAL | Status: AC
  Administered 2021-11-15: 50 mg via ORAL
  Filled 2021-11-15 (×2): qty 1

## 2021-11-15 MED ORDER — SERTRALINE HCL 100 MG PO TABS
100.0000 mg | ORAL_TABLET | Freq: Every day | ORAL | Status: DC
Start: 2021-11-16 — End: 2021-11-18
  Administered 2021-11-16 – 2021-11-18 (×3): 100 mg via ORAL
  Filled 2021-11-15: qty 7
  Filled 2021-11-15 (×2): qty 1
  Filled 2021-11-15: qty 7
  Filled 2021-11-15 (×2): qty 1

## 2021-11-15 MED ORDER — ACETAMINOPHEN 325 MG PO TABS
650.0000 mg | ORAL_TABLET | Freq: Four times a day (QID) | ORAL | Status: DC | PRN
Start: 2021-11-15 — End: 2021-11-18

## 2021-11-15 MED ORDER — LORAZEPAM 1 MG PO TABS
1.0000 mg | ORAL_TABLET | Freq: Every day | ORAL | Status: AC
Start: 2021-11-15 — End: 2021-11-15
  Administered 2021-11-15: 1 mg via ORAL
  Filled 2021-11-15: qty 1

## 2021-11-15 MED ORDER — NICOTINE POLACRILEX 2 MG MT GUM
2.0000 mg | CHEWING_GUM | OROMUCOSAL | Status: DC | PRN
  Administered 2021-11-15 – 2021-11-18 (×8): 2 mg via ORAL
  Filled 2021-11-15 (×7): qty 1

## 2021-11-15 MED ORDER — LORAZEPAM 1 MG PO TABS
1.0000 mg | ORAL_TABLET | Freq: Four times a day (QID) | ORAL | Status: AC | PRN
  Administered 2021-11-15: 1 mg via ORAL
  Filled 2021-11-15: qty 1

## 2021-11-15 MED ORDER — GABAPENTIN 300 MG PO CAPS
300.0000 mg | ORAL_CAPSULE | Freq: Every day | ORAL | Status: DC
Start: 2021-11-15 — End: 2021-11-18
  Administered 2021-11-15 – 2021-11-17 (×3): 300 mg via ORAL
  Filled 2021-11-15 (×6): qty 1
  Filled 2021-11-15 (×2): qty 7

## 2021-11-15 NOTE — Progress Notes (Signed)
Patient has had a difficult time getting comfortable. Patient has been asking about ativan which he received after he had started to become very restless and becoming more anxious.  ? ? ? ? 11/14/21 2200  ?Psych Admission Type (Psych Patients Only)  ?Admission Status Involuntary  ?Psychosocial Assessment  ?Patient Complaints Other (Comment) ?(withdrawals)  ?Eye Contact Brief  ?Facial Expression Anxious  ?Affect Anxious;Preoccupied  ?Speech Soft  ?Interaction Assertive  ?Motor Activity Restless  ?Appearance/Hygiene Disheveled  ?Behavior Characteristics Anxious;Fidgety  ?Mood Anxious;Preoccupied  ?Thought Process  ?Coherency WDL  ?Content Preoccupation  ?Delusions None reported or observed  ?Perception WDL  ?Hallucination None reported or observed  ?Judgment Poor  ?Confusion None  ?Danger to Self  ?Current suicidal ideation? Denies  ?Danger to Others  ?Danger to Others None reported or observed  ? ? ?

## 2021-11-15 NOTE — Group Note (Signed)
[facility] LCSW Group Therapy Note ? ?11/15/2021   10:00-11:00AM ? ?Type of Therapy and Topic:  Group Therapy:  Unhealthy versus Healthy Supports, Which Am I? ? ?Participation Level:  Minimal  ? ?Description of Group:  Patients in this group were introduced to the concept that additional supports including self-support are an essential part of recovery.  Initially a discussion was held about the differences between healthy versus unhealthy supports.  Patients were asked to share what unhealthy supports in their lives need to be addressed, as well as what additional healthy supports could be added for greater help in reaching their goals.   A song entitled "My Own Hero" was played and a group discussion ensued in which patients stated they could relate to the song and it inspired them to realize they have be willing to help themselves in order to succeed, because other people cannot achieve sobriety or stability for them.  We discussed adding a variety of healthy supports to address the various needs in patient lives, including becoming more self-supportive.  A song was played called "I Know Where I've Been" toward the end of group and used to conduct an inspirational wrap-up to group of remembering how far they have already come in their journey. ? ?Therapeutic Goals: ?1)  Highlight the differences between healthy and unhealthy supports ?2)  Suggest the importance of being a part of one's own support system ?2)  Discuss reasons people in one's life may eventually be unable to be continually supportive ? 3)  Identify the patient's current support system  ? 4) Elicit commitments to add healthy supports and to become more conscious of being self-supportive ?  ?Summary of Patient Progress:  The patient listed as healthy supports the following:  his girlfriend (who is an Therapist, occupational") and his parents and some friends.  The patient expressed that unhealthy supports to be addressed include some other friends.  He listened attentively  but did not engage much in the discussion. ? ?Therapeutic Modalities:   ?Motivational Interviewing ?Activity ? ?Lynnell Chad , MSW, LCSW  ?

## 2021-11-15 NOTE — Progress Notes (Signed)
?  Contraband ? ?Staff found a nicotine Vape under his bed during safety check.  Room check done and Patient had another nicotine vape in his pocket he stated he had the vape when he was admitted one of the vape was in a pocket and the other was in a sock per Patient.  ? ?No other contraband found. ? ?Patient stated that "Please don't let my girlfriend know that I had the nicotine vape on me, She is an Therapist, sports she will kick me out"  ? ?Support provided to Patient.  ?

## 2021-11-15 NOTE — Progress Notes (Signed)
Adult Psychoeducational Group Note ? ?Date:  11/15/2021 ?Time:  9:56 PM ? ?Group Topic/Focus:  ?Wrap-Up Group:   The focus of this group is to help patients review their daily goal of treatment and discuss progress on daily workbooks. ? ?Participation Level:  Minimal ? ?Participation Quality:  Appropriate ? ?Affect:  Appropriate ? ?Cognitive:  Alert and Appropriate ? ?Insight: Appropriate and Limited ? ?Engagement in Group:  Lacking and Limited ? ?Modes of Intervention:  Discussion ? ?Additional Comments:   ?Pt came at the end of group discussion but was able to share a few words with writer about his day. Pt states that he had a "decent" day and identified one goal of his to be to establish better goals and a step-by-step guide on how to accomplish them. ? ?Ruben [name] ?11/15/2021, 9:56 PM ?

## 2021-11-15 NOTE — [facility] Group Notes (Signed)
Adult Psychoeducational Group Not ?Date:  11/15/2021 ?Time:  1610-9604 ?Group Topic/Focus: PROGRESSIVE RELAXATION. [name] group where deep breathing is taught and tensing and relaxation muscle groups is used. Imagery is used as well.  Pts are asked to imagine 3 pillars that hold them up when they are not able to hold themselves up and to share that with the group. ? ?Participation Level:  Active ? ?Participation Quality:  Appropriate ? ?Affect:  Appropriate ? ?Cognitive:  Oriented ? ?Insight: Improving ? ?Engagement in Group:  Engaged ? ?Modes of Intervention:  Activity, Discussion, Education, and Support ? ?Additional Comments:  Pt rates his energy at [name] 9/10. States his Girlfriend, his parents and [name] friend hold him up. ? ?Ruben [name] ? ? ?

## 2021-11-15 NOTE — [facility] Group Notes (Signed)
Adult Psychoeducational Group Note ? ?Date:  11/15/2021 ?Time:  9:59 AM ? ?Group Topic/Focus:  ?Orientation:   The focus of this group is to educate the patient on the purpose and policies of crisis stabilization and provide a format to answer questions about their admission.  The group details unit policies and expectations of patients while admitted. ? ?Participation Level:  Active ? ?Participation Quality:  Appropriate ? ?Affect:  Appropriate ? ?Cognitive:  Appropriate ? ?Insight: Appropriate ? ?Engagement in Group:  Engaged ? ?Modes of Intervention:  Discussion ? ?Additional Comments:  Patient attended morning orientation/goal setting group and participated.  ? ?Nayib Remer W Lorenda Peck ?11/15/2021, 9:59 AM ?

## 2021-11-15 NOTE — [facility] Group Notes (Signed)
Adult Psychoeducational Group  ?Date:  11/03/14/2023 ?Time:  0900-1045 ? ?Group Topic/Focus: Continuation of the group from Saturday. Looking at the lists that were created and talking about what needs to be done with the homework of 30 positives about themselves.  ?                                   Talking about taking their power back and helping themselves to develop a positive self esteem. ?     ?Participation Quality:  did not attend ? ?Rikita Grabert A ? ? ?
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
## 2021-11-15 NOTE — Progress Notes (Signed)
D. Pt presents with an anxious affect/ mood- rated his anxiety a 10/10, and endorsed some moderate withdrawal symptoms earlier in the shift ( nausea/ vomiting, sweating, diarrhea). Pt stated that his goal was "to work on my anxiety." Pt currently denies SI/HI and AVH .  ?A. Labs and vitals monitored. Pt given and educated on medications. Pt supported emotionally and encouraged to express concerns and ask questions.   ?R. Pt remains safe with 15 minute checks. Will continue POC. ? ?  ?
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
## 2021-11-16 ENCOUNTER — Encounter (HOSPITAL_COMMUNITY): Payer: Self-pay

## 2021-11-16 DIAGNOSIS — F1410 Cocaine abuse, uncomplicated: Secondary | ICD-10-CM

## 2021-11-16 DIAGNOSIS — F332 Major depressive disorder, recurrent severe without psychotic features: Principal | ICD-10-CM

## 2021-11-16 DIAGNOSIS — F17200 Nicotine dependence, unspecified, uncomplicated: Secondary | ICD-10-CM

## 2021-11-16 DIAGNOSIS — F1019 Alcohol abuse with unspecified alcohol-induced disorder: Secondary | ICD-10-CM

## 2021-11-16 LAB — URINALYSIS, COMPLETE (UACMP) WITH MICROSCOPIC
Bacteria, UA: NONE SEEN
Bilirubin Urine: NEGATIVE
Glucose, UA: NEGATIVE mg/dL
Ketones, ur: NEGATIVE mg/dL
Leukocytes,Ua: NEGATIVE
Nitrite: NEGATIVE
Protein, ur: NEGATIVE mg/dL
Specific Gravity, Urine: 1.003 — ABNORMAL LOW (ref 1.005–1.030)
pH: 7 (ref 5.0–8.0)

## 2021-11-16 MED ORDER — HYDROXYZINE HCL 50 MG PO TABS
50.0000 mg | ORAL_TABLET | Freq: Four times a day (QID) | ORAL | Status: AC | PRN
  Administered 2021-11-16 – 2021-11-17 (×2): 50 mg via ORAL
  Filled 2021-11-16 (×2): qty 1

## 2021-11-16 NOTE — Progress Notes (Signed)
?   11/16/21 0000  ?Psych Admission Type (Psych Patients Only)  ?Admission Status Involuntary  ?Psychosocial Assessment  ?Patient Complaints Anxiety;Irritability;Insomnia  ?Eye Contact Brief  ?Facial Expression Angry;Anxious  ?Affect Anxious;Irritable;Labile;Preoccupied  ?Speech Soft  ?Interaction Demanding  ?Motor Activity Pacing;Restless  ?Appearance/Hygiene Disheveled  ?Behavior Characteristics Anxious  ?Mood Anxious;Labile  ?Thought Process  ?Coherency Tangential  ?Content Blaming self  ?Delusions None reported or observed;Persecutory  ?Perception WDL  ?Hallucination None reported or observed  ?Judgment Poor  ?Confusion None  ?Danger to Self  ?Current suicidal ideation? Denies  ?Danger to Others  ?Danger to Others None reported or observed  ? ? ?

## 2021-11-16 NOTE — Progress Notes (Signed)
Patient refused blood work this am. Support and encouragement provided.  ?

## 2021-11-16 NOTE — [facility] Group Notes (Signed)
Spiritual care group on grief and loss facilitated by chaplain Janne Napoleon, [facility]  ? ?Group Goal:  ? ?Support / Education around grief and loss  ? ?Members engage in facilitated group support and psycho-social education.  ? ?Group Description:  ? ?Following introductions and group rules, group members engaged in facilitated group dialog and support around topic of loss, with particular support around experiences of loss in their lives. Group Identified types of loss (relationships / self / things) and identified patterns, circumstances, and changes that precipitate losses. Reflected on thoughts / feelings around loss, normalized grief responses, and recognized variety in grief experience. Group noted Worden's four tasks of grief in discussion.  ? ?Group drew on Adlerian / Rogerian, narrative, MI,  ? ?Patient Progress: Patient came in at the very end of group and did not participate. ? ?[employer], Bcc ?Pager, [phone] ?

## 2021-11-16 NOTE — Progress Notes (Signed)
[facility] MD Progress Note ? ?11/16/2021 2:21 PM ?Ruben [name]  ?MRN:  [id number] ?Subjective:  Patient is a 31 year old male with past psychiatric history of depression, alcohol abuse, cocaine abuse, PTSD, polysubstance use and medical history of SCID  initially presented to [facility] ED under IVC for threatening to harm himself.Marland Kitchen He was assessed by psychiatry and was recommended for inpatient psychiatric admission.  Patient was admitted to [facility] adult unit on 11/14/2021. ? ?Patient refused blood work this a.m.  Patient was pacing in and out of his room last evening.  Staff found nicotine vape under his bed and in his pocket which was removed. ?Daily note : Pt is seen and examined today.  He is irritable , anxious and focused on discharge.  Pt states he is anxious. Patient rates his anxiety at 10/10 on a scale of 1-10 where 10 being severe symptoms. Discussed that we are not planning to discharge him today as he just came yesterday.  He states that gabapentin and Zoloft are not helping him with anxiety. Patient is drug-seeking and asking for Ativan.  Discussed that he will not get Ativan if his withdrawal symptoms rating is less than 10.  He slept 4 hours last night.  Pt states his appetite is stable.  He states that he is not withdrawing.  Currently, Pt denies any suicidal ideation, homicidal ideation and, visual and auditory hallucination.  He reports diarrhea.  He states he already took Imodium. Pt denies any headache, nausea, vomiting, dizziness, chest pain, SOB, abdominal pain and constipation. Pt denies any medication side effects and has been tolerating it well. ? ?Principal Problem: MDD (major depressive disorder), recurrent episode, severe (HCC) ?Diagnosis: Principal Problem: ?  MDD (major depressive disorder), recurrent episode, severe (HCC) ?Active Problems: ?  Nicotine dependence ?  PTSD (post-traumatic stress disorder) ?  Polysubstance abuse (HCC) ?  Alcohol abuse with alcohol-induced disorder (HCC) ?  Cocaine  abuse (HCC) ? ?Total Time spent with patient: 30 minutes ? ?Past Psychiatric History: See H&P ? ?Past Medical History:  ?Past Medical History:  ?Diagnosis Date  ? ADHD (attention deficit hyperactivity disorder)   ? Anxiety   ? Depression   ? Suicide attempt [facility])   ?  ?Past Surgical History:  ?Procedure Laterality Date  ? DENTAL SURGERY    ? ?Family History:  ?Family History  ?Problem Relation Age of Onset  ? Hypertension Father   ? Cancer Paternal Grandfather   ? ?Family Psychiatric  History: See H&P ?Social History:  ?Social History  ? ?Substance and Sexual Activity  ?Alcohol Use Yes  ?   ?Social History  ? ?Substance and Sexual Activity  ?Drug Use Yes  ? Types: Marijuana  ?  ?Social History  ? ?Socioeconomic History  ? Marital status: Single  ?  Spouse name: Not on file  ? Number of children: Not on file  ? Years of education: Not on file  ? Highest education level: Not on file  ?Occupational History  ? Not on file  ?Tobacco Use  ? Smoking status: Former  ?  Packs/day: 1.00  ?  Types: Cigarettes  ? Smokeless tobacco: Never  ?Vaping Use  ? Vaping Use: Every day  ? Substances: Nicotine, Flavoring  ?Substance and Sexual Activity  ? Alcohol use: Yes  ? Drug use: Yes  ?  Types: Marijuana  ? Sexual activity: Not on file  ?Other Topics Concern  ? Not on file  ?Social History Narrative  ? Not on file  ? ?Social  Determinants of Health  ? ?Financial Resource Strain: Not on file  ?Food Insecurity: Not on file  ?Transportation Needs: Not on file  ?Physical Activity: Not on file  ?Stress: Not on file  ?Social Connections: Not on file  ? ?Additional Social History:  ?  ?  ?  ?  ?  ?  ?  ?  ?  ?  ?  ? ?Sleep: Poor ? ?Appetite:  Good ? ?Current Medications: ?Current Facility-Administered Medications  ?Medication Dose Route Frequency Provider Last Rate Last Admin  ? acetaminophen (TYLENOL) tablet 650 mg  650 mg Oral Q6H PRN Bobbitt, Shalon E, NP      ? albuterol (VENTOLIN HFA) 108 (90 Base) MCG/ACT inhaler 1 puff  1 puff  Inhalation Q6H PRN Mariel Craft, MD      ? chlordiazePOXIDE (LIBRIUM) capsule 25 mg  25 mg Oral TID Nelly Rout, MD   25 mg at 11/16/21 1148  ? Followed by  ? [START ON 11/17/2021] chlordiazePOXIDE (LIBRIUM) capsule 25 mg  25 mg Oral Dan Europe, MD      ? Followed by  ? Melene Muller ON 11/18/2021] chlordiazePOXIDE (LIBRIUM) capsule 25 mg  25 mg Oral Daily Nelly Rout, MD      ? gabapentin (NEURONTIN) capsule 300 mg  300 mg Oral QHS Karsten Ro, MD   300 mg at 11/15/21 2104  ? hydrOXYzine (ATARAX) tablet 50 mg  50 mg Oral Q6H PRN Nelly Rout, MD      ? loperamide (IMODIUM) capsule 2-4 mg  2-4 mg Oral PRN Mariel Craft, MD   2 mg at 11/16/21 1150  ? LORazepam (ATIVAN) injection 2 mg  2 mg Intramuscular PRN Karsten Ro, MD      ? LORazepam (ATIVAN) tablet 1 mg  1 mg Oral Q6H PRN Karsten Ro, MD   1 mg at 11/15/21 1505  ? multivitamin with minerals tablet 1 tablet  1 tablet Oral Daily Mariel Craft, MD   1 tablet at 11/16/21 7001  ? nicotine polacrilex (NICORETTE) gum 2 mg  2 mg Oral PRN Bobbitt, Shalon E, NP   2 mg at 11/15/21 2100  ? OLANZapine zydis (ZYPREXA) disintegrating tablet 10 mg  10 mg Oral Q8H PRN Mariel Craft, MD   10 mg at 11/15/21 2344  ? And  ? ziprasidone (GEODON) injection 20 mg  20 mg Intramuscular PRN Mariel Craft, MD      ? ondansetron (ZOFRAN-ODT) disintegrating tablet 4 mg  4 mg Oral Q6H PRN Mariel Craft, MD   4 mg at 11/15/21 2100  ? sertraline (ZOLOFT) tablet 100 mg  100 mg Oral Daily Karsten Ro, MD   100 mg at 11/16/21 0943  ? thiamine (B-1) injection 100 mg  100 mg Intramuscular Once Mariel Craft, MD      ? thiamine tablet 100 mg  100 mg Oral Daily Mariel Craft, MD   100 mg at 11/16/21 7494  ? ? ?Lab Results: No results found for this or any previous visit (from the past 48 hour(s)). ? ?Blood Alcohol level:  ?Lab Results  ?Component Value Date  ? ETH 220 (H) 11/12/2021  ? ETH <11 02/06/2014  ? ? ?Metabolic Disorder Labs: ?Lab Results   ?Component Value Date  ? HGBA1C  01/08/2010  ?  5.3 ?(NOTE)  According to the ADA Clinical Practice Recommendations for 2011, when HbA1c is used as a screening test:   >=6.5%   Diagnostic of Diabetes Mellitus           (if abnormal result ? is confirmed)  5.7-6.4%   Increased risk of developing Diabetes Mellitus  References:Diagnosis and Classification of Diabetes Mellitus,Diabetes Care,2011,34(Suppl 1):S62-S69 and Standards of Medical Care in         Diabetes - 2011,Diabetes ZOXW,9604,54Care,2011,34  ?(Suppl 1):S11-S61.  ? MPG  01/08/2010  ?  105 ?(NOTE) Please note change in reference range(s).   ? ?No results found for: PROLACTIN ?Lab Results  ?Component Value Date  ? CHOL  01/08/2010  ?  140        ?ATP III CLASSIFICATION: ? <200     mg/dL   Desirable ? 098-119200-239  mg/dL   Borderline High ? >=147>=240    mg/dL   High ?        ? TRIG 71 01/08/2010  ? HDL 56 01/08/2010  ? CHOLHDL 2.5 01/08/2010  ? VLDL 14 01/08/2010  ? LDLCALC  01/08/2010  ?  70        ?Total Cholesterol/HDL:CHD Risk ?Coronary Heart Disease Risk Table ?                    Men   Women ? 1/2 Average Risk   3.4   3.3 ? Average Risk       5.0   4.4 ? 2 X Average Risk   9.6   7.1 ? 3 X Average Risk  23.4   11.0 ?       ?Use the calculated Patient Ratio ?above and the CHD Risk Table ?to determine the patient's CHD Risk. ?       ?ATP III CLASSIFICATION (LDL): ? <100     mg/dL   Optimal ? 829-562100-129  mg/dL   Near or Above ?                   Optimal ? 130-159  mg/dL   Borderline ? 160-189  mg/dL   High ? >130>190     mg/dL   Very High  ? ? ?Physical Findings: ?AIMS:  , ,  ,  ,    ?CIWA:  CIWA-Ar Total: 4 ?COWS:  COWS Total Score: 6 ? ?Musculoskeletal: ?Strength & Muscle Tone: within normal limits ?Gait & Station: normal ?Patient leans: N/A ? ?Psychiatric Specialty Exam: ? ?Presentation  ?General Appearance: Casual ? ?Eye Contact:Fair ? ?Speech:Clear and Coherent ? ?Speech Volume:Normal ? ?Handedness:No data  recorded ? ?Mood and Affect  ?Mood:Anxious; Irritable ? ?Affect:Constricted ? ? ?Thought Process  ?Thought Processes:Goal Directed; Linear ? ?Descriptions of Associations:Tangential (Focused on discharge) ? ?Orientation:Fu

## 2021-11-16 NOTE — Group Note (Signed)
LCSW Group Therapy Note ? ?Group Date: 11/16/2021 ?Start Time: 1300 ?End Time: 1400 ? ?Type of Therapy and Topic:  Group Therapy - Healthy vs Unhealthy Coping Skills ? ?Participation Level:  Active  ? ?Description of Group ?The focus of this group was to determine what unhealthy coping techniques typically are used by group members and what healthy coping techniques would be helpful in coping with various problems. Patients were guided in becoming aware of the differences between healthy and unhealthy coping techniques. Patients were asked to identify 2-3 healthy coping skills they would like to learn to use more effectively. ? ?Therapeutic Goals ?Patients learned that coping is what human beings do all day long to deal with various situations in their lives ?Patients defined and discussed healthy vs unhealthy coping techniques ?Patients identified their preferred coping techniques and identified whether these were healthy or unhealthy ?Patients determined 2-3 healthy coping skills they would like to become more familiar with and use more often. ?Patients provided support and ideas to each other ? ? ?Summary of Patient Progress:  The Pt attended group and accepted the handouts that were provided.  The Pt demonstrated understanding of the topic and was appropriate with their peers.  The Pt was able to identify coping skills and other ways to participate in self-care.  ? ? ?Therapeutic Modalities ?Cognitive Behavioral Therapy ?Motivational Interviewing ? ?Fabyan Loughmiller M Aaralynn Shepheard, LCSWA ?11/16/2021  2:21 PM   ?

## 2021-11-16 NOTE — BH IP Treatment Plan (Signed)
Interdisciplinary Treatment and Diagnostic Plan Update ? ?11/16/2021 ?Time of Session: 9:55am ?Ruben [name] ?MRN: [id number] ? ?Principal Diagnosis: MDD (major depressive disorder), recurrent episode, severe (HCC) ? ?Secondary Diagnoses: Principal Problem: ?  MDD (major depressive disorder), recurrent episode, severe (HCC) ?Active Problems: ?  Nicotine dependence ?  PTSD (post-traumatic stress disorder) ?  Polysubstance abuse (HCC) ?  Alcohol abuse with alcohol-induced disorder (HCC) ?  Cocaine abuse (HCC) ? ? ?Current Medications:  ?Current Facility-Administered Medications  ?Medication Dose Route Frequency Provider Last Rate Last Admin  ? acetaminophen (TYLENOL) tablet 650 mg  650 mg Oral Q6H PRN Bobbitt, Shalon E, NP      ? albuterol (VENTOLIN HFA) 108 (90 Base) MCG/ACT inhaler 1 puff  1 puff Inhalation Q6H PRN Mariel Craft, MD      ? chlordiazePOXIDE (LIBRIUM) capsule 25 mg  25 mg Oral TID Mariel Craft, MD   25 mg at 11/16/21 0946  ? Followed by  ? [START ON 11/17/2021] chlordiazePOXIDE (LIBRIUM) capsule 25 mg  25 mg Oral BH-qamhs Mariel Craft, MD      ? Followed by  ? Melene Muller ON 11/18/2021] chlordiazePOXIDE (LIBRIUM) capsule 25 mg  25 mg Oral Daily Mariel Craft, MD      ? gabapentin (NEURONTIN) capsule 300 mg  300 mg Oral QHS Karsten Ro, MD   300 mg at 11/15/21 2104  ? hydrOXYzine (ATARAX) tablet 25 mg  25 mg Oral Q6H PRN Mariel Craft, MD   25 mg at 11/15/21 2344  ? loperamide (IMODIUM) capsule 2-4 mg  2-4 mg Oral PRN Mariel Craft, MD   2 mg at 11/15/21 2344  ? LORazepam (ATIVAN) injection 2 mg  2 mg Intramuscular PRN Karsten Ro, MD      ? LORazepam (ATIVAN) tablet 1 mg  1 mg Oral Q6H PRN Karsten Ro, MD   1 mg at 11/15/21 1505  ? multivitamin with minerals tablet 1 tablet  1 tablet Oral Daily Mariel Craft, MD   1 tablet at 11/16/21 6812  ? nicotine polacrilex (NICORETTE) gum 2 mg  2 mg Oral PRN Bobbitt, Shalon E, NP   2 mg at 11/15/21 2100  ? OLANZapine zydis (ZYPREXA)  disintegrating tablet 10 mg  10 mg Oral Q8H PRN Mariel Craft, MD   10 mg at 11/15/21 2344  ? And  ? ziprasidone (GEODON) injection 20 mg  20 mg Intramuscular PRN Mariel Craft, MD      ? ondansetron (ZOFRAN-ODT) disintegrating tablet 4 mg  4 mg Oral Q6H PRN Mariel Craft, MD   4 mg at 11/15/21 2100  ? polyethylene glycol (MIRALAX / GLYCOLAX) packet 17 g  17 g Oral Daily Doda, Vandana, MD      ? sertraline (ZOLOFT) tablet 100 mg  100 mg Oral Daily Karsten Ro, MD   100 mg at 11/16/21 0943  ? thiamine (B-1) injection 100 mg  100 mg Intramuscular Once Mariel Craft, MD      ? thiamine tablet 100 mg  100 mg Oral Daily Mariel Craft, MD   100 mg at 11/16/21 0946  ? ?PTA Medications: ?Medications Prior to Admission  ?Medication Sig Dispense Refill Last Dose  ? albuterol (VENTOLIN HFA) 108 (90 Base) MCG/ACT inhaler Inhale 1 puff into the lungs every 6 (six) hours as needed for wheezing or shortness of breath.     ? amphetamine-dextroamphetamine (ADDERALL XR) 30 MG 24 hr capsule Take 30 mg by mouth daily as needed (ADD).     ?  busPIRone (BUSPAR) 7.5 MG tablet Take 7.5 mg by mouth 2 (two) times daily.     ? clonazePAM (KLONOPIN) 0.5 MG tablet Take 0.5 mg by mouth 2 (two) times daily.     ? lamoTRIgine (LAMICTAL) 200 MG tablet Take 200 mg by mouth every morning.     ? QUEtiapine (SEROQUEL) 50 MG tablet Take 50 mg by mouth every morning.     ? ? ?Patient Stressors:   ? ?Patient Strengths:   ? ?Treatment Modalities: Medication Management, Group therapy, Case management,  ?1 to 1 session with clinician, Psychoeducation, Recreational therapy. ? ? ?Physician Treatment Plan for Primary Diagnosis: MDD (major depressive disorder), recurrent episode, severe (HCC) ?Long Term Goal(s): Improvement in symptoms so as ready for discharge  ? ?Short Term Goals: Ability to identify changes in lifestyle to reduce recurrence of condition will improve ?Ability to verbalize feelings will improve ?Ability to disclose and discuss  suicidal ideas ?Ability to demonstrate self-control will improve ?Ability to identify and develop effective coping behaviors will improve ?Ability to maintain clinical measurements within normal limits will improve ?Compliance with prescribed medications will improve ?Ability to identify triggers associated with substance abuse/mental health issues will improve ? ?Medication Management: Evaluate patient's response, side effects, and tolerance of medication regimen. ? ?Therapeutic Interventions: 1 to 1 sessions, Unit Group sessions and Medication administration. ? ?Evaluation of Outcomes: Progressing ? ?Physician Treatment Plan for Secondary Diagnosis: Principal Problem: ?  MDD (major depressive disorder), recurrent episode, severe (HCC) ?Active Problems: ?  Nicotine dependence ?  PTSD (post-traumatic stress disorder) ?  Polysubstance abuse (HCC) ?  Alcohol abuse with alcohol-induced disorder (HCC) ?  Cocaine abuse (HCC) ? ?Long Term Goal(s): Improvement in symptoms so as ready for discharge  ? ?Short Term Goals: Ability to identify changes in lifestyle to reduce recurrence of condition will improve ?Ability to verbalize feelings will improve ?Ability to disclose and discuss suicidal ideas ?Ability to demonstrate self-control will improve ?Ability to identify and develop effective coping behaviors will improve ?Ability to maintain clinical measurements within normal limits will improve ?Compliance with prescribed medications will improve ?Ability to identify triggers associated with substance abuse/mental health issues will improve    ? ?Medication Management: Evaluate patient's response, side effects, and tolerance of medication regimen. ? ?Therapeutic Interventions: 1 to 1 sessions, Unit Group sessions and Medication administration. ? ?Evaluation of Outcomes: Progressing ? ? ?RN Treatment Plan for Primary Diagnosis: MDD (major depressive disorder), recurrent episode, severe (HCC) ?Long Term Goal(s): Knowledge of  disease and therapeutic regimen to maintain health will improve ? ?Short Term Goals: Ability to remain free from injury will improve, Ability to verbalize frustration and anger appropriately will improve, Ability to demonstrate self-control, Ability to participate in decision making will improve, Ability to verbalize feelings will improve, Ability to disclose and discuss suicidal ideas, Ability to identify and develop effective coping behaviors will improve, and Compliance with prescribed medications will improve ? ?Medication Management: RN will administer medications as ordered by provider, will assess and evaluate patient's response and provide education to patient for prescribed medication. RN will report any adverse and/or side effects to prescribing provider. ? ?Therapeutic Interventions: 1 on 1 counseling sessions, Psychoeducation, Medication administration, Evaluate responses to treatment, Monitor vital signs and CBGs as ordered, Perform/monitor CIWA, COWS, AIMS and Fall Risk screenings as ordered, Perform wound care treatments as ordered. ? ?Evaluation of Outcomes: Progressing ? ? ?LCSW Treatment Plan for Primary Diagnosis: MDD (major depressive disorder), recurrent episode, severe (HCC) ?Long Term Goal(s): Safe transition to  appropriate next level of care at discharge, Engage patient in therapeutic group addressing interpersonal concerns. ? ?Short Term Goals: Engage patient in aftercare planning with referrals and resources, Increase social support, Increase ability to appropriately verbalize feelings, Increase emotional regulation, Facilitate acceptance of mental health diagnosis and concerns, Facilitate patient progression through stages of change regarding substance use diagnoses and concerns, Identify triggers associated with mental health/substance abuse issues, and Increase skills for wellness and recovery ? ?Therapeutic Interventions: Assess for all discharge needs, 1 to 1 time with Child psychotherapistocial worker,  Explore available resources and support systems, Assess for adequacy in community support network, Educate family and significant other(s) on suicide prevention, Complete Psychosocial Assessment, [employer]

## 2021-11-16 NOTE — Group Note (Signed)
Recreation Therapy Group Note ? ? ?Group Topic:Stress Management  ?Group Date: 11/16/2021 ?Start Time: [id number] ?End Time: 0955 ?Facilitators: Caroll Rancher, LRT,CTRS ?Location: [address] Dayroom ? ? ?Goal Area(s) Addresses:  ?Patient will actively participate in stress management techniques presented during session.  ?Patient will successfully identify benefit of practicing stress management post d/c.  ? ?Group Description:  Guided Imagery. LRT provided education, instruction, and demonstration on practice of visualization via guided imagery. Patient was asked to participate in the technique introduced during session. LRT debriefed including topics of mindfulness, stress management and specific scenarios each patient could use these techniques. Patients were given suggestions of ways to access scripts post d/c and encouraged to explore Youtube and other apps available on smartphones, tablets, and computers. ? ? ?Affect/Mood: N/A ?  ?Participation Level: Did not attend ?  ? ?Clinical Observations/Individualized Feedback:   ?  ? ?Plan: Continue to engage patient in RT group sessions 2-3x/week. ? ? ?Caroll Rancher, LRT,CTRS ?11/16/2021 10:57 AM ?

## 2021-11-16 NOTE — BHH Counselor (Signed)
CSW provided the Pt with a packet that contains information including shelter and housing resources, free and reduced price food information, clothing resources, crisis center information, a [employer] card, and suicide prevention information.   

## 2021-11-16 NOTE — Progress Notes (Signed)
?   11/16/21 0930  ?Psych Admission Type (Psych Patients Only)  ?Admission Status Involuntary  ?Psychosocial Assessment  ?Patient Complaints Agitation;Anxiety;Irritability  ?Eye Contact Brief  ?Facial Expression Angry;Anxious  ?Affect Anxious;Apprehensive;Preoccupied  ?Speech Soft  ?Interaction Demanding  ?Motor Activity Other (Comment) ?(Steady)  ?Appearance/Hygiene Disheveled  ?Behavior Characteristics Anxious  ?Mood Anxious;Labile  ?Thought Process  ?Coherency Tangential  ?Content Blaming self  ?Delusions None reported or observed  ?Perception WDL  ?Hallucination None reported or observed  ?Judgment Poor  ?Confusion None  ?Danger to Self  ?Current suicidal ideation? Denies  ?Danger to Others  ?Danger to Others None reported or observed  ? ? ?

## 2021-11-16 NOTE — [facility] Group Notes (Deleted)
Patient did not attend the AA meeting.

## 2021-11-16 NOTE — Plan of Care (Signed)
  Problem: Activity: Goal: Interest or engagement in activities will improve Outcome: Progressing   Problem: Coping: Goal: Ability to verbalize frustrations and anger appropriately will improve Outcome: Progressing Goal: Ability to demonstrate self-control will improve Outcome: Progressing   Problem: Safety: Goal: Periods of time without injury will increase Outcome: Progressing   

## 2021-11-16 NOTE — Progress Notes (Signed)
Patient refused am v/s support and encouragement provided.  ?

## 2021-11-16 NOTE — [facility] Group Notes (Signed)
Patient attended the AA meeting

## 2021-11-16 NOTE — Progress Notes (Signed)
Patient has been pacing in and out of the dayroom this evening Patient very needy and anxious. Worried about the issue of contraband that was found in his room. Prn Patient anxiety /agitated prn vistaril given not effective, c/o not able to sleep requesting Seroquel Prn Zyprexa given and effective.  ? ?Support and encouragement provided. Q 15 minutes safety checks ongoing. Patient remains safe.  ?

## 2021-11-17 MED ORDER — QUETIAPINE FUMARATE 50 MG PO TABS
50.0000 mg | ORAL_TABLET | Freq: Every day | ORAL | Status: DC
Start: 2021-11-17 — End: 2021-11-18
  Administered 2021-11-17: 50 mg via ORAL
  Filled 2021-11-17 (×2): qty 1
  Filled 2021-11-17 (×2): qty 7

## 2021-11-17 NOTE — Progress Notes (Signed)
[facility] MD Progress Note ? ?11/17/2021 11:33 AM ?Ruben [name]  ?MRN:  [id number] ?Subjective:  Patient is a 31 year old male with past psychiatric history of depression, alcohol abuse, cocaine abuse, PTSD, polysubstance use and medical history of SCID  initially presented to [facility] ED under IVC for threatening to harm himself.Marland Kitchen He was assessed by psychiatry and was recommended for inpatient psychiatric admission.  Patient was admitted to [facility] adult unit on 11/14/2021. ? ?Daily note : Pt is seen and examined today.  He states that his mood is "all right ".  He reports improvement in his depression and anxiety.  He is less irritable today.  He denies any cravings.  He reports that he did not sleep well last night and gabapentin does not help him.  He is requesting for Seroquel.  He talked to his girlfriend yesterday and states they are still together.  He reports that his parents removed all alcohol from the house.  He is denying any withdrawal symptoms except mild tremors.  Pt states his appetite is stable.  He states that he is interested in IOP and has 1 in mind but does not know its name.  Discussed that I will talk to social worker to provide him more resources for IOP.  He verbalizes understanding.  Currently, Pt denies any suicidal ideation, homicidal ideation and, visual and auditory hallucination. Pt denies any headache, diarrhea, nausea, vomiting, dizziness, chest pain, SOB, abdominal pain and constipation. Pt denies any medication side effects and has been tolerating it well. ? ?Principal Problem: MDD (major depressive disorder), recurrent episode, severe (HCC) ?Diagnosis: Principal Problem: ?  MDD (major depressive disorder), recurrent episode, severe (HCC) ?Active Problems: ?  Nicotine dependence ?  PTSD (post-traumatic stress disorder) ?  Polysubstance abuse (HCC) ?  Alcohol abuse with alcohol-induced disorder (HCC) ?  Cocaine abuse (HCC) ? ?Total Time spent with patient: 30 minutes ? ?Past Psychiatric  History: See H&P ? ?Past Medical History:  ?Past Medical History:  ?Diagnosis Date  ? ADHD (attention deficit hyperactivity disorder)   ? Anxiety   ? Depression   ? Suicide attempt [facility])   ?  ?Past Surgical History:  ?Procedure Laterality Date  ? DENTAL SURGERY    ? ?Family History:  ?Family History  ?Problem Relation Age of Onset  ? Hypertension Father   ? Cancer Paternal Grandfather   ? ?Family Psychiatric  History: See H&P ?Social History:  ?Social History  ? ?Substance and Sexual Activity  ?Alcohol Use Yes  ?   ?Social History  ? ?Substance and Sexual Activity  ?Drug Use Yes  ? Types: Marijuana  ?  ?Social History  ? ?Socioeconomic History  ? Marital status: Single  ?  Spouse name: Not on file  ? Number of children: Not on file  ? Years of education: Not on file  ? Highest education level: Not on file  ?Occupational History  ? Not on file  ?Tobacco Use  ? Smoking status: Former  ?  Packs/day: 1.00  ?  Types: Cigarettes  ? Smokeless tobacco: Never  ?Vaping Use  ? Vaping Use: Every day  ? Substances: Nicotine, Flavoring  ?Substance and Sexual Activity  ? Alcohol use: Yes  ? Drug use: Yes  ?  Types: Marijuana  ? Sexual activity: Not on file  ?Other Topics Concern  ? Not on file  ?Social History Narrative  ? Not on file  ? ?Social Determinants of Health  ? ?Financial Resource Strain: Not on file  ?Food Insecurity:  Not on file  ?Transportation Needs: Not on file  ?Physical Activity: Not on file  ?Stress: Not on file  ?Social Connections: Not on file  ? ?Additional Social History:  ?  ?  ?  ?  ?  ?  ?  ?  ?  ?  ?  ? ?Sleep: Poor ? ?Appetite:  Good ? ?Current Medications: ?Current Facility-Administered Medications  ?Medication Dose Route Frequency Provider Last Rate Last Admin  ? acetaminophen (TYLENOL) tablet 650 mg  650 mg Oral Q6H PRN Bobbitt, Shalon E, NP      ? albuterol (VENTOLIN HFA) 108 (90 Base) MCG/ACT inhaler 1 puff  1 puff Inhalation Q6H PRN Mariel CraftMaurer, Sheila M, MD      ? chlordiazePOXIDE (LIBRIUM) capsule 25  mg  25 mg Oral Dan EuropeBH-qamhs Kumar, Archana, MD   25 mg at 11/17/21 0920  ? Followed by  ? [START ON 11/18/2021] chlordiazePOXIDE (LIBRIUM) capsule 25 mg  25 mg Oral Daily Nelly RoutKumar, Archana, MD      ? gabapentin (NEURONTIN) capsule 300 mg  300 mg Oral QHS Karsten Rooda, Kendricks Reap, MD   300 mg at 11/16/21 2114  ? hydrOXYzine (ATARAX) tablet 50 mg  50 mg Oral Q6H PRN Nelly RoutKumar, Archana, MD   50 mg at 11/17/21 1013  ? loperamide (IMODIUM) capsule 2-4 mg  2-4 mg Oral PRN Mariel CraftMaurer, Sheila M, MD   2 mg at 11/17/21 [id number]  ? LORazepam (ATIVAN) injection 2 mg  2 mg Intramuscular PRN Karsten Rooda, Brynlynn Walko, MD      ? LORazepam (ATIVAN) tablet 1 mg  1 mg Oral Q6H PRN Karsten Rooda, Aryon Nham, MD   1 mg at 11/15/21 1505  ? multivitamin with minerals tablet 1 tablet  1 tablet Oral Daily Mariel CraftMaurer, Sheila M, MD   1 tablet at 11/17/21 0920  ? nicotine polacrilex (NICORETTE) gum 2 mg  2 mg Oral PRN Bobbitt, Shalon E, NP   2 mg at 11/17/21 0917  ? OLANZapine zydis (ZYPREXA) disintegrating tablet 10 mg  10 mg Oral Q8H PRN Mariel CraftMaurer, Sheila M, MD   10 mg at 11/16/21 2114  ? And  ? ziprasidone (GEODON) injection 20 mg  20 mg Intramuscular PRN Mariel CraftMaurer, Sheila M, MD      ? ondansetron (ZOFRAN-ODT) disintegrating tablet 4 mg  4 mg Oral Q6H PRN Mariel CraftMaurer, Sheila M, MD   4 mg at 11/15/21 2100  ? QUEtiapine (SEROQUEL) tablet 50 mg  50 mg Oral QHS Karsten Rooda, Firmin Belisle, MD      ? sertraline (ZOLOFT) tablet 100 mg  100 mg Oral Daily Karsten Rooda, Aerica Rincon, MD   100 mg at 11/17/21 [id number]  ? thiamine (B-1) injection 100 mg  100 mg Intramuscular Once Mariel CraftMaurer, Sheila M, MD      ? thiamine tablet 100 mg  100 mg Oral Daily Mariel CraftMaurer, Sheila M, MD   100 mg at 11/17/21 [id number]  ? ? ?Lab Results:  ?Results for orders placed or performed during the hospital encounter of 11/14/21 (from the past 48 hour(s))  ?Urinalysis, Complete w Microscopic     Status: Abnormal  ? Collection Time: 11/16/21  2:16 PM  ?Result Value Ref Range  ? Color, Urine STRAW (A) YELLOW  ? APPearance CLEAR CLEAR  ? Specific Gravity, Urine 1.003 (L) 1.005 - 1.030  ?  pH 7.0 5.0 - 8.0  ? Glucose, UA NEGATIVE NEGATIVE mg/dL  ? Hgb urine dipstick MODERATE (A) NEGATIVE  ? Bilirubin Urine NEGATIVE NEGATIVE  ? Ketones, ur NEGATIVE NEGATIVE mg/dL  ? Protein, ur NEGATIVE NEGATIVE mg/dL  ?  Nitrite NEGATIVE NEGATIVE  ? Leukocytes,Ua NEGATIVE NEGATIVE  ? RBC / HPF 0-5 0 - 5 RBC/hpf  ? WBC, UA 0-5 0 - 5 WBC/hpf  ? Bacteria, UA NONE SEEN NONE SEEN  ?  Comment: Performed at [facility], [address], [place], [place] [address]  ? ? ?Blood Alcohol level:  ?Lab Results  ?Component Value Date  ? ETH 220 (H) 11/12/2021  ? ETH <11 02/06/2014  ? ? ?Metabolic Disorder Labs: ?Lab Results  ?Component Value Date  ? HGBA1C  01/08/2010  ?  5.3 ?(NOTE)                                                                       According to the ADA Clinical Practice Recommendations for 2011, when HbA1c is used as a screening test:   >=6.5%   Diagnostic of Diabetes Mellitus           (if abnormal result ? is confirmed)  5.7-6.4%   Increased risk of developing Diabetes Mellitus  References:Diagnosis and Classification of Diabetes Mellitus,Diabetes Care,2011,34(Suppl 1):S62-S69 and Standards of Medical Care in         Diabetes - 2011,Diabetes CWCB,7628,31  ?(Suppl 1):S11-S61.  ? MPG  01/08/2010  ?  105 ?(NOTE) Please note change in reference range(s).   ? ?No results found for: PROLACTIN ?Lab Results  ?Component Value Date  ? CHOL  01/08/2010  ?  140        ?ATP III CLASSIFICATION: ? <200     mg/dL   Desirable ? 517-616  mg/dL   Borderline High ? >=073    mg/dL   High ?        ? TRIG 71 01/08/2010  ? HDL 56 01/08/2010  ? CHOLHDL 2.5 01/08/2010  ? VLDL 14 01/08/2010  ? LDLCALC  01/08/2010  ?  70        ?Total Cholesterol/HDL:CHD Risk ?Coronary Heart Disease Risk Table ?                    Men   Women ? 1/2 Average Risk   3.4   3.3 ? Average Risk       5.0   4.4 ? 2 X Average Risk   9.6   7.1 ? 3 X Average Risk  23.4   11.0 ?       ?Use the calculated Patient Ratio ?above and the CHD Risk  Table ?to determine the patient's CHD Risk. ?       ?ATP III CLASSIFICATION (LDL): ? <100     mg/dL   Optimal ? 710-626  mg/dL   Near or Above ?                   Optimal ? 130-159  mg/dL   Borderline ? 160-189

## 2021-11-17 NOTE — Progress Notes (Signed)
?   11/16/21 2114  ?Psych Admission Type (Psych Patients Only)  ?Admission Status Involuntary  ?Psychosocial Assessment  ?Patient Complaints Agitation;Anxiety;Irritability  ?Eye Contact Brief  ?Facial Expression Anxious  ?Affect Anxious  ?Speech Logical/coherent;Soft  ?Associate Professor;Assertive  ?Motor Activity Other (Comment) ?(WDL)  ?Appearance/Hygiene Disheveled  ?Behavior Characteristics Cooperative;Appropriate to situation  ?Mood Anxious;Irritable  ?Thought Process  ?Coherency Tangential  ?Content Blaming self  ?Delusions None reported or observed  ?Perception WDL  ?Hallucination None reported or observed  ?Judgment Poor  ?Confusion None  ?Danger to Self  ?Current suicidal ideation? Denies  ?Danger to Others  ?Danger to Others None reported or observed  ? ? ?

## 2021-11-17 NOTE — [facility] Group Notes (Signed)
Wrap up: work on your discharge ?

## 2021-11-18 DIAGNOSIS — F332 Major depressive disorder, recurrent severe without psychotic features: Principal | ICD-10-CM

## 2021-11-18 MED ORDER — NICOTINE POLACRILEX 2 MG MT GUM: 2.0000 mg | CHEWING_GUM | OROMUCOSAL | 0 refills | Status: DC | PRN

## 2021-11-18 MED ORDER — QUETIAPINE FUMARATE 50 MG PO TABS: 50.0000 mg | ORAL_TABLET | Freq: Every day | ORAL | 0 refills | Status: DC

## 2021-11-18 MED ORDER — THIAMINE HCL 100 MG PO TABS: 100.0000 mg | ORAL_TABLET | Freq: Every day | ORAL | 0 refills | Status: DC

## 2021-11-18 MED ORDER — GABAPENTIN 300 MG PO CAPS: 300.0000 mg | ORAL_CAPSULE | Freq: Every day | ORAL | 0 refills | Status: DC

## 2021-11-18 MED ORDER — ADULT MULTIVITAMIN W/MINERALS CH: 1.0000 | ORAL_TABLET | Freq: Every day | ORAL | 0 refills | Status: DC

## 2021-11-18 MED ORDER — SERTRALINE HCL 100 MG PO TABS: 100.0000 mg | ORAL_TABLET | Freq: Every day | ORAL | 0 refills | Status: DC

## 2021-11-18 NOTE — [facility] Suicide Risk Assessment (Signed)
Suicide Risk Assessment ? ?Discharge Assessment    ?[facility] Discharge Suicide Risk Assessment ? ? ?Principal Problem: MDD (major depressive disorder), recurrent episode, severe (HCC) ?Discharge Diagnoses: Principal Problem: ?  MDD (major depressive disorder), recurrent episode, severe (HCC) ?Active Problems: ?  Nicotine dependence ?  PTSD (post-traumatic stress disorder) ?  Polysubstance abuse (HCC) ?  Alcohol abuse with alcohol-induced disorder (HCC) ?  Cocaine abuse (HCC) ? ? ?Total Time spent with patient: 30 minutes ?Patient is a 31 year old male with past psychiatric history of depression, alcohol abuse, cocaine abuse, PTSD, polysubstance use and medical history of SCID  initially presented to [facility] ED under IVC for threatening to harm himself.  ?HOSPITAL COURSE: ? ?During the patient's hospitalization, patient had extensive initial psychiatric evaluation, and follow-up psychiatric evaluations every day. ? ?Psychiatric diagnoses provided upon initial assessment:  ?MDD, severe, recurrent ?Insomnia ?PTSD ?Alcohol abuse ?Cocaine abuse ?H/o withdrawal Seizure  ?Nicotine dependence ?Patient's psychiatric medications were adjusted on admission:  ?-Started Zoloft 50 mg today and increased to 100 mg from next day for depression and anxiety ?-Started gabapentin 300 mg nightly for insomnia and anxiety. ?- Agitation protocol with Zyprexa/ativan/geodone PRN ? -Librium taper  ?-CIWA with Ativan as needed for CIWA greater than 10 ?-Continued  thiamine 100 mg daily. ?-Continued multivitamin with minerals daily. ?-Continued Zofran 4 mg every 6 hours as needed for nausea or vomiting. ?-Continued Imodium 2 to 4 mg as needed for diarrhea or loose stools for 72 hours.  ?-Ativan 2 mg IM as needed for withdrawal seizures ?-Started nicotine patch 21 mg daily. ?During the hospitalization, other adjustments were made to the patient's psychiatric medication regimen:  ?-zoloft increased to 100 mg daily ?-Librium taper completed. ?-seroquel  started at 50 mg Qhs for anxiety and sleep ?-Hydroxyzine increased  to 50 mg TID PRN for Anxiety. ? ?Patient's care was discussed during the interdisciplinary team meeting every day during the hospitalization. ? ?The patient denies having side effects to prescribed psychiatric medication. ? ?Gradually, patient started adjusting to milieu. The patient was evaluated each day by a clinical provider to ascertain response to treatment. Improvement was noted by the patient's report of decreasing symptoms, improved sleep and appetite, affect, medication tolerance, behavior, and participation in unit programming.  Patient was asked each day to complete a self inventory noting mood, mental status, pain, new symptoms, anxiety and concerns.   ?Symptoms were reported as significantly decreased or resolved completely by discharge.  ?The patient reports that their mood is stable.  ?The patient denied having suicidal thoughts for more than 48 hours prior to discharge.  Patient denies having homicidal thoughts.  Patient denies having auditory hallucinations.  Patient denies any visual hallucinations or other symptoms of psychosis.  ?The patient was motivated to continue taking medication with a goal of continued improvement in mental health.  ? ?The patient reports their target psychiatric symptoms of MDD responded well to the psychiatric medications, and the patient reports overall benefit other psychiatric hospitalization. Supportive psychotherapy was provided to the patient. The patient also participated in regular group therapy while hospitalized. Coping skills, problem solving as well as relaxation therapies were also part of the unit programming. ? ?Labs were reviewed with the patient, and abnormal results were discussed with the patient. ? ?The patient is able to verbalize their individual safety plan to this provider. ? ?# It is recommended to the patient to continue psychiatric medications as prescribed, after discharge  from the hospital.   ? ?# It is recommended to the patient  to follow up with your outpatient psychiatric provider and PCP. ? ?# It was discussed with the patient, the impact of alcohol, drugs, tobacco have been there overall psychiatric and medical wellbeing, and total abstinence from substance use was recommended the patient.ed. ? ?# Prescriptions provided or sent directly to preferred pharmacy at discharge. Patient agreeable to plan. Given opportunity to ask questions. Appears to feel comfortable with discharge.  ?  ?# In the event of worsening symptoms, the patient is instructed to call the crisis hotline, 911 and or go to the nearest ED for appropriate evaluation and treatment of symptoms. To follow-up with primary care provider for other medical issues, concerns and or health care needs. ? ?#Patient seen by this MD. At time of discharge, consistently refuted any suicidal ideation, intention or plan, denies any Self harm urges. Denies any A/VH and no delusions were elicited and does not seem to be responding to internal stimuli. During assessment the patient is able to verbalize appropriated coping skills and safety plan to use on return home. Patient verbalizes intent to be compliant with medication and outpatient services. Pt plans to attend IOP for alcohol abuse. ? ?# Patient was discharged home with a plan to follow up as noted below.  ?Musculoskeletal: ?Strength & Muscle Tone: within normal limits ?Gait & Station: normal ?Patient leans: N/A ? ?Psychiatric Specialty Exam ? ?Presentation  ?General Appearance: Appropriate for Environment ? ?Eye Contact:Fair ? ?Speech:Clear and Coherent ? ?Speech Volume:Normal ? ?Handedness:No data recorded ? ?Mood and Affect  ?Mood:Anxious ? ?Duration of Depression Symptoms: No data recorded ?Affect:Full Range ? ? ?Thought Process  ?Thought Processes:Goal Directed; Linear ? ?Descriptions of Associations:Intact ? ?Orientation:Full (Time, Place and Person) ? ?Thought  Content:Logical ? ?History of Schizophrenia/Schizoaffective disorder:No data recorded ?Duration of Psychotic Symptoms:No data recorded ?Hallucinations:Hallucinations: None ? ?Ideas of Reference:None ? ?Suicidal Thoughts:Suicidal Thoughts: No ? ?Homicidal Thoughts:Homicidal Thoughts: No ? ? ?Sensorium  ?Memory:Recent Good; Immediate Good; Remote Good ? ?Judgment:Fair ? ?Insight:Fair ? ? ?Executive Functions  ?Concentration:Fair ? ?Attention Span:Fair ? ?Recall:Fair ? ?Fund of Knowledge:Fair ? ?Language:Good ? ? ?Psychomotor Activity  ?Psychomotor Activity:Psychomotor Activity: Normal ? ? ?Assets  ?Assets:Communication Skills; Desire for Improvement; Housing; Social Support ? ? ?Sleep  ?Sleep:Sleep: Good ?Number of Hours of Sleep: 0 (Not charted) ? ? ?Physical Exam: ?Physical Exam see d/c summay ?ROS see d/c summay ?Blood pressure 121/75, pulse 89, temperature 98.5 ?F (36.9 ?C), temperature source Oral, resp. rate 18, height 5\' 10"  (1.778 m), weight 64.9 kg, SpO2 100 %. Body mass index is 20.52 kg/m?. ? ?Mental Status Per Nursing Assessment::   ?On Admission:    ?Demographic factors:  Male, Caucasian ?Current Mental Status:   denies SI, HI, AVH ?Loss Factors:    ?Historical Factors:  Prior suicide attempts (Pt reports last suicide attempt was age 46.)  ?Risk Reduction Factors:   ?Sense of responsibility to family, Living with another person, especially a relative, Positive social support, and Positive therapeutic relationship ? ?Continued Clinical Symptoms:  ?Dysthymia ?Alcohol/Substance Abuse/Dependencies ?Previous Psychiatric Diagnoses and Treatments ? ?Cognitive Features That Contribute To Risk:  ?Closed-mindedness and Thought constriction (tunnel vision)   ? ?Suicide Risk:  ?Mild:  Suicidal ideation of limited frequency, intensity, duration, and specificity.  There are no identifiable plans, no associated intent, mild dysphoria and related symptoms, good self-control (both objective and subjective assessment), few  other risk factors, and identifiable protective factors, including available and accessible social support. ? ? Follow-up Information   ? ? [facility] Follow up  on 11/27/2021.   ?Why: You have an appo

## 2021-11-18 NOTE — Progress Notes (Signed)
Pt discharged to lobby where mother was present to transport pt home. Pt was stable and appreciative at that time. All papers, samples, and prescriptions were given and valuables returned. Verbal understanding expressed. Denies SI/HI and A/VH. Pt given opportunity to express concerns and ask questions.  ?

## 2021-11-18 NOTE — Progress Notes (Signed)
?  [facility] Adult Case Management Discharge Plan : ? ?Will you be returning to the same living situation after discharge:  Yes,  Parents Home  ?At discharge, do you have transportation home?: Yes,  Parents  ?Do you have the ability to pay for your medications: Yes,  Family  ? ?Release of information consent forms completed and in the chart;  Patient's signature needed at discharge. ? ?Patient to Follow up at: ? Follow-up Information   ? ? [facility] Follow up on 11/27/2021.   ?Why: You have an appointment for medication management services on 11/27/21 at 10:00 am.   This will be a Virtual appointment. ?Contact information: ?[address], [place], [place] [address] ? ?Phone: [phone] ? ?  ?  ? ? [place].   ?Specialty: Behavioral Health ?Why: Please go to this provider for therapy services during walk in hours:  Mondays and Wednesdays at 57;30 am.  (Services are provided on a first come, first served basis so please arrive early) ?Contact information: ?[address] ?[place] [address] ?[phone] ? ?  ?  ? ?  ?  ? ?  ? ? ?Next level of care provider has access to [place] ? ?Safety Planning and Suicide Prevention discussed: Yes,  with patient and father  ? ?  ? ?Has patient been referred to the Quitline?: N/A patient is not a smoker ? ?Patient has been referred for addiction treatment: Pt. refused referral ? ?Darleen Crocker, LCSWA ?11/18/2021, 9:35 AM ?

## 2021-11-18 NOTE — [facility] Suicide Risk Assessment (Signed)
[facility] INPATIENT:  Family/Significant Other Suicide Prevention Education ? ?Suicide Prevention Education:  ?Education Completed; Feliz Wixon [phone] (Father) has been identified by the patient as the family member/significant other with whom the patient will be residing, and identified as the person(s) who will aid the patient in the event of a mental health crisis (suicidal ideations/suicide attempt).  With written consent from the patient, the family member/significant other has been provided the following suicide prevention education, prior to the and/or following the discharge of the patient. ? ?The suicide prevention education provided includes the following: ?Suicide risk factors ?Suicide prevention and interventions ?National Suicide Hotline telephone number ?[facility] Psychiatric assessment telephone number ?[facility] Emergency Assistance 911 ?[place] and/or Residential Mobile Crisis Unit telephone number ? ?Request made of family/significant other to: ?Remove weapons (e.g., guns, rifles, knives), all items previously/currently identified as safety concern.   ?Remove drugs/medications (over-the-counter, prescriptions, illicit drugs), all items previously/currently identified as a safety concern. ? ?The family member/significant other verbalizes understanding of the suicide prevention education information provided.  The family member/significant other agrees to remove the items of safety concern listed above. ? ?CSW spoke with Mr. Pietrantonio who states that he is aware of what brought his son to the hospital and has spoken with his son since he was admitted to the hospital.  He confirms that his son can return to the home after discharge and states that the firearms in the home are locked up and his son has no access to them.  Mr. Haris states that he has no other concerns or questions at this time.  CSW completed SPE with Mr. Raygoza.  ? ?Darleen Crocker ?11/18/2021, 9:30 AM ?

## 2021-11-18 NOTE — Group Note (Signed)
Date:  11/18/2021 ?Time:  10:30 AM ? ?Group Topic/Focus:  ?Orientation:   The focus of this group is to educate the patient on the purpose and policies of crisis stabilization and provide a format to answer questions about their admission.  The group details unit policies and expectations of patients while admitted. ? ? ? ?Participation Level:  Active ? ?Participation Quality:  Appropriate ? ?Affect:  Appropriate ? ?Cognitive:  Appropriate ? ?Insight: Appropriate ? ?Engagement in Group:  Engaged ? ?Modes of Intervention:  Discussion ? ?Additional Comments:   ? ?Reymundo Poll ?11/18/2021, 10:30 AM ? ?

## 2021-11-18 NOTE — Progress Notes (Signed)
?   11/17/21 2104  ?Psych Admission Type (Psych Patients Only)  ?Admission Status Involuntary  ?Psychosocial Assessment  ?Patient Complaints Agitation  ?Eye Contact Brief  ?Facial Expression Anxious;Animated  ?Affect Anxious  ?Speech Logical/coherent  ?Interaction Assertive;Demanding;Manipulative  ?Motor Activity Other (Comment) ?(WDL)  ?Appearance/Hygiene Disheveled  ?Behavior Characteristics Cooperative;Appropriate to situation  ?Mood Anxious  ?Thought Process  ?Coherency Tangential  ?Content Blaming self  ?Delusions None reported or observed  ?Perception WDL  ?Hallucination None reported or observed  ?Judgment Poor  ?Confusion None  ?Danger to Self  ?Current suicidal ideation? Denies  ?Danger to Others  ?Danger to Others None reported or observed  ? ? ?

## 2021-11-18 NOTE — [facility] Group Notes (Signed)
[facility] Group Notes:  (Nursing/MHT/Case Management/Adjunct) ? ?Date:  11/18/2021  ?Time:  11:02 AM ? ?Type of Therapy:  Psychoeducational Skills ? ?Participation Level:  Active ? ?Participation Quality:  Appropriate ? ?Affect:  Appropriate ? ?Cognitive:  Appropriate ? ?Insight:  Appropriate ? ?Engagement in Group:  Engaged ? ?Modes of Intervention:  Education ? ?Summary of Progress/Problems:Educated patient on emotional wellness involves recognizing that emotions are a nature part of life, and they are ok as long as we can manage our emotions in a mindful way.  ? ?Ruben [name] ?11/18/2021, 11:02 AM ?

## 2021-11-18 NOTE — Progress Notes (Signed)
?   11/18/21 1100  ?Precautions / Armbands  ?Precautions Other (Comment) ?(level 3 observation)  ?Patient armbands applied: Patient Identification (White)  ?[facility] Fall Risk Assessment  ?Age 31  ?Mental Status 0  ?Physical Satus 0  ?Elimination 0  ?Sensory Impairments 0  ?Gait or Balance 0  ?History or falls in past 6 months 0  ?Mood Stabilizer Medications 0  ?Benzodiazepines 2  ?Narcotics 0  ?Sedatives/Hypnotics 1  ?Atypical Anti Psychotics 1  ?Detox Protocol (Alcohol, Narcotics, etc.) 7  ?Total Score 11  ?Patient Fall Risk Level High fall risk  ?Required Bundle Interventions *See Row Information* High fall risk - low and high requirements implemented  ?Screening for Fall Injury Risk (To be completed on HIGH fall risk patients) - Assessing Need for Floor Mats  ?Risk For Fall Injury- Criteria for Floor Mats None identified - No additional interventions needed  ?Safe Patient Handling Required Documentation-Repositioning Needs  ?Assist with movement in bed No  ?Fragile skin with pressure ulcer No  ?Unresponsive No  ?Safe Patient Handling Assessment  ?Ambulates independently Yes, no lift needed  ?Safety Interventions  ?Less Restrictive Interventions Active listening  ?Safe Environment  ?Patient oriented to unit and equipment Yes  ? ? ?

## 2021-11-18 NOTE — Discharge Summary (Addendum)
Physician Discharge Summary Note ? ?Patient:  Ruben [name] is an 31 y.o., male ?MRN:  [id number] ?DOB:  07/04/1991 ?Patient phone:  [phone] (home)  ?Patient address:   ?[address] ?[place] [id number],  ?Total Time spent with patient: 30 minutes ? ?Date of Admission:  11/14/2021 ?Date of Discharge: 11/18/21 ? ?Reason for Admission:  Patient is a 31 year old male with past psychiatric history of depression, alcohol abuse, cocaine abuse, PTSD, polysubstance use and medical history of SCID  [facility] for threatening to harm himself.  ? ?Principal Problem: MDD (major depressive disorder), recurrent episode, severe (HCC) ?Discharge Diagnoses: Principal Problem: ?  MDD (major depressive disorder), recurrent episode, severe (HCC) ?Active Problems: ?  Nicotine dependence ?  PTSD (post-traumatic stress disorder) ?  Polysubstance abuse (HCC) ?  Alcohol abuse with alcohol-induced disorder (HCC) ?  Cocaine abuse (HCC) ? ? ?Past Psychiatric History:  h/o Depression, Alcohol abuse, Overdose, PTSD, Polysubstance abuse, cocaine abuse ?Chart review shows that Pt was admitted to inpatient unit in 12/2009 for SA by taking 3 handful of Benadryl, SI, Mood disorder, Dangerous disruptive behavior and substance abuse. ? ?Past Medical History:  ?Past Medical History:  ?Diagnosis Date  ? ADHD (attention deficit hyperactivity disorder)   ? Anxiety   ? Depression   ? Suicide attempt [facility](HCC)   ?  ?Past Surgical History:  ?Procedure Laterality Date  ? DENTAL SURGERY    ? ?Family History:  ?Family History  ?Problem Relation Age of Onset  ? Hypertension Father   ? Cancer Paternal Grandfather   ? ?Family Psychiatric  History: unknown ?Social History:  ?Social History  ? ?Substance and Sexual Activity  ?Alcohol Use Yes  ?   ?Social History  ? ?Substance and Sexual Activity  ?Drug Use Yes  ? Types: Marijuana  ?  ?Social History  ? ?Socioeconomic History  ? Marital status: Single  ?  Spouse name: Not on file   ? Number of children: Not on file  ? Years of education: Not on file  ? Highest education level: Not on file  ?Occupational History  ? Not on file  ?Tobacco Use  ? Smoking status: Former  ?  Packs/day: 1.00  ?  Types: Cigarettes  ? Smokeless tobacco: Never  ?Vaping Use  ? Vaping Use: Every day  ? Substances: Nicotine, Flavoring  ?Substance and Sexual Activity  ? Alcohol use: Yes  ? Drug use: Yes  ?  Types: Marijuana  ? Sexual activity: Not on file  ?Other Topics Concern  ? Not on file  ?Social History Narrative  ? Not on file  ? ?Social Determinants of Health  ? ?Financial Resource Strain: Not on file  ?Food Insecurity: Not on file  ?Transportation Needs: Not on file  ?Physical Activity: Not on file  ?Stress: Not on file  ?Social Connections: Not on file  ? ? ?Hospital Course:  Patient is a 31 year old male with past psychiatric history of depression, alcohol abuse, cocaine abuse, PTSD, polysubstance use and medical history of SCID  [facility] for threatening to harm himself.  ?HOSPITAL COURSE: ?  ?During the patient's hospitalization, patient had extensive initial psychiatric evaluation, and follow-up psychiatric evaluations every day. ?  ?Psychiatric diagnoses provided upon initial assessment:  ?MDD, severe, recurrent ?Insomnia ?PTSD ?Alcohol abuse ?Cocaine abuse ?H/o withdrawal Seizure  ?Nicotine dependence ?Patient's psychiatric medications were adjusted on admission:  ?-Started Zoloft 50 mg today and increased to 100 mg from next day  for depression and anxiety ?-Started gabapentin 300 mg nightly for insomnia and anxiety. ?- Agitation protocol with Zyprexa/ativan/geodone PRN ? -Librium taper  ?-CIWA with Ativan as needed for CIWA greater than 10 ?-Continued  thiamine 100 mg daily. ?-Continued multivitamin with minerals daily. ?-Continued Zofran 4 mg every 6 hours as needed for nausea or vomiting. ?-Continued Imodium 2 to 4 mg as needed for diarrhea or loose stools for 72 hours.   ?-Ativan 2 mg IM as needed for withdrawal seizures ?-Started nicotine patch 21 mg daily. ?During the hospitalization, other adjustments were made to the patient's psychiatric medication regimen:  ?-zoloft increased to 100 mg daily ?-Librium taper completed. ?-seroquel started at 50 mg Qhs for anxiety and sleep ?-Hydroxyzine increased  to 50 mg TID PRN for Anxiety. ?  ?Patient's care was discussed during the interdisciplinary team meeting every day during the hospitalization. ?  ?The patient denies having side effects to prescribed psychiatric medication. ?  ?Gradually, patient started adjusting to milieu. The patient was evaluated each day by a clinical provider to ascertain response to treatment. Improvement was noted by the patient's report of decreasing symptoms, improved sleep and appetite, affect, medication tolerance, behavior, and participation in unit programming.  Patient was asked each day to complete a self inventory noting mood, mental status, pain, new symptoms, anxiety and concerns.   ?Symptoms were reported as significantly decreased or resolved completely by discharge.  ?The patient reports that their mood is stable.  ?The patient denied having suicidal thoughts for more than 48 hours prior to discharge.  Patient denies having homicidal thoughts.  Patient denies having auditory hallucinations.  Patient denies any visual hallucinations or other symptoms of psychosis.  ?The patient was motivated to continue taking medication with a goal of continued improvement in mental health.  ?  ?The patient reports their target psychiatric symptoms of MDD responded well to the psychiatric medications, and the patient reports overall benefit other psychiatric hospitalization. Supportive psychotherapy was provided to the patient. The patient also participated in regular group therapy while hospitalized. Coping skills, problem solving as well as relaxation therapies were also part of the unit programming. ?  ?Labs  were reviewed with the patient, and abnormal results were discussed with the patient. ?  ?The patient is able to verbalize their individual safety plan to this provider. ?  ?# It is recommended to the patient to continue psychiatric medications as prescribed, after discharge from the hospital.   ?  ?# It is recommended to the patient to follow up with your outpatient psychiatric provider and PCP. ?  ?# It was discussed with the patient, the impact of alcohol, drugs, tobacco have been there overall psychiatric and medical wellbeing, and total abstinence from substance use was recommended the patient.ed. ?  ?# Prescriptions provided or sent directly to preferred pharmacy at discharge. Patient agreeable to plan. Given opportunity to ask questions. Appears to feel comfortable with discharge.  ?  ?# In the event of worsening symptoms, the patient is instructed to call the crisis hotline, 911 and or go to the nearest ED for appropriate evaluation and treatment of symptoms. To follow-up with primary care provider for other medical issues, concerns and or health care needs. ?  ?#Patient seen by this MD. At time of discharge, consistently refuted any suicidal ideation, intention or plan, denies any Self harm urges. Denies any A/VH and no delusions were elicited and does not seem to be responding to internal stimuli. During assessment the patient is able to verbalize  appropriated coping skills and safety plan to use on return home. Patient verbalizes intent to be compliant with medication and outpatient services. Pt plans to attend IOP for alcohol abuse. ?  ?# Patient was discharged home with a plan to follow up as noted below.  ? ?Physical Findings: ?AIMS:  , ,  ,  ,    ?CIWA:  CIWA-Ar Total: 0 ?COWS:  COWS Total Score: 0 ? ?Musculoskeletal: ?Strength & Muscle Tone: within normal limits ?Gait & Station: normal ?Patient leans: N/A ? ? ?Psychiatric Specialty Exam: ? ?Presentation  ?General Appearance: Appropriate for  Environment ? ?Eye Contact:Fair ? ?Speech:Clear and Coherent ? ?Speech Volume:Normal ? ?Handedness:No data recorded ? ?Mood and Affect  ?Mood:Anxious ? ?Affect:Full Range ? ? ?Thought Process  ?Thought Processes:[place]

## 2021-11-18 NOTE — [facility] Suicide Risk Assessment (Signed)
[facility] INPATIENT:  Family/Significant Other Suicide Prevention Education ? ?Suicide Prevention Education:  ?Contact Attempts: Ruben [name] [phone] (Girlfriend) has been identified by the patient as the family member/significant other with whom the patient will be residing, and identified as the person(s) who will aid the patient in the event of a mental health crisis.  With written consent from the patient, two attempts were made to provide suicide prevention education, prior to and/or following the patient's discharge.  We were unsuccessful in providing suicide prevention education.  A suicide education pamphlet was given to the patient to share with family/significant other. ? ?Date and time of first attempt: 11/17/2021 at 9:33am and 2:10pm  ?Date and time of second attempt: 11/18/2021 at 9:21am ? ?Ruben [name] ?11/18/2021, 9:21 AM ?

## 2021-11-28 ENCOUNTER — Other Ambulatory Visit: Payer: Self-pay

## 2021-11-28 ENCOUNTER — Emergency Department (HOSPITAL_COMMUNITY)
Admission: EM | Admit: 2021-11-28 | Discharge: 2021-11-28 | Disposition: A | Discharge status: Home / Self Care | Payer: Medicaid Other | Attending: Emergency Medicine | Admitting: Emergency Medicine

## 2021-11-28 DIAGNOSIS — R2243 Localized swelling, mass and lump, lower limb, bilateral: Secondary | ICD-10-CM | POA: Insufficient documentation

## 2021-11-28 DIAGNOSIS — L0390 Cellulitis, unspecified: Secondary | ICD-10-CM

## 2021-11-28 DIAGNOSIS — S0001XA Abrasion of scalp, initial encounter: Secondary | ICD-10-CM | POA: Insufficient documentation

## 2021-11-28 DIAGNOSIS — Z79899 Other long term (current) drug therapy: Secondary | ICD-10-CM | POA: Insufficient documentation

## 2021-11-28 DIAGNOSIS — R000 Tachycardia, unspecified: Secondary | ICD-10-CM | POA: Insufficient documentation

## 2021-11-28 DIAGNOSIS — E871 Hypo-osmolality and hyponatremia: Secondary | ICD-10-CM | POA: Insufficient documentation

## 2021-11-28 DIAGNOSIS — X789XXA Intentional self-harm by unspecified sharp object, initial encounter: Secondary | ICD-10-CM | POA: Insufficient documentation

## 2021-11-28 DIAGNOSIS — E876 Hypokalemia: Secondary | ICD-10-CM | POA: Insufficient documentation

## 2021-11-28 DIAGNOSIS — D649 Anemia, unspecified: Secondary | ICD-10-CM | POA: Insufficient documentation

## 2021-11-28 DIAGNOSIS — S80812A Abrasion, left lower leg, initial encounter: Secondary | ICD-10-CM | POA: Insufficient documentation

## 2021-11-28 DIAGNOSIS — S40811A Abrasion of right upper arm, initial encounter: Secondary | ICD-10-CM | POA: Insufficient documentation

## 2021-11-28 DIAGNOSIS — S1091XA Abrasion of unspecified part of neck, initial encounter: Secondary | ICD-10-CM | POA: Insufficient documentation

## 2021-11-28 DIAGNOSIS — S40812A Abrasion of left upper arm, initial encounter: Secondary | ICD-10-CM | POA: Insufficient documentation

## 2021-11-28 DIAGNOSIS — S80811A Abrasion, right lower leg, initial encounter: Secondary | ICD-10-CM | POA: Insufficient documentation

## 2021-11-28 DIAGNOSIS — F419 Anxiety disorder, unspecified: Secondary | ICD-10-CM | POA: Insufficient documentation

## 2021-11-28 DIAGNOSIS — S0081XA Abrasion of other part of head, initial encounter: Secondary | ICD-10-CM | POA: Insufficient documentation

## 2021-11-28 DIAGNOSIS — H6011 Cellulitis of right external ear: Secondary | ICD-10-CM | POA: Insufficient documentation

## 2021-11-28 LAB — CBC WITH DIFFERENTIAL/PLATELET
Abs Immature Granulocytes: 0.02 10*3/uL (ref 0.00–0.07)
Basophils Absolute: 0 10*3/uL (ref 0.0–0.1)
Basophils Relative: 0 %
Eosinophils Absolute: 0.2 10*3/uL (ref 0.0–0.5)
Eosinophils Relative: 2 %
HCT: 33.4 % — ABNORMAL LOW (ref 39.0–52.0)
Hemoglobin: 11.8 g/dL — ABNORMAL LOW (ref 13.0–17.0)
Immature Granulocytes: 0 %
Lymphocytes Relative: 20 %
Lymphs Abs: 1.4 10*3/uL (ref 0.7–4.0)
MCH: 32.1 pg (ref 26.0–34.0)
MCHC: 35.3 g/dL (ref 30.0–36.0)
MCV: 90.8 fL (ref 80.0–100.0)
Monocytes Absolute: 0.7 10*3/uL (ref 0.1–1.0)
Monocytes Relative: 10 %
Neutro Abs: 4.7 10*3/uL (ref 1.7–7.7)
Neutrophils Relative %: 68 %
Platelets: 251 10*3/uL (ref 150–400)
RBC: 3.68 MIL/uL — ABNORMAL LOW (ref 4.22–5.81)
RDW: 12 % (ref 11.5–15.5)
WBC: 6.9 10*3/uL (ref 4.0–10.5)
nRBC: 0 % (ref 0.0–0.2)

## 2021-11-28 LAB — COMPREHENSIVE METABOLIC PANEL
ALT: 24 U/L (ref 0–44)
AST: 35 U/L (ref 15–41)
Albumin: 3.7 g/dL (ref 3.5–5.0)
Alkaline Phosphatase: 63 U/L (ref 38–126)
Anion gap: 9 (ref 5–15)
BUN: 11 mg/dL (ref 6–20)
CO2: 26 mmol/L (ref 22–32)
Calcium: 8.9 mg/dL (ref 8.9–10.3)
Chloride: 99 mmol/L (ref 98–111)
Creatinine, Ser: 0.75 mg/dL (ref 0.61–1.24)
GFR, Estimated: >60 mL/min (ref >60)
Glucose, Bld: 97 mg/dL (ref 70–99)
Potassium: 3.2 mmol/L — ABNORMAL LOW (ref 3.5–5.1)
Sodium: 134 mmol/L — ABNORMAL LOW (ref 135–145)
Total Bilirubin: 0.5 mg/dL (ref 0.3–1.2)
Total Protein: 6.6 g/dL (ref 6.5–8.1)

## 2021-11-28 LAB — BRAIN NATRIURETIC PEPTIDE: B Natriuretic Peptide: 22.8 pg/mL (ref 0.0–100.0)

## 2021-11-28 LAB — CK: Total CK: 307 U/L (ref 49–397)

## 2021-11-28 LAB — RAPID URINE DRUG SCREEN, HOSP PERFORMED
Amphetamines: NOT DETECTED
Barbiturates: NOT DETECTED
Benzodiazepines: POSITIVE — AB
Cocaine: POSITIVE — AB
Opiates: NOT DETECTED
Tetrahydrocannabinol: POSITIVE — AB

## 2021-11-28 LAB — ETHANOL: Alcohol, Ethyl (B): 95 mg/dL — ABNORMAL HIGH (ref <10)

## 2021-11-28 LAB — URINALYSIS, ROUTINE W REFLEX MICROSCOPIC
Bilirubin Urine: NEGATIVE
Glucose, UA: NEGATIVE mg/dL
Ketones, ur: NEGATIVE mg/dL
Leukocytes,Ua: NEGATIVE
Nitrite: NEGATIVE
Protein, ur: 30 mg/dL — AB
RBC / HPF: >50 RBC/hpf — ABNORMAL HIGH (ref 0–5)
Specific Gravity, Urine: 1.024 (ref 1.005–1.030)
pH: 6 (ref 5.0–8.0)

## 2021-11-28 LAB — LACTIC ACID, PLASMA
Lactic Acid, Venous: 2 mmol/L (ref 0.5–1.9)
Lactic Acid, Venous: 2.4 mmol/L (ref 0.5–1.9)

## 2021-11-28 LAB — TROPONIN I (HIGH SENSITIVITY)
Troponin I (High Sensitivity): 2 ng/L (ref <18)
Troponin I (High Sensitivity): 3 ng/L (ref <18)

## 2021-11-28 LAB — SALICYLATE LEVEL: Salicylate Lvl: <7 mg/dL — ABNORMAL LOW (ref 7.0–30.0)

## 2021-11-28 LAB — ACETAMINOPHEN LEVEL: Acetaminophen (Tylenol), Serum: <10 ug/mL — ABNORMAL LOW (ref 10–30)

## 2021-11-28 MED ORDER — MUPIROCIN CALCIUM 2 % EX CREA: 1.0000 "application " | TOPICAL_CREAM | Freq: Two times a day (BID) | CUTANEOUS | 0 refills | Status: DC

## 2021-11-28 MED ORDER — SULFAMETHOXAZOLE-TRIMETHOPRIM 800-160 MG PO TABS: 1.0000 | ORAL_TABLET | Freq: Two times a day (BID) | ORAL | 0 refills | Status: AC

## 2021-11-28 MED ORDER — LACTATED RINGERS IV BOLUS
1000.0000 mL | Freq: Once | INTRAVENOUS | Status: AC
  Administered 2021-11-28: 1000 mL via INTRAVENOUS

## 2021-11-28 MED ORDER — SULFAMETHOXAZOLE-TRIMETHOPRIM 800-160 MG PO TABS
1.0000 | ORAL_TABLET | Freq: Once | ORAL | Status: AC
  Administered 2021-11-28: 1 via ORAL
  Filled 2021-11-28: qty 1

## 2021-11-28 NOTE — ED Triage Notes (Signed)
BIB EMS from home for bilateral swollen ankles, anxiety, mania, wounds to face, girlfriend broke up with him a few weeks ago and he is spiraling ?

## 2021-11-28 NOTE — Discharge Instructions (Addendum)
Please take the prescribed antibiotics for the entire course to treat potential infection of your skin wounds. Please stop picking at your skin.  ?

## 2021-11-28 NOTE — ED Provider Notes (Signed)
?[facility]-EMERGENCY DEPT ?Provider Note ? ? ?CSN: [id number] ?Arrival date & time: 11/28/21  0158 ? ?  ? ?History ? ?No chief complaint on file. ? ? ?Ruben [name] is a 31 y.o. male with history of severe recurrent MDD, PTSD, and polysubstance abuse who was recently admitted to behavioral health under IVC for suicidal intent who presents to the emergency department this evening with multiple concerns.  Patient does appear to be quite anxious at time of presentation to the emergency department.  Has multiple skin abrasions over his face, scalp, neck, arms, and legs, per patient self-inflicted as an outlet for his anxiety.  He states he picks at his skin.  Additionally endorses some areas on his toes where he had blisters that he picked open now with redness and swelling to his right leg.  He is endorsing decreased regard for his hygiene and eating and drinking.  He has not had much to drink as far as normal liquids in the last week since his discharge from behavioral health. ?Admitted to [facility] patient was started on Zoloft 100 mg, nightly gabapentin as needed for insomnia, Seroquel, and hydroxyzine. ? ?Patient adamantly denies any suicidal thoughts, homicidal thoughts, or AVH at this time.  States he is feeling better after his recent admission to the hospital for his suicidality.  Who presents this evening primarily with concern for possible infection to the areas of his skin that he has picked with increase in his anxiety since his recent admission.  States he has been taking some of his father's leftover amoxicillin without improvement for the last 48 hours ? ?HPI ? ?  ? ?Home Medications ?Prior to Admission medications   ?Medication Sig Start Date End Date Taking? Authorizing Provider  ?albuterol (VENTOLIN HFA) 108 (90 Base) MCG/ACT inhaler Inhale 1 puff into the lungs every 6 (six) hours as needed for wheezing or shortness of breath.    [provider]   ?amphetamine-dextroamphetamine (ADDERALL XR) 30 MG 24 hr capsule Take 30 mg by mouth daily as needed (ADD). 12/22/20   [provider]  ?gabapentin (NEURONTIN) 300 MG capsule Take 1 capsule (300 mg total) by mouth at bedtime. 11/18/21   Karsten Ro, MD  ?Multiple Vitamin (MULTIVITAMIN WITH MINERALS) TABS tablet Take 1 tablet by mouth daily. 11/19/21   Karsten Ro, MD  ?nicotine polacrilex (NICORETTE) 2 MG gum Take 1 each (2 mg total) by mouth as needed for smoking cessation. 11/18/21   Karsten Ro, MD  ?QUEtiapine (SEROQUEL) 50 MG tablet Take 1 tablet (50 mg total) by mouth at bedtime. 11/18/21   Karsten Ro, MD  ?sertraline (ZOLOFT) 100 MG tablet Take 1 tablet (100 mg total) by mouth daily. 11/19/21   Karsten Ro, MD  ?thiamine 100 MG tablet Take 1 tablet (100 mg total) by mouth daily. 11/19/21   Karsten Ro, MD  ?   ? ?Allergies    ?Abilify [aripiprazole]   ? ?Review of Systems   ?Review of Systems  ?Constitutional: Negative.   ?HENT: Negative.    ?Respiratory: Negative.    ?Cardiovascular:  Positive for leg swelling.  ?Genitourinary: Negative.   ?Skin:  Positive for wound.  ?Neurological: Negative.   ?Psychiatric/Behavioral:  Positive for agitation and sleep disturbance. Negative for suicidal ideas. The patient is nervous/anxious.   ? ?Physical Exam ?Updated Vital Signs ?BP 131/84 (BP Location: Right Arm)   Pulse (!) 148   Temp 98.6 ?F (37 ?C) (Oral)   Resp 16   SpO2 100%  ?Physical  Exam ?Vitals and nursing note reviewed.  ?Constitutional:   ?   Appearance: He is not ill-appearing or toxic-appearing.  ?HENT:  ?   Head: Normocephalic and atraumatic.  ? ?   Nose: Nose normal.  ?   Mouth/Throat:  ?   Mouth: Mucous membranes are moist.  ?   Pharynx: No oropharyngeal exudate or posterior oropharyngeal erythema.  ?Eyes:  ?   General:     ?   Right eye: No discharge.     ?   Left eye: No discharge.  ?   Extraocular Movements: Extraocular movements intact.  ?   Conjunctiva/sclera: Conjunctivae normal.   ?   Pupils: Pupils are equal, round, and reactive to light.  ?Cardiovascular:  ?   Rate and Rhythm: Regular rhythm. Tachycardia present.  ?   Pulses: Normal pulses.  ?   Heart sounds: Normal heart sounds. No murmur heard. ?   Comments: Heart rate in the 150s at time of initial evaluation regular rhythm. ?Pulmonary:  ?   Effort: Pulmonary effort is normal. No tachypnea, bradypnea, accessory muscle usage, prolonged expiration or respiratory distress.  ?   Breath sounds: Normal breath sounds. No wheezing or rales.  ?Chest:  ?   Chest wall: No mass, deformity, crepitus or edema.  ?Abdominal:  ?   General: Bowel sounds are normal. There is no distension.  ?   Palpations: Abdomen is soft.  ?   Tenderness: There is no abdominal tenderness. There is no right CVA tenderness, left CVA tenderness, guarding or rebound.  ?Musculoskeletal:     ?   General: No deformity.  ?   Cervical back: Neck supple.  ?   Right lower leg: No edema.  ?   Left lower leg: No edema.  ?     Feet: ? ?   Comments:  No calf tenderness to palpation ?  ?Skin: ?   General: Skin is warm and dry.  ?   Capillary Refill: Capillary refill takes less than 2 seconds.  ? ?    ?Neurological:  ?   General: No focal deficit present.  ?   Mental Status: He is alert and oriented to person, place, and time. Mental status is at baseline.  ?Psychiatric:     ?   Attention and Perception: Attention normal.     ?   Mood and Affect: Mood is anxious.     ?   Speech: Speech is rapid and pressured and tangential.     ?   Behavior: Behavior is cooperative.     ?   Thought Content: Thought content is not paranoid or delusional. Thought content does not include homicidal or suicidal ideation.  ? ? ?ED Results / Procedures / Treatments   ?Labs ?(all labs ordered are listed, but only abnormal results are displayed) ?Labs Reviewed - No data to display ? ?EKG ?None ? ?Radiology ?No results found. ? ?Procedures ?Procedures  ? ? ?Medications Ordered in ED ?Medications - No data to  display ? ?ED Course/ Medical Decision Making/ A&P ?  ?                        ?Medical Decision Making ?31 year old male with history of multiple mental health concerns and suicidal attempt in the past who presents with concern for multiple skin injuries and possible early infection. ? ?Tachycardic to the 140s, hypertensive, and tachypneic on intake.  Cardiopulmonary and abdominal exam are benign.  Skin exam as above  with multiple areas of self-inflicted injuries now scabbed with changes concerning for cellulitis over the right ear.  Bilateral lower extremities with 1+ edema without cellulitic changes.  Clinically patient appears significantly dehydrated consistent with recent episode of major depressive disorder. ? ? ?Amount and/or Complexity of Data Reviewed ?Labs: ordered. ?   Details: CBC without leukocytosis and with mild anemia with hemoglobin of 11.8 CMP with mild hyponatremia of 134 and hypokalemia of 3.2.  Ingestion labs with acetaminophen and salicylate are normal.  Alcohol elevated to 95.  UA with large hemoglobin, protein, and greater than 50 red blood cells.  Lactic acid initially 2 then elevated to 2.4 despite fluid bolus of 1 L.  Patient has received second bolus now and is receiving third bolus UDS positive for cocaine, benzos, and cannabinoids.  BNP is normal, troponin is negative.  CK pending at time of shift change ? ?Risk ?Prescription drug management. ? ? ?Heart rate improving now 108 following second fluid bolus.  Patient to receive third liter while in the emergency department; pending CK to rule out rhabdomyolysis given large amount of hematuria and clinically significantly dehydrated patient.  Care of this patient signed out to oncoming ED provider Arthor CaptainAbigail Harris, PA-C at time of shift change.  All pertinent HPI, physical exam, laboratory findings were discussed with her prior to [facility].  Disposition pending remainder of laboratory studies and reevaluation. ? ?Weston Brassick  voiced understanding  of her medical evaluation and treatment plan. Each of their questions answered to their expressed satisfaction. ? ?This chart was dictated using voice recognition software, Dragon. Despite the best efforts of this provider to proofread

## 2021-11-28 NOTE — ED Provider Notes (Signed)
Assumed care of pt at shift change from [facility]. ?Patient came to the ED for panic attack over concern for skin infection from skin picking. ?+ cellulitis of the R ear. ?Very dry and + for multiple intoxicants. ?Lactate: 2.0, 2.4 ?3L bolus ? UA + and pos for hgb, hematuria ?Awaiting Ck level. ? ?Patient CK level has returned.  Will discharge with Bactrim per plan from previous PA.  Patient given outpatient resources for polysubstance abuse. ? ? ?  ?Arthor Captain, PA-C ?11/28/21 0820 ? ?  ?Ernie Avena, MD ?11/28/21 1043 ? ?

## 2021-12-01 ENCOUNTER — Emergency Department (HOSPITAL_COMMUNITY): Payer: Self-pay

## 2021-12-01 ENCOUNTER — Encounter (HOSPITAL_COMMUNITY): Payer: Self-pay

## 2021-12-01 ENCOUNTER — Other Ambulatory Visit: Payer: Self-pay

## 2021-12-01 ENCOUNTER — Emergency Department (HOSPITAL_COMMUNITY)
Admission: EM | Admit: 2021-12-01 | Discharge: 2021-12-01 | Discharge status: Against Medical Advice | Payer: Self-pay | Attending: Emergency Medicine | Admitting: Emergency Medicine

## 2021-12-01 DIAGNOSIS — R0602 Shortness of breath: Secondary | ICD-10-CM | POA: Insufficient documentation

## 2021-12-01 DIAGNOSIS — Z5321 Procedure and treatment not carried out due to patient leaving prior to being seen by health care provider: Secondary | ICD-10-CM | POA: Insufficient documentation

## 2021-12-01 DIAGNOSIS — R079 Chest pain, unspecified: Secondary | ICD-10-CM | POA: Insufficient documentation

## 2021-12-01 DIAGNOSIS — L089 Local infection of the skin and subcutaneous tissue, unspecified: Secondary | ICD-10-CM | POA: Insufficient documentation

## 2021-12-01 IMAGING — CR DG ANKLE COMPLETE 3+V*R*
3 series · 3 of 3 positions shown · non-contrast
Comparison: None Available.

CLINICAL DATA: Pain and swelling.

EXAM:
RIGHT ANKLE - COMPLETE 3+ VIEW

[x ankle ap right]
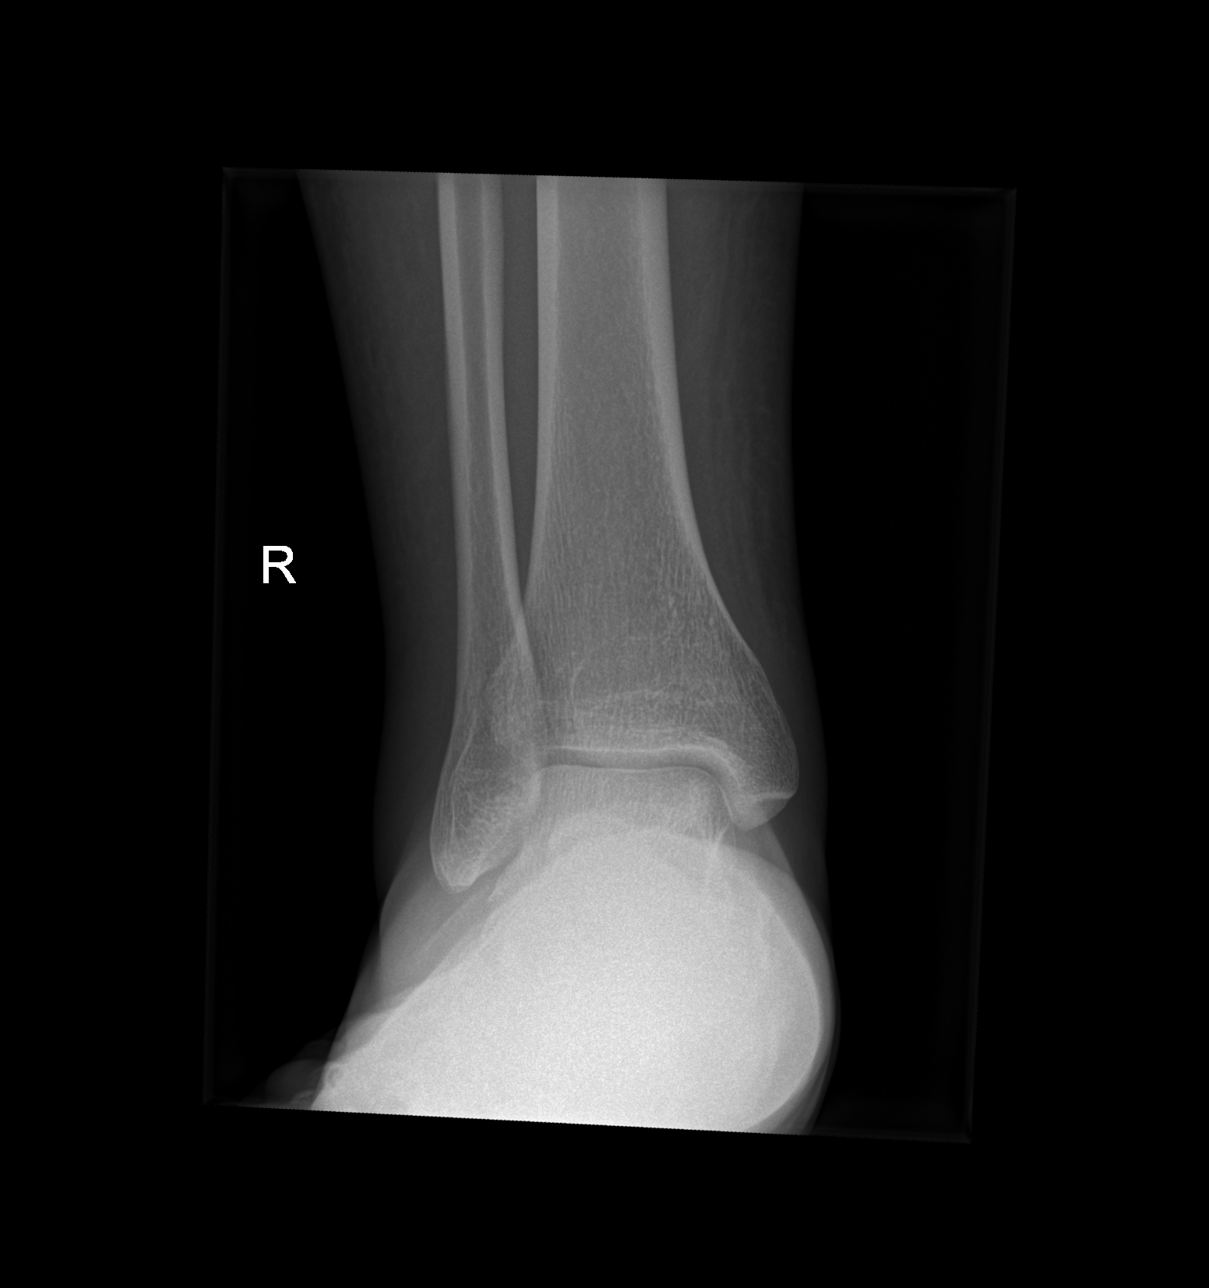

[x ankle obl right]
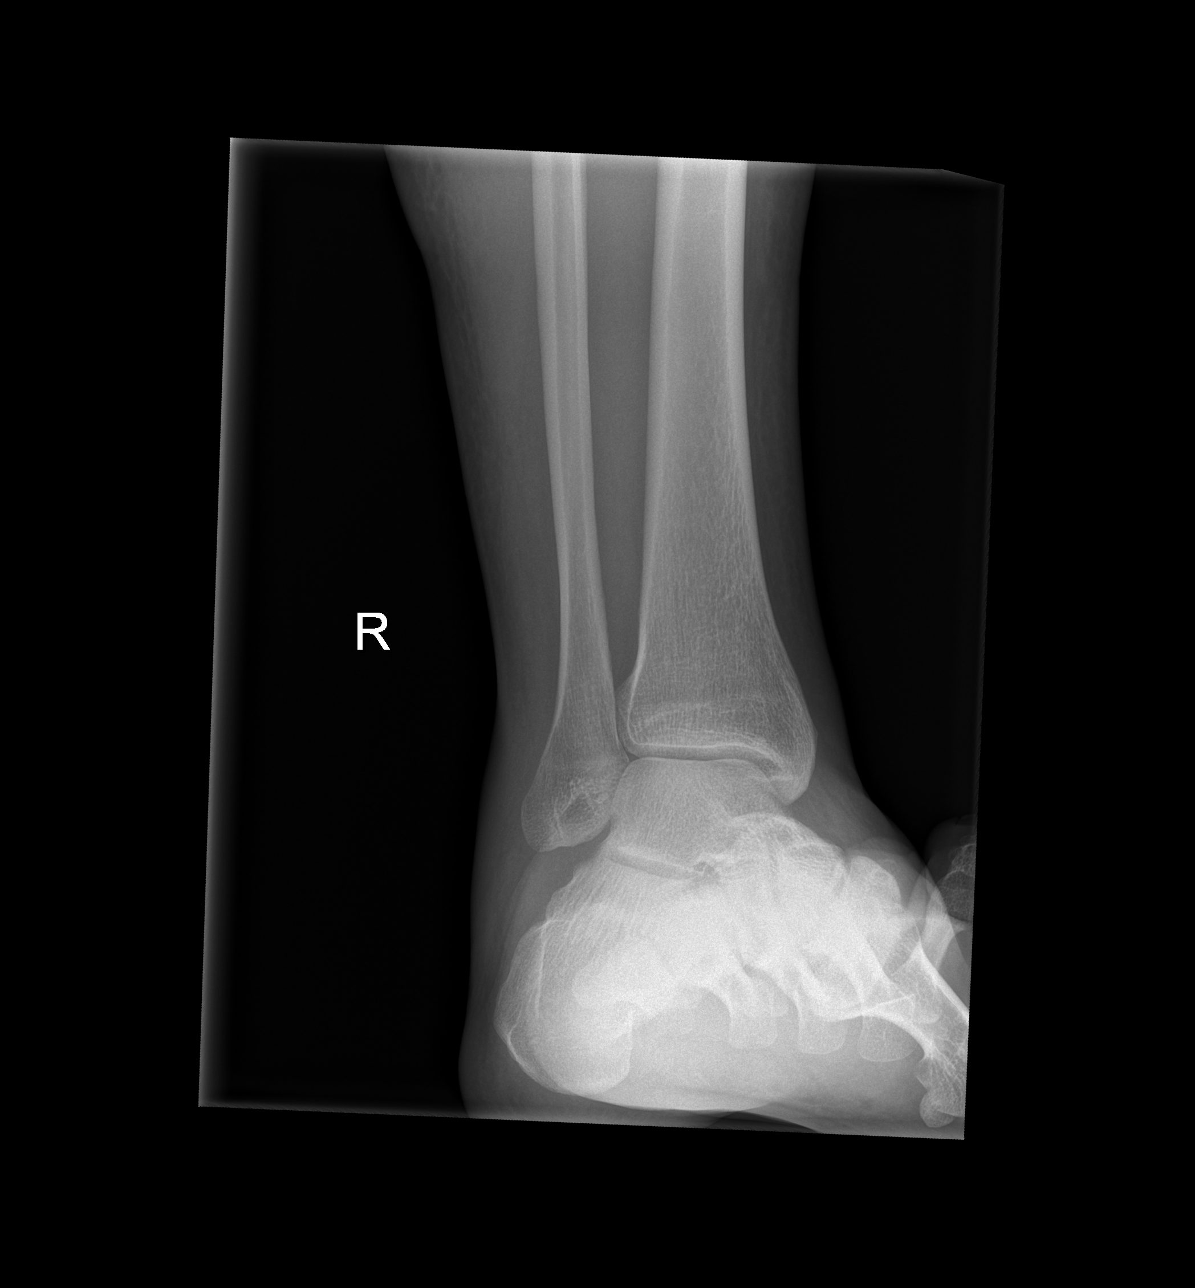

[x ankle lat right]
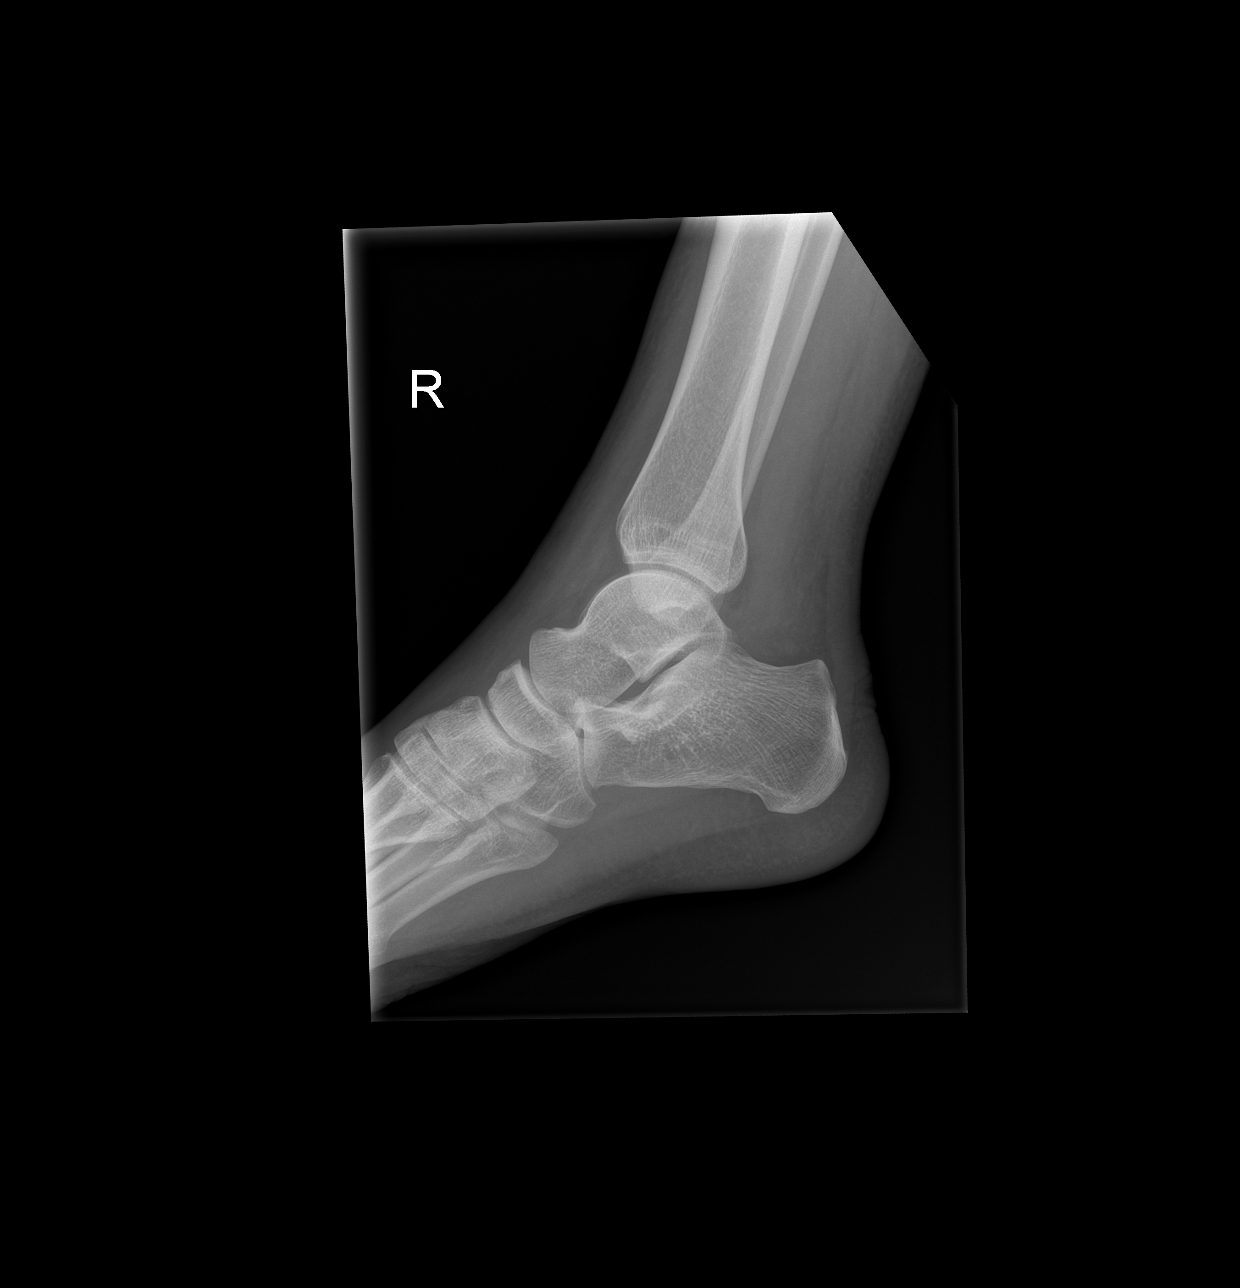

[3 of 3 positions shown; findings below may reference images not displayed]

FINDINGS: Mild-to-moderate lateral malleolar soft tissue swelling. The ankle
mortise is symmetric and intact. Joint spaces are preserved. No
acute fracture or dislocation.
IMPRESSION: Mild-to-moderate lateral malleolar soft tissue swelling without
acute fracture seen.

## 2021-12-01 IMAGING — CR DG FOOT COMPLETE 3+V*R*
3 series · 3 of 3 positions shown · non-contrast
Comparison: None Available.

CLINICAL DATA: Pain and swelling.

EXAM:
RIGHT FOOT COMPLETE - 3+ VIEW

[x foot ap right]
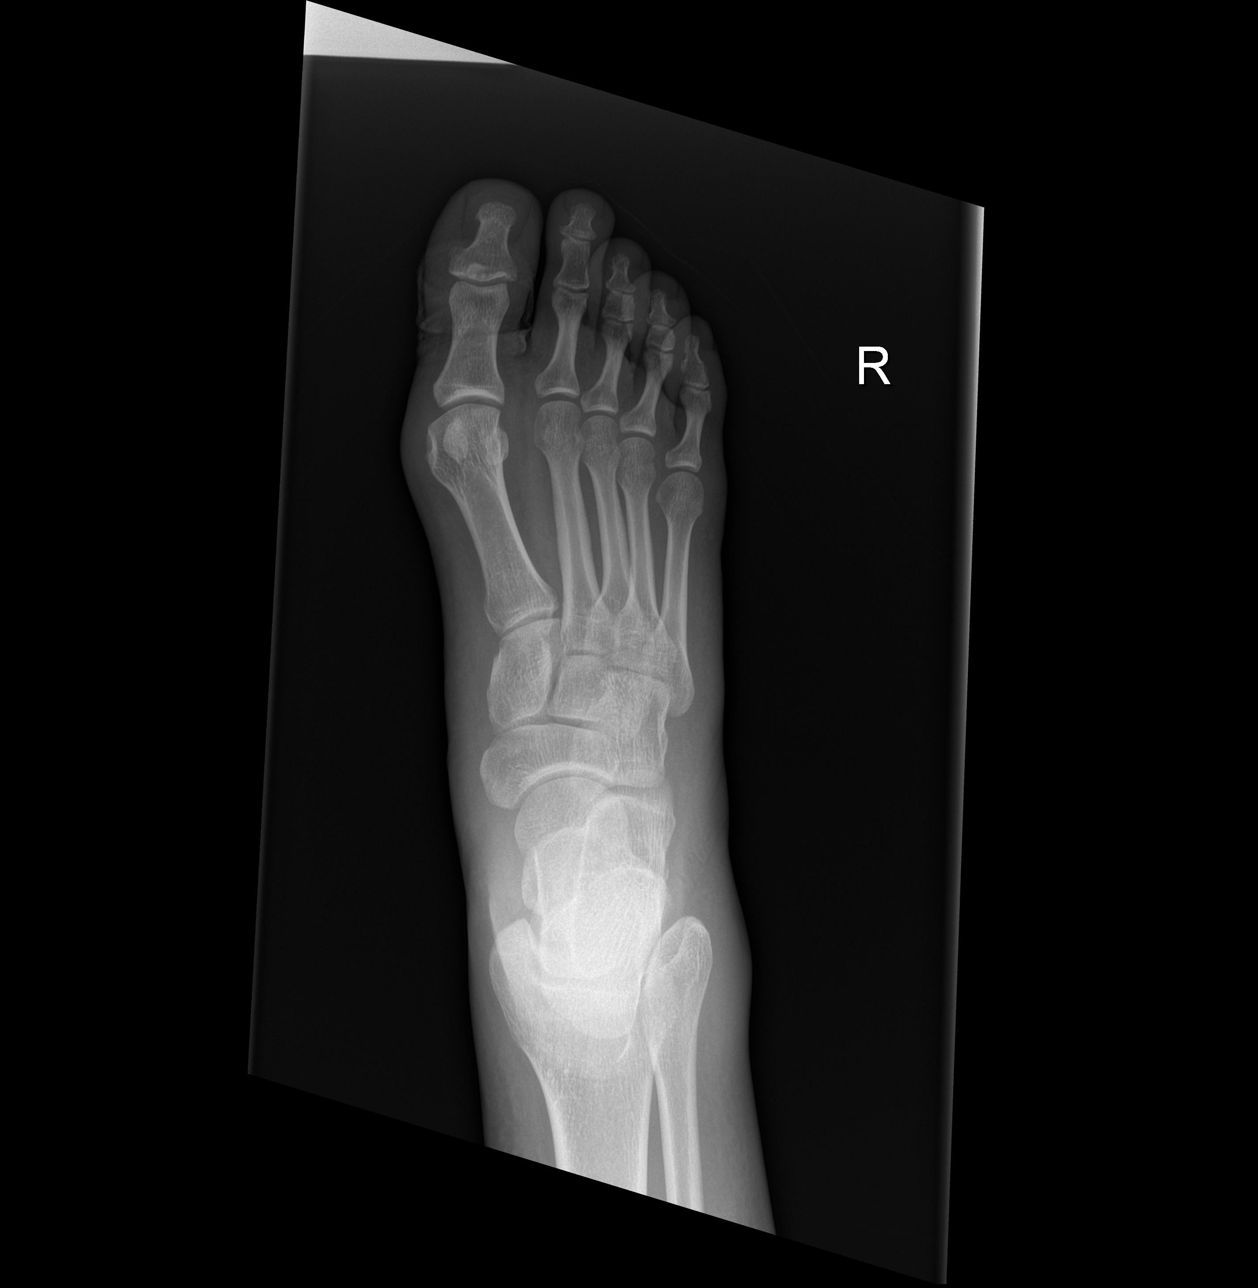

[x foot obl right]
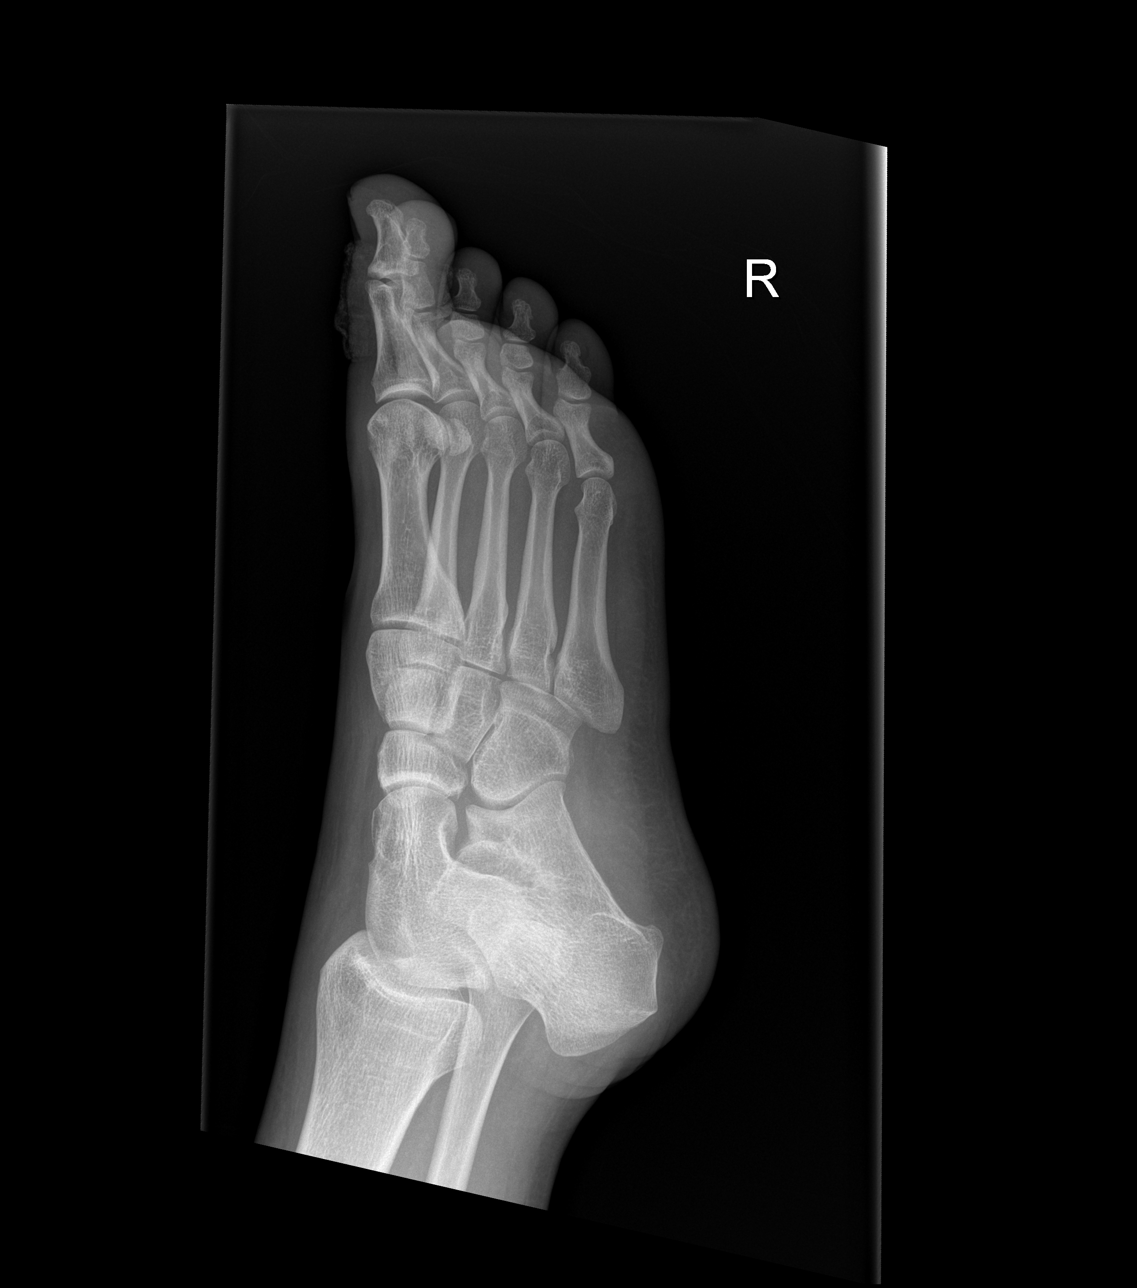

[x foot lat right]
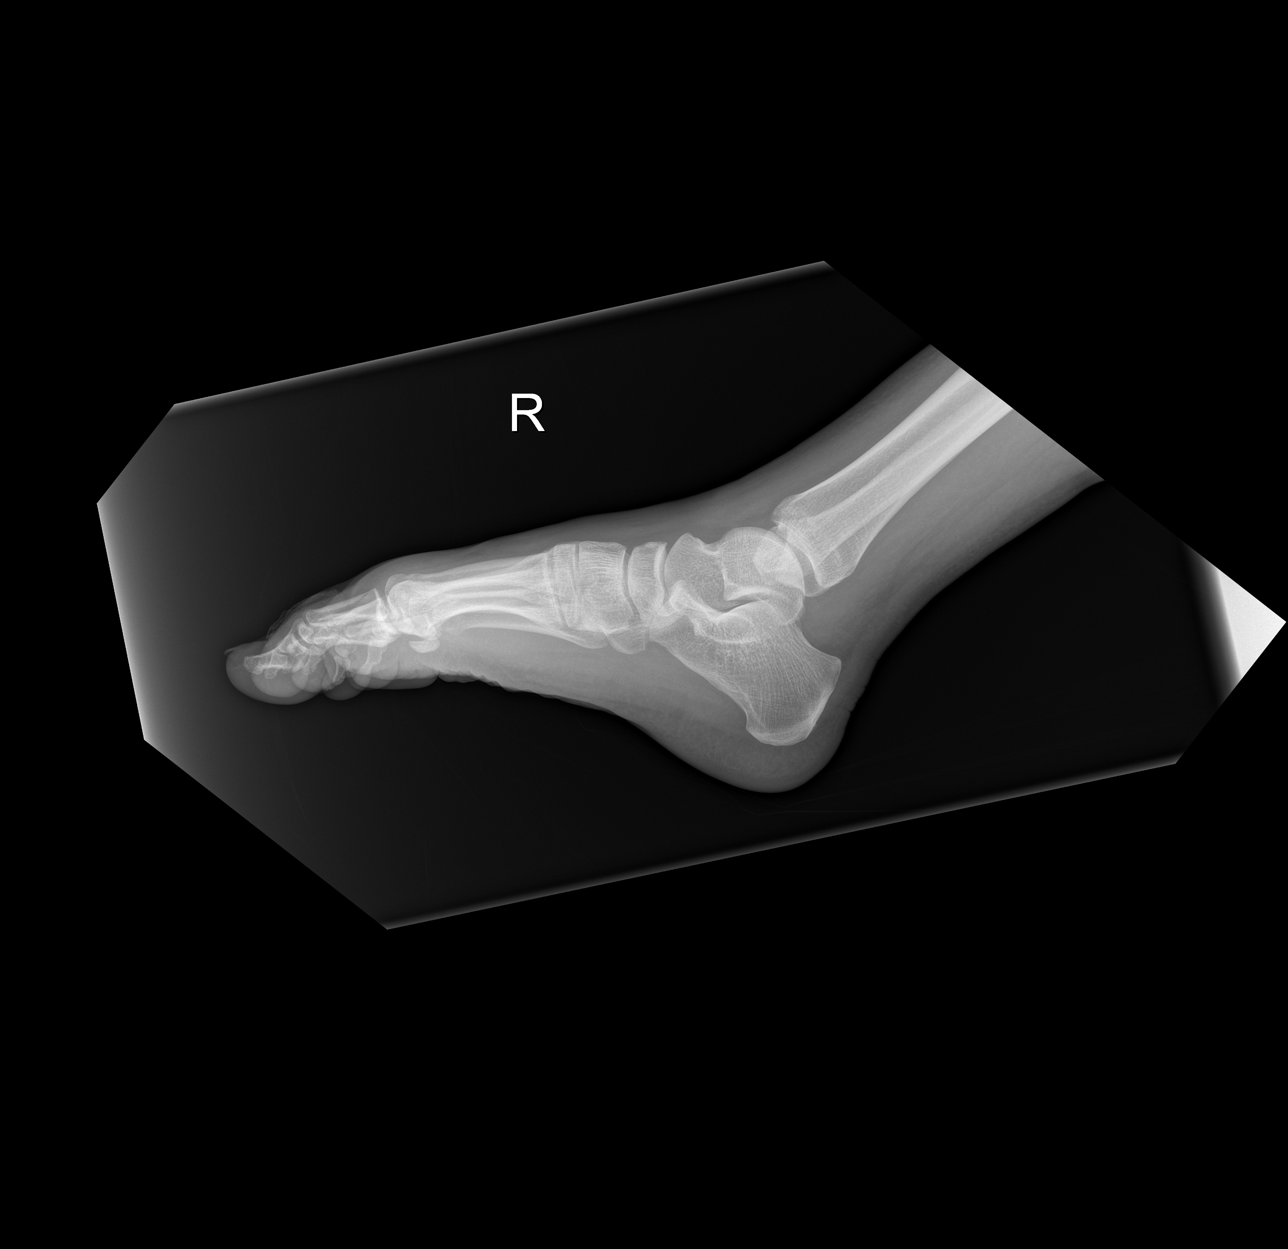

[3 of 3 positions shown; findings below may reference images not displayed]

FINDINGS: There is no evidence of fracture or dislocation. There is no
evidence of arthropathy. A benign appearing 4 mm sclerotic focus is
seen within the base of the distal phalanx of the right great toe.
Soft tissue structures are unremarkable.
IMPRESSION: No acute osseous abnormality.

## 2021-12-01 NOTE — ED Provider Triage Note (Signed)
Emergency Medicine Provider Triage Evaluation Note ? ?Ruben [name] , a 31 y.o. male  was evaluated in triage.  Pt complains of chest pain and shortness of breath x 4 hours. Was seen several days ago for anxiety with self inflicted wounds and concern for infection. Was given fluids and antibiotics for skin infection. Was discharged with outpatient resources for polysubstance abuse. He is concerned he was not adequately treated for infection. He feels as though his "kidneys are in agony". Denies alcohol or drug use today. States "I have a blood infection and I'm going to die".  ? ?Review of Systems  ?Positive: Chest pain, SOB, skin lesions, R foot pain ?Negative: Cough, fever  ? ?Physical Exam  ?BP 103/83 (BP Location: Left Arm)   Pulse (!) 123   Temp 98.2 ?F (36.8 ?C) (Oral)   Resp 18   SpO2 100%  ?Gen:   Awake, no distress   ?Resp:  Normal effort  ?MSK:   Moves extremities without difficulty  ?Other:  Slurring words, appears intoxicated. Multiple skin lesions over face and neck. Erythematous right foot ? ?Medical Decision Making  ?Medically screening exam initiated at 4:44 PM.  Appropriate orders placed.  Ruben [name] was informed that the remainder of the evaluation will be completed by another provider, this initial triage assessment does not replace that evaluation, and the importance of remaining in the ED until their evaluation is complete. ? ? ?  ?Ruben Monks, PA-C ?12/01/21 1651 ? ?

## 2021-12-01 NOTE — ED Triage Notes (Signed)
Patient c/o chest pain and SOB x 4 hours. Patient has sores to his face. Patient states that he recently broke up with his girlfriend and had pimples on his face and he popped the pimples and is left with sore.  ?Patient states that he does not do drugs. ?

## 2021-12-02 ENCOUNTER — Emergency Department (HOSPITAL_BASED_OUTPATIENT_CLINIC_OR_DEPARTMENT_OTHER): Payer: Self-pay

## 2021-12-02 ENCOUNTER — Other Ambulatory Visit: Payer: Self-pay

## 2021-12-02 ENCOUNTER — Emergency Department (HOSPITAL_COMMUNITY)
Admission: EM | Admit: 2021-12-02 | Discharge: 2021-12-02 | Disposition: A | Discharge status: Home / Self Care | Payer: Self-pay | Attending: Emergency Medicine | Admitting: Emergency Medicine

## 2021-12-02 ENCOUNTER — Encounter (HOSPITAL_COMMUNITY): Payer: Self-pay

## 2021-12-02 DIAGNOSIS — R600 Localized edema: Secondary | ICD-10-CM | POA: Insufficient documentation

## 2021-12-02 DIAGNOSIS — L0100 Impetigo, unspecified: Secondary | ICD-10-CM | POA: Insufficient documentation

## 2021-12-02 DIAGNOSIS — R000 Tachycardia, unspecified: Secondary | ICD-10-CM | POA: Insufficient documentation

## 2021-12-02 DIAGNOSIS — M7989 Other specified soft tissue disorders: Secondary | ICD-10-CM

## 2021-12-02 DIAGNOSIS — R609 Edema, unspecified: Secondary | ICD-10-CM

## 2021-12-02 LAB — RAPID URINE DRUG SCREEN, HOSP PERFORMED
Amphetamines: NOT DETECTED
Barbiturates: NOT DETECTED
Benzodiazepines: POSITIVE — AB
Cocaine: POSITIVE — AB
Opiates: NOT DETECTED
Tetrahydrocannabinol: POSITIVE — AB

## 2021-12-02 LAB — COMPREHENSIVE METABOLIC PANEL
ALT: 34 U/L (ref 0–44)
AST: 39 U/L (ref 15–41)
Albumin: 3.7 g/dL (ref 3.5–5.0)
Alkaline Phosphatase: 85 U/L (ref 38–126)
Anion gap: 8 (ref 5–15)
BUN: 7 mg/dL (ref 6–20)
CO2: 27 mmol/L (ref 22–32)
Calcium: 9 mg/dL (ref 8.9–10.3)
Chloride: 101 mmol/L (ref 98–111)
Creatinine, Ser: 0.83 mg/dL (ref 0.61–1.24)
GFR, Estimated: >60 mL/min (ref >60)
Glucose, Bld: 86 mg/dL (ref 70–99)
Potassium: 3.5 mmol/L (ref 3.5–5.1)
Sodium: 136 mmol/L (ref 135–145)
Total Bilirubin: 0.5 mg/dL (ref 0.3–1.2)
Total Protein: 7.1 g/dL (ref 6.5–8.1)

## 2021-12-02 LAB — CBC WITH DIFFERENTIAL/PLATELET
Abs Immature Granulocytes: 0.01 10*3/uL (ref 0.00–0.07)
Basophils Absolute: 0 10*3/uL (ref 0.0–0.1)
Basophils Relative: 0 %
Eosinophils Absolute: 0.4 10*3/uL (ref 0.0–0.5)
Eosinophils Relative: 6 %
HCT: 34 % — ABNORMAL LOW (ref 39.0–52.0)
Hemoglobin: 11.8 g/dL — ABNORMAL LOW (ref 13.0–17.0)
Immature Granulocytes: 0 %
Lymphocytes Relative: 23 %
Lymphs Abs: 1.4 10*3/uL (ref 0.7–4.0)
MCH: 31.4 pg (ref 26.0–34.0)
MCHC: 34.7 g/dL (ref 30.0–36.0)
MCV: 90.4 fL (ref 80.0–100.0)
Monocytes Absolute: 0.7 10*3/uL (ref 0.1–1.0)
Monocytes Relative: 12 %
Neutro Abs: 3.4 10*3/uL (ref 1.7–7.7)
Neutrophils Relative %: 59 %
Platelets: 334 10*3/uL (ref 150–400)
RBC: 3.76 MIL/uL — ABNORMAL LOW (ref 4.22–5.81)
RDW: 12.4 % (ref 11.5–15.5)
WBC: 6 10*3/uL (ref 4.0–10.5)
nRBC: 0 % (ref 0.0–0.2)

## 2021-12-02 LAB — LACTIC ACID, PLASMA: Lactic Acid, Venous: 0.9 mmol/L (ref 0.5–1.9)

## 2021-12-02 LAB — URINALYSIS, ROUTINE W REFLEX MICROSCOPIC
Bilirubin Urine: NEGATIVE
Glucose, UA: NEGATIVE mg/dL
Ketones, ur: NEGATIVE mg/dL
Leukocytes,Ua: NEGATIVE
Nitrite: NEGATIVE
Protein, ur: NEGATIVE mg/dL
Specific Gravity, Urine: 1.008 (ref 1.005–1.030)
pH: 6 (ref 5.0–8.0)

## 2021-12-02 LAB — ETHANOL: Alcohol, Ethyl (B): <10 mg/dL (ref <10)

## 2021-12-02 MED ORDER — HYDROXYZINE HCL 25 MG PO TABS
25.0000 mg | ORAL_TABLET | Freq: Once | ORAL | Status: DC
Start: 2021-12-02 — End: 2021-12-02

## 2021-12-02 MED ORDER — MUPIROCIN CALCIUM 2 % EX CREA: 1.0000 "application " | TOPICAL_CREAM | Freq: Two times a day (BID) | CUTANEOUS | 3 refills | Status: DC

## 2021-12-02 NOTE — ED Provider Notes (Signed)
?[facility]-EMERGENCY DEPT ?Provider Note ? ? ?CSN: [id number] ?Arrival date & time: 12/02/21  1751 ? ?  ? ?History ? ?Chief Complaint  ?Patient presents with  ? Foot Swelling  ? Leg Swelling  ? ? ?Ruben [name] is a 31 y.o. male. ? ?31 year old male with past medical history of polysubstance abuse presents emergency room with concern for swelling to both of his legs/feet with redness of the feet.  Patient is concerned he has an infection, has multiple wounds to face, arms, legs from skin picking which he states is an outlet for his anxiety.  Patient was recently admitted to behavioral health, and states he needs not had any drugs or alcohol since discharge.  Patient was seen in this ED 4 days ago with concern for redness on his foot, was discharged with mupirocin.  States has been taking his Xanax as needed but does not feel this is helping, also taking clonidine, Seroquel, Zoloft. Denies fevers, chills, chest pain, abdominal pain- although becomes increasingly anxious as further questions are asked. ? ? ?  ? ?Home Medications ?Prior to Admission medications   ?Medication Sig Start Date End Date Taking? Authorizing Provider  ?albuterol (VENTOLIN HFA) 108 (90 Base) MCG/ACT inhaler Inhale 1 puff into the lungs every 6 (six) hours as needed for wheezing or shortness of breath.    [provider]  ?gabapentin (NEURONTIN) 300 MG capsule Take 1 capsule (300 mg total) by mouth at bedtime. ?Patient taking differently: Take 300 mg by mouth at bedtime as needed (for anxiety and insomnia). 11/18/21   Karsten Ro, MD  ?Multiple Vitamin (MULTIVITAMIN WITH MINERALS) TABS tablet Take 1 tablet by mouth daily. 11/19/21   Karsten Ro, MD  ?mupirocin cream (BACTROBAN) 2 % Apply 1 application. topically 2 (two) times daily. 12/02/21   Jeannie Fend, PA-C  ?nicotine polacrilex (NICORETTE) 2 MG gum Take 1 each (2 mg total) by mouth as needed for smoking cessation. ?Patient not taking: Reported on  11/28/2021 11/18/21   Karsten Ro, MD  ?QUEtiapine (SEROQUEL) 50 MG tablet Take 1 tablet (50 mg total) by mouth at bedtime. 11/18/21   Karsten Ro, MD  ?sertraline (ZOLOFT) 100 MG tablet Take 1 tablet (100 mg total) by mouth daily. 11/19/21   Karsten Ro, MD  ?sulfamethoxazole-trimethoprim (BACTRIM DS) 800-160 MG tablet Take 1 tablet by mouth 2 (two) times daily for 7 days. 11/28/21 12/05/21  Sponseller, Lupe Carney R, PA-C  ?thiamine 100 MG tablet Take 1 tablet (100 mg total) by mouth daily. 11/19/21   Karsten Ro, MD  ?   ? ?Allergies    ?Abilify [aripiprazole]   ? ?Review of Systems   ?Review of Systems ?Negative except as per HPI ?Physical Exam ?Updated Vital Signs ?BP 117/80   Pulse (!) 59   Temp 98.6 ?F (37 ?C) (Oral)   Resp 16   SpO2 100%  ?Physical Exam ?Vitals and nursing note reviewed.  ?Constitutional:   ?   General: He is not in acute distress. ?   Appearance: He is well-developed. He is not diaphoretic.  ?HENT:  ?   Head: Normocephalic and atraumatic.  ?   Mouth/Throat:  ?   Mouth: Mucous membranes are moist.  ?Cardiovascular:  ?   Rate and Rhythm: Regular rhythm. Tachycardia present.  ?   Pulses: Normal pulses.  ?Pulmonary:  ?   Effort: Pulmonary effort is normal.  ?Abdominal:  ?   Palpations: Abdomen is soft.  ?   Tenderness: There is no abdominal tenderness.  ?  Musculoskeletal:  ?   Cervical back: Neck supple.  ?Skin: ?   General: Skin is warm and dry.  ?   Findings: Erythema and lesion present.  ?   Comments: Swelling, no pitting, bilateral lower extremities, mild erythema. Numerous scabbed lessions to face, extremities   ?Neurological:  ?   Mental Status: He is alert and oriented to person, place, and time.  ?   Sensory: No sensory deficit.  ?   Motor: No weakness.  ?Psychiatric:     ?   Mood and Affect: Mood is anxious.     ?   Behavior: Behavior is hyperactive.  ? ? ?ED Results / Procedures / Treatments   ?Labs ?(all labs ordered are listed, but only abnormal results are displayed) ?Labs Reviewed   ?CBC WITH DIFFERENTIAL/PLATELET - Abnormal; Notable for the following components:  ?    Result Value  ? RBC 3.76 (*)   ? Hemoglobin 11.8 (*)   ? HCT 34.0 (*)   ? All other components within normal limits  ?CULTURE, BLOOD (ROUTINE X 2)  ?CULTURE, BLOOD (ROUTINE X 2)  ?COMPREHENSIVE METABOLIC PANEL  ?LACTIC ACID, PLASMA  ?ETHANOL  ?URINALYSIS, ROUTINE W REFLEX MICROSCOPIC  ?RAPID URINE DRUG SCREEN, HOSP PERFORMED  ? ? ?EKG ?EKG Interpretation ? ?Date/Time:  Wednesday Dec 02 2021 07:27:01 EDT ?Ventricular Rate:  93 ?PR Interval:  179 ?QRS Duration: 110 ?QT Interval:  389 ?QTC Calculation: 484 ?R Axis:   92 ?Text Interpretation: Sinus rhythm Left posterior fascicular block ST elev, probable normal early repol pattern Borderline prolonged QT interval No significant change since last tracing Confirmed by Lorre NickAllen, Anthony ([id number]) on 12/02/2021 9:53:41 AM ? ?Radiology ?DG Chest 2 View ? ?Result Date: 12/01/2021 ?CLINICAL DATA:  Chest pain and shortness of breath. EXAM: CHEST - 2 VIEW COMPARISON:  November 12, 2021 FINDINGS: The heart size and mediastinal contours are within normal limits. Both lungs are clear. The visualized skeletal structures are unremarkable. IMPRESSION: No active cardiopulmonary disease. Electronically Signed   By: Aram Candelahaddeus  Houston M.D.   On: 12/01/2021 17:12  ? ?DG Ankle Complete Right ? ?Result Date: 12/01/2021 ?CLINICAL DATA:  Pain and swelling. EXAM: RIGHT ANKLE - COMPLETE 3+ VIEW COMPARISON:  None Available. FINDINGS: Mild-to-moderate lateral malleolar soft tissue swelling. The ankle mortise is symmetric and intact. Joint spaces are preserved. No acute fracture or dislocation. IMPRESSION: Mild-to-moderate lateral malleolar soft tissue swelling without acute fracture seen. Electronically Signed   By: Neita Garnetonald  Viola M.D.   On: 12/01/2021 17:15  ? ?DG Foot Complete Right ? ?Result Date: 12/01/2021 ?CLINICAL DATA:  Pain and swelling. EXAM: RIGHT FOOT COMPLETE - 3+ VIEW COMPARISON:  None Available. FINDINGS: There  is no evidence of fracture or dislocation. There is no evidence of arthropathy. A benign appearing 4 mm sclerotic focus is seen within the base of the distal phalanx of the right great toe. Soft tissue structures are unremarkable. IMPRESSION: No acute osseous abnormality. Electronically Signed   By: Aram Candelahaddeus  Houston M.D.   On: 12/01/2021 17:14   ? ?Procedures ?Procedures  ? ? ?Medications Ordered in ED ?Medications  ?hydrOXYzine (ATARAX) tablet 25 mg (has no administration in time range)  ? ? ?ED Course/ Medical Decision Making/ A&P ?  ?                        ?Medical Decision Making ?Amount and/or Complexity of Data Reviewed ?Labs: ordered. ? ?Risk ?Prescription drug management. ? ? ?This patient presents to the  ED for concern of lower extremity swelling with pain, skin picking, this involves an extensive number of treatment options, and is a complaint that carries with it a high risk of complications and morbidity.  The differential diagnosis includes but not limited to electrolyte abnormality, impetigo, cellulitis, renal failure, DVT ? ? ?Co morbidities that complicate the patient evaluation ? ?Poly substance abuse, anxiety ? ? ?Additional history obtained: ? ?External records from outside source obtained and reviewed including prior EKGs on file, no significant change, prior labs, recent elevated lactic  ? ? ?Lab Tests: ? ?I Ordered, and personally interpreted labs.  The pertinent results include: CBC with normal white blood cell count, CMP with normal renal and hepatic function, normal electrolytes.  Lactic acid reassuring at 0.9.  Alcohol is negative.  Urinalysis and UDS pending at discharge however not felt relevant to patient's care in light of available labs as listed above ? ? ?Imaging Studies ordered: ? ?I ordered imaging studies including venous Doppler bilateral lower extremities ?Shows edema, negative for DVT ? ? ?Cardiac Monitoring: / EKG: ? ?The patient was maintained on a cardiac monitor.  I  personally viewed and interpreted the cardiac monitored which showed an underlying rhythm of: sinus tach, improved to sinus. EKG compared to EKGs on file, no acute changes ? ? ?Problem List / ED Course / Critical in

## 2021-12-02 NOTE — Progress Notes (Signed)
BLE venous duplex has been completed.  Preliminary results messaged to Dr. Freida Busman via secure chat. ? ?Results can be found under chart review under CV PROC. ?12/02/2021 11:19 AM ?Monzerrath Mcburney RVT, RDMS ? ?

## 2021-12-02 NOTE — Discharge Instructions (Addendum)
Continue with your oral antibiotic- this is also treating any skin infection. ?Continue with your antibiotic ointment- clean area and then apply each time. I have sent in refills for this medication as it may take a while to clear the skin sores. ? ?You can wear compression socks for the leg swelling, elevate legs and limit sodium intake. Recheck with your doctor.  ?

## 2021-12-02 NOTE — ED Triage Notes (Signed)
Pt reports with foot swelling and leg swelling that has been going on for a while.  ?

## 2021-12-07 LAB — CULTURE, BLOOD (ROUTINE X 2)
Culture: NO GROWTH
Culture: NO GROWTH
Special Requests: ADEQUATE

## 2022-11-03 ENCOUNTER — Ambulatory Visit (HOSPITAL_COMMUNITY)
Admission: EM | Admit: 2022-11-03 | Discharge: 2022-11-03 | Disposition: A | Discharge status: Home / Self Care | Payer: BC Managed Care – PPO | Attending: Psychiatry | Admitting: Psychiatry

## 2022-11-03 DIAGNOSIS — Z789 Other specified health status: Secondary | ICD-10-CM

## 2022-11-03 DIAGNOSIS — Z760 Encounter for issue of repeat prescription: Secondary | ICD-10-CM

## 2022-11-03 MED ORDER — QUETIAPINE FUMARATE 100 MG PO TABS: 100.0000 mg | ORAL_TABLET | Freq: Every day | ORAL | Status: DC

## 2022-11-03 MED ORDER — PROPRANOLOL HCL ER 80 MG PO CP24: 80.0000 mg | ORAL_CAPSULE | Freq: Every day | ORAL | 0 refills | Status: AC

## 2022-11-03 MED ORDER — PROPRANOLOL HCL ER 80 MG PO CP24: 80.0000 mg | ORAL_CAPSULE | Freq: Every day | ORAL | Status: DC

## 2022-11-03 MED ORDER — LAMOTRIGINE 25 MG PO TABS: 50.0000 mg | ORAL_TABLET | Freq: Every day | ORAL | 0 refills | Status: AC

## 2022-11-03 MED ORDER — LAMOTRIGINE 25 MG PO TABS: 50.0000 mg | ORAL_TABLET | Freq: Every day | ORAL | Status: DC

## 2022-11-03 MED ORDER — QUETIAPINE FUMARATE 100 MG PO TABS
100.0000 mg | ORAL_TABLET | Freq: Every day | ORAL | 0 refills | Status: AC
Start: 2022-11-04 — End: 2022-12-04

## 2022-11-03 NOTE — Discharge Instructions (Signed)

## 2022-11-03 NOTE — ED Notes (Signed)
Patient discharged with written and verbal instructions by provider Elvin So, NP. Resources given.

## 2022-11-03 NOTE — ED Provider Notes (Signed)
Behavioral Health Urgent Care Medical Screening Exam  Patient Name: Ruben [name] MRN: [id number] Date of Evaluation: 11/03/22 Chief Complaint: "I just moved from [place] to here and I need a refill on my medications" Diagnosis:  Final diagnoses:  Encounter for medication refill  Need for community resource   History of Present illness: Ruben [name] is a 32 y.o. male. Pt presents voluntarily to [facility] behavioral health for walk-in assessment. Pt is assessed face-to-face by nurse practitioner.   Ruben [name], 32 y.o., male patient seen face to face by this provider; and chart reviewed on 11/03/22.  On evaluation, when asked reason for presenting today, Ruben [name] reports "I just moved from [place] to here and I need a refill on my medications."   Pt reports he was in [place] for the past 7 to 8 months and moved here "abruptly" due to family issues. He reports he was prescribed Klonopin 1mg , Lamictal 50mg , Propanolol 80mg , and Seroquel 100mg . He states he has been medication compliant for the past 7 to 8 months. He states he was previously prescribed Trileptal; however, he states he experienced muscle cramping and had to go to the emergency department. He states the only medications he has been taking have been the Klonopin, Lamictal, Propanolol, and Seroquel. He is requesting refills today.  Per pharmacy, able to see Elavil 10mg  at bedtime filled on 2/16; Lamictal 25mg  two daily filled on 2/29; Trileptal 300mg  three times daily filled on 2/27; Inderal LA 80mg  daily filled on 2/16, Seroquel 100mg  at bedtime filled on 2/29. No records of Klonopin since 2023.   Spoke w/ pt who is adamant he only takes Klonopin, Lamictal, Propanolol and Seroquel. Has been taking daily as prescribed. Discussed will not be able to provide Klonopin prescription. However will be able to provide 30 day prescriptions of Lamictal, Propanolol, and Seroquel. Pt verbalized understanding.  Discussed risks with Lamictal that if he misses several days of medication, will have to start at lower dose and increase gradually, discussed risk of Katherina Right Syndrome. Pt already aware and verbalized understanding.  Pt denies suicidal, homicidal or violent ideations. He denies auditory visual hallucinations or paranoia.  He reports he is not connected with counseling or medication management in [place], was connected in [place].  While preparing prescriptions, was informed pt is no longer in facility. Called pt at [phone]. Pt reports he left because he had another appointment. He states while waiting he called [facility] and was able to get an appointment. He states he will follow up with [facility] for medication management. No prescriptions provided today.  [place] ED from 11/03/2022 in [facility] ED from 12/02/2021 in [facility] Emergency Department at [facility] ED from 12/01/2021 in [facility] Emergency Department at [place] No Risk No Risk No Risk       Psychiatric Specialty Exam  Presentation  General Appearance:Appropriate for Environment; Casual; Fairly Groomed  Eye Contact:Fair  Speech:Clear and Coherent; Normal Rate  Speech Volume:Normal  Handedness:Right   Mood and Affect  Mood: Anxious  Affect: Congruent   Thought Process  Thought Processes: Coherent; Goal Directed; Linear  Descriptions of Associations:Intact  Orientation:Full (Time, Place and Person)  Thought Content:Logical    Hallucinations:None  Ideas of Reference:None  Suicidal Thoughts:No  Homicidal Thoughts:No   Sensorium  Memory: Immediate Good; Recent Good; Remote Good  Judgment: Fair  Insight: Fair   Community education officer  Concentration: Fair  Attention Span: Fair  Recall: [employer] of Knowledge: Fair  Language: Fair   Psychomotor Activity  Psychomotor  Activity: Normal   Assets  Assets: Armed forces logistics/support/administrative officer; Desire for Improvement; Financial Resources/Insurance; Housing; Resilience; Transportation   Sleep  Sleep: Fair  Number of hours:  0 (Not charted)   Physical Exam: Physical Exam Constitutional:      General: He is not in acute distress.    Appearance: He is not ill-appearing, toxic-appearing or diaphoretic.  Eyes:     General: No scleral icterus. Cardiovascular:     Rate and Rhythm: Normal rate.  Pulmonary:     Effort: Pulmonary effort is normal. No respiratory distress.  Skin:    General: Skin is warm and dry.  Neurological:     Mental Status: He is alert and oriented to person, place, and time.  Psychiatric:        Attention and Perception: Attention and perception normal.        Mood and Affect: Mood is anxious.        Speech: Speech normal.        Behavior: Behavior normal. Behavior is cooperative.        Thought Content: Thought content normal.        Cognition and Memory: Cognition and memory normal.        Judgment: Judgment normal.    Review of Systems  Constitutional:  Negative for chills and fever.  Respiratory:  Negative for shortness of breath.   Cardiovascular:  Negative for chest pain and palpitations.  Gastrointestinal:  Negative for abdominal pain.  Neurological:  Negative for headaches.  Psychiatric/Behavioral:  The patient is nervous/anxious.    Blood pressure (!) 139/92, pulse 86, temperature 97.8 F (36.6 C), temperature source Oral, resp. rate 16, SpO2 100 %. There is no height or weight on file to calculate BMI.  Musculoskeletal: Strength & Muscle Tone: within normal limits Gait & Station: normal Patient leans: N/A   [facility] MSE Discharge Disposition for Follow up and Recommendations: Based on my evaluation the patient does not appear to have an emergency medical condition and can be discharged with resources and follow up care in outpatient services for Medication Management and  Individual Therapy   Tharon Aquas, NP 11/03/2022, 1:15 PM

## 2022-11-03 NOTE — Progress Notes (Signed)
   11/03/22 1211  [place] (Walk-ins at [facility] only)  How Did You Hear About [place]? Self  What Is the Reason for Your Visit/Call Today? ROUTINE: Ruben [name] is a 32 y/o male that presents to the [facility] as a walk-in. He is dx's with GAD, Bipolar II Disorder, depression, and PTSD. No significant symptoms. However, patient does report some anxiety related symptoms. He is here today requesting prescription refills stating that he ran out of medications today. He is prescribed Lamictal 100 mg's, Klonopin 1 mg (PRN), Seroquel 100 mg, and Propanolol 100 mg's XR). However, he is asking for 80 mg's of Propanolol today. The medications were previously prescribed by Dr. Venia Carbon in [place]. He was living in [place] 7-8 months. Returned to [place], yesterday, to live. Because he no longer lives in [place] he would like his medications refilled. He tried to find a psychiatrist but says the wait list is 30 days or longer. No SI, HI, and AVH's. Denies alcohol/drug use. Hx of inpatient psychiatric admissions (last admission March 2023). Lives alone. He has family support.  How Long Has This Been Causing You Problems? > than 6 months  Have You Recently Had Any Thoughts About Hurting Yourself? No  Are You Planning to Commit Suicide/Harm Yourself At This time? No  Have you Recently Had Thoughts About [place]? No  Are You Planning To Harm Someone At This Time? No  Explanation: Patient denies  Are you currently experiencing any auditory, visual or other hallucinations? No  Have You Used Any Alcohol or Drugs in the Past 24 Hours? No  What Did You Use and How Much? Patient denies.  Do you have any current medical co-morbidities that require immediate attention? No  Clinician description of patient physical appearance/behavior: Patient is calm and cooperative.  What Do You Feel Would Help You the Most Today? Medication(s);Treatment for Depression or other mood problem;Stress Management   Determination of Need Routine (7 days)  Options For Referral Medication Management;Outpatient Therapy

## 2023-01-31 ENCOUNTER — Emergency Department (HOSPITAL_COMMUNITY): Payer: BC Managed Care – PPO

## 2023-01-31 ENCOUNTER — Emergency Department (HOSPITAL_COMMUNITY)
Admission: EM | Admit: 2023-01-31 | Discharge: 2023-01-31 | Disposition: A | Discharge status: Home / Self Care | Payer: BC Managed Care – PPO | Attending: Emergency Medicine | Admitting: Emergency Medicine

## 2023-01-31 DIAGNOSIS — R918 Other nonspecific abnormal finding of lung field: Secondary | ICD-10-CM | POA: Diagnosis not present

## 2023-01-31 DIAGNOSIS — R109 Unspecified abdominal pain: Secondary | ICD-10-CM | POA: Insufficient documentation

## 2023-01-31 LAB — CBC WITH DIFFERENTIAL/PLATELET
Abs Immature Granulocytes: 0.04 10*3/uL (ref 0.00–0.07)
Basophils Absolute: 0.1 10*3/uL (ref 0.0–0.1)
Basophils Relative: 1 %
Eosinophils Absolute: 0.6 10*3/uL — ABNORMAL HIGH (ref 0.0–0.5)
Eosinophils Relative: 6 %
HCT: 43 % (ref 39.0–52.0)
Hemoglobin: 14.5 g/dL (ref 13.0–17.0)
Immature Granulocytes: 0 %
Lymphocytes Relative: 25 %
Lymphs Abs: 2.5 10*3/uL (ref 0.7–4.0)
MCH: 30.7 pg (ref 26.0–34.0)
MCHC: 33.7 g/dL (ref 30.0–36.0)
MCV: 91.1 fL (ref 80.0–100.0)
Monocytes Absolute: 0.7 10*3/uL (ref 0.1–1.0)
Monocytes Relative: 7 %
Neutro Abs: 6.3 10*3/uL (ref 1.7–7.7)
Neutrophils Relative %: 61 %
Platelets: 305 10*3/uL (ref 150–400)
RBC: 4.72 MIL/uL (ref 4.22–5.81)
RDW: 13.1 % (ref 11.5–15.5)
WBC: 10.2 10*3/uL (ref 4.0–10.5)
nRBC: 0 % (ref 0.0–0.2)

## 2023-01-31 LAB — BASIC METABOLIC PANEL
Anion gap: 11 (ref 5–15)
BUN: 9 mg/dL (ref 6–20)
CO2: 23 mmol/L (ref 22–32)
Calcium: 9.2 mg/dL (ref 8.9–10.3)
Chloride: 105 mmol/L (ref 98–111)
Creatinine, Ser: 0.91 mg/dL (ref 0.61–1.24)
GFR, Estimated: >60 mL/min (ref >60)
Glucose, Bld: 91 mg/dL (ref 70–99)
Potassium: 3.1 mmol/L — ABNORMAL LOW (ref 3.5–5.1)
Sodium: 139 mmol/L (ref 135–145)

## 2023-01-31 LAB — URINALYSIS, ROUTINE W REFLEX MICROSCOPIC
Bacteria, UA: NONE SEEN
Bilirubin Urine: NEGATIVE
Glucose, UA: NEGATIVE mg/dL
Ketones, ur: NEGATIVE mg/dL
Leukocytes,Ua: NEGATIVE
Nitrite: NEGATIVE
Protein, ur: NEGATIVE mg/dL
Specific Gravity, Urine: 1.003 — ABNORMAL LOW (ref 1.005–1.030)
pH: 7 (ref 5.0–8.0)

## 2023-01-31 LAB — RAPID URINE DRUG SCREEN, HOSP PERFORMED
Amphetamines: POSITIVE — AB
Barbiturates: NOT DETECTED
Benzodiazepines: NOT DETECTED
Cocaine: NOT DETECTED
Opiates: POSITIVE — AB
Tetrahydrocannabinol: POSITIVE — AB

## 2023-01-31 MED ORDER — KETOROLAC TROMETHAMINE 30 MG/ML IJ SOLN
30.0000 mg | Freq: Once | INTRAMUSCULAR | Status: AC
  Administered 2023-01-31: 30 mg via INTRAVENOUS
  Filled 2023-01-31: qty 1

## 2023-01-31 MED ORDER — POTASSIUM CHLORIDE CRYS ER 20 MEQ PO TBCR
40.0000 meq | EXTENDED_RELEASE_TABLET | Freq: Once | ORAL | Status: AC
  Administered 2023-01-31: 40 meq via ORAL
  Filled 2023-01-31: qty 2

## 2023-01-31 NOTE — ED Notes (Signed)
Patient walked out of his room, yelling and cursing, demanding to leave. I entered his room, he began verbally berating me, telling me to take his IV out. When I asked him to calm down, he responded saying, "just do your f-ing job and shut your mouth". At this time, I walked out of his room. He began yelling and cursing louder, saying I had disrespected him by just walking out of his room. During this time, the provider had discharged him. I retrieved his paperwork while his IV was removed by K.Grant, RN

## 2023-01-31 NOTE — ED Notes (Signed)
Pt stated that he needed to go to the bathroom. Pt ambulated to the bathroom and to try and urinate. Pt was in bathroom for about 10 minutes and said he had a shy bladder and couldn't pee but felt like he needed to. Pt wanted to try for a few more minutes. Pt was given urine cup.

## 2023-01-31 NOTE — ED Triage Notes (Signed)
Patient arrived with left sided pain, declines radiation to abdomen but states it does go towards his back. Declines NV but endorses diarrhea. States took OTC medication with no relief.

## 2023-01-31 NOTE — ED Provider Notes (Signed)
[facility]-EMERGENCY DEPT [facility] Emergency Department Provider Note MRN:  [id number]  Arrival date & time: 01/31/23     Chief Complaint   Flank Pain   History of Present Illness   Ruben [name] is a 31 y.o. year-old male presents to the ED with chief complaint of left-sided flank pain that has been worsening over the past day.  He denies any hematuria or dysuria.  Denies nausea, vomiting, diarrhea, or constipation.  Denies any known injury.  He states that he has been drinking alcohol and using THC.  Also reports taking Adderall.  States that everything that is "in his system is illegal and prescribed."    History provided by patient.   Review of Systems  Pertinent positive and negative review of systems noted in HPI.    Physical Exam   Vitals:   01/31/23 0630 01/31/23 0700  BP: (!) 132/92 (!) 131/93  Pulse: 97 100  Resp:  18  Temp:    SpO2: 98% 100%    CONSTITUTIONAL:  intoxicated-appearing, NAD NEURO:  Alert and oriented x 3, CN 3-12 grossly intact EYES:  eyes equal and reactive ENT/NECK:  Supple, no stridor  CARDIO:  tachycardic, regular rhythm, appears well-perfused  PULM:  No respiratory distress,  GI/GU:  non-distended, left flank pain MSK/SPINE:  No gross deformities, no edema, moves all extremities  SKIN:  no rash, atraumatic   *Additional and/or pertinent findings included in MDM below  Diagnostic and Interventional Summary    EKG Interpretation Date/Time:    Ventricular Rate:    PR Interval:    QRS Duration:    QT Interval:    QTC Calculation:   R Axis:      Text Interpretation:         Labs Reviewed  URINALYSIS, ROUTINE W REFLEX MICROSCOPIC - Abnormal; Notable for the following components:      Result Value   Color, Urine STRAW (*)    Specific Gravity, Urine 1.003 (*)    Hgb urine dipstick MODERATE (*)    All other components within normal limits  RAPID URINE DRUG SCREEN, HOSP PERFORMED - Abnormal; Notable for the following  components:   Opiates POSITIVE (*)    Amphetamines POSITIVE (*)    Tetrahydrocannabinol POSITIVE (*)    All other components within normal limits  CBC WITH DIFFERENTIAL/PLATELET - Abnormal; Notable for the following components:   Eosinophils Absolute 0.6 (*)    All other components within normal limits  BASIC METABOLIC PANEL - Abnormal; Notable for the following components:   Potassium 3.1 (*)    All other components within normal limits    CT Renal Stone Study  Final Result      Medications  potassium chloride SA (KLOR-CON M) CR tablet 40 mEq (40 mEq Oral Given 01/31/23 0608)  ketorolac (TORADOL) 30 MG/ML injection 30 mg (30 mg Intravenous Given 01/31/23 1191)     Procedures  /  Critical Care Procedures  ED Course and Medical Decision Making  I have reviewed the triage vital signs, the nursing notes, and pertinent available records from the EMR.  Social Determinants Affecting Complexity of Care: Patient has no clinically significant social determinants affecting this chief complaint..   ED Course:    Medical Decision Making Patient here with sudden onset and worsening left flank pain.  Concern for KS.  Will check labs and CT.  Patient seems intoxicated.  CT negative for stone.  Possible findings suggestive of IBS.  Scattered lung nodules noted.  Amount and/or  Complexity of Data Reviewed Labs: ordered.    Details: No leukocytosis to suggest infection Mild hypokalemia to 3.1, supplemented in ED UA and UDS pending. Radiology: ordered and independent interpretation performed.    Details: No stone seen on CT  Risk Prescription drug management.         Consultants: No consultations were needed in caring for this patient.   Treatment and Plan: Patient signed out to oncoming team.    Final Clinical Impressions(s) / ED Diagnoses     ICD-10-CM   1. Flank pain  R10.9     2. Lung nodules  R91.8       ED Discharge Orders     None         Discharge  Instructions Discussed with and Provided to Patient:     Discharge Instructions      Your CT scan showed no kidney stones.  You had some fatty deposits in your intestines that can be indicative of irritable bowel disease. Follow-up with your doctor.  You have a few lung nodules seen on CT, please follow-up with your doctor.       Roxy Horseman, PA-C 01/31/23 2159    Palumbo, April, MD 02/03/23 2328

## 2023-01-31 NOTE — ED Provider Notes (Signed)
Care assumed from [place], [place] at shift change.  Please see their note for further information.  Briefly: Patient with left-sided flank pain x 1 day.  No associated symptoms.  No history of similar symptoms previously.  DDx: Kidney stone, pyelonephritis, muscle strain  Plan: CT renal does not show a stone.  K 3.1, given oral potassium for same.  No leukocytosis.  UA and UDS pending at shift change.  If UA shows infection, would treat.   0730: UA shows moderate hgb, noninfectious.  UDS positive for opiates THC and amphetamines.  Suspect patient likely passed a stone prior to imaging given his hematuria.  Discussed with patient is understanding and in agreement. Evaluation and diagnostic testing in the emergency department does not suggest an emergent condition requiring admission or immediate intervention beyond what has been performed at this time.  Plan for discharge with close PCP follow-up.  Patient is understanding and amenable with plan, educated on red flag symptoms that would prompt immediate return.  Patient discharged in stable condition.    Silva Bandy, PA-C 01/31/23 1610    Rexford Maus, DO 01/31/23 1554

## 2023-01-31 NOTE — Discharge Instructions (Signed)
Your CT scan showed no kidney stones.  You had some fatty deposits in your intestines that can be indicative of irritable bowel disease. Follow-up with your doctor.  You have a few lung nodules seen on CT, please follow-up with your doctor.
